# Patient Record
Sex: Female | Born: 1950
Health system: Southern US, Community
[De-identification: ages and names within clinical notes are randomized; demographics above are authoritative.]

## PROBLEM LIST (undated history)

## (undated) DIAGNOSIS — I82409 Acute embolism and thrombosis of unspecified deep veins of unspecified lower extremity: Secondary | ICD-10-CM

## (undated) DIAGNOSIS — M1990 Unspecified osteoarthritis, unspecified site: Secondary | ICD-10-CM

## (undated) DIAGNOSIS — I2699 Other pulmonary embolism without acute cor pulmonale: Secondary | ICD-10-CM

## (undated) DIAGNOSIS — E079 Disorder of thyroid, unspecified: Secondary | ICD-10-CM

## (undated) HISTORY — PX: CHOLECYSTECTOMY: SHX55

## (undated) HISTORY — PX: APPENDECTOMY: SHX54

## (undated) HISTORY — PX: OVARY SURGERY: SHX727

## (undated) HISTORY — PX: BACK SURGERY: SHX140

---

## 1998-06-15 ENCOUNTER — Ambulatory Visit (HOSPITAL_COMMUNITY): Admission: RE | Admit: 1998-06-15 | Discharge: 1998-06-15 | Payer: Self-pay | Admitting: *Deleted

## 1999-02-11 ENCOUNTER — Observation Stay (HOSPITAL_COMMUNITY): Admission: RE | Admit: 1999-02-11 | Discharge: 1999-02-12 | Payer: Self-pay | Admitting: *Deleted

## 1999-02-11 ENCOUNTER — Encounter (INDEPENDENT_AMBULATORY_CARE_PROVIDER_SITE_OTHER): Payer: Self-pay

## 2008-08-26 ENCOUNTER — Encounter
Admission: RE | Admit: 2008-08-26 | Discharge: 2008-08-26 | Payer: Self-pay | Admitting: Physical Medicine and Rehabilitation

## 2008-08-28 ENCOUNTER — Emergency Department (HOSPITAL_COMMUNITY): Admission: EM | Admit: 2008-08-28 | Discharge: 2008-08-29 | Payer: Self-pay | Admitting: Emergency Medicine

## 2013-12-16 DIAGNOSIS — E559 Vitamin D deficiency, unspecified: Secondary | ICD-10-CM | POA: Insufficient documentation

## 2013-12-16 DIAGNOSIS — L900 Lichen sclerosus et atrophicus: Secondary | ICD-10-CM | POA: Insufficient documentation

## 2013-12-16 DIAGNOSIS — M810 Age-related osteoporosis without current pathological fracture: Secondary | ICD-10-CM

## 2013-12-16 HISTORY — DX: Age-related osteoporosis without current pathological fracture: M81.0

## 2013-12-16 HISTORY — DX: Vitamin D deficiency, unspecified: E55.9

## 2013-12-16 HISTORY — DX: Lichen sclerosus et atrophicus: L90.0

## 2014-05-24 DIAGNOSIS — R030 Elevated blood-pressure reading, without diagnosis of hypertension: Secondary | ICD-10-CM

## 2014-05-24 DIAGNOSIS — K219 Gastro-esophageal reflux disease without esophagitis: Secondary | ICD-10-CM

## 2014-05-24 HISTORY — DX: Elevated blood-pressure reading, without diagnosis of hypertension: R03.0

## 2014-05-24 HISTORY — DX: Gastro-esophageal reflux disease without esophagitis: K21.9

## 2014-12-21 DIAGNOSIS — G8929 Other chronic pain: Secondary | ICD-10-CM | POA: Insufficient documentation

## 2015-03-15 DIAGNOSIS — R300 Dysuria: Secondary | ICD-10-CM | POA: Diagnosis not present

## 2015-05-28 DIAGNOSIS — J209 Acute bronchitis, unspecified: Secondary | ICD-10-CM | POA: Diagnosis not present

## 2015-05-28 DIAGNOSIS — J0100 Acute maxillary sinusitis, unspecified: Secondary | ICD-10-CM | POA: Diagnosis not present

## 2015-05-28 DIAGNOSIS — R069 Unspecified abnormalities of breathing: Secondary | ICD-10-CM | POA: Diagnosis not present

## 2015-06-30 DIAGNOSIS — R05 Cough: Secondary | ICD-10-CM | POA: Diagnosis not present

## 2015-06-30 DIAGNOSIS — E785 Hyperlipidemia, unspecified: Secondary | ICD-10-CM | POA: Diagnosis not present

## 2015-06-30 DIAGNOSIS — E079 Disorder of thyroid, unspecified: Secondary | ICD-10-CM | POA: Diagnosis not present

## 2015-06-30 DIAGNOSIS — R51 Headache: Secondary | ICD-10-CM | POA: Diagnosis not present

## 2015-06-30 DIAGNOSIS — R531 Weakness: Secondary | ICD-10-CM | POA: Diagnosis not present

## 2015-06-30 DIAGNOSIS — M810 Age-related osteoporosis without current pathological fracture: Secondary | ICD-10-CM | POA: Diagnosis not present

## 2015-06-30 DIAGNOSIS — R0789 Other chest pain: Secondary | ICD-10-CM | POA: Diagnosis not present

## 2015-06-30 DIAGNOSIS — K219 Gastro-esophageal reflux disease without esophagitis: Secondary | ICD-10-CM | POA: Diagnosis not present

## 2015-06-30 DIAGNOSIS — R079 Chest pain, unspecified: Secondary | ICD-10-CM | POA: Diagnosis not present

## 2015-06-30 DIAGNOSIS — Z79899 Other long term (current) drug therapy: Secondary | ICD-10-CM | POA: Diagnosis not present

## 2015-08-01 DIAGNOSIS — G8929 Other chronic pain: Secondary | ICD-10-CM | POA: Diagnosis not present

## 2015-08-01 DIAGNOSIS — E039 Hypothyroidism, unspecified: Secondary | ICD-10-CM | POA: Diagnosis not present

## 2015-08-01 DIAGNOSIS — R000 Tachycardia, unspecified: Secondary | ICD-10-CM

## 2015-08-01 DIAGNOSIS — M545 Low back pain: Secondary | ICD-10-CM | POA: Diagnosis not present

## 2015-08-01 DIAGNOSIS — R0609 Other forms of dyspnea: Secondary | ICD-10-CM

## 2015-08-01 DIAGNOSIS — R002 Palpitations: Secondary | ICD-10-CM | POA: Diagnosis not present

## 2015-08-01 DIAGNOSIS — R0600 Dyspnea, unspecified: Secondary | ICD-10-CM

## 2015-08-01 DIAGNOSIS — E559 Vitamin D deficiency, unspecified: Secondary | ICD-10-CM | POA: Diagnosis not present

## 2015-08-01 DIAGNOSIS — R350 Frequency of micturition: Secondary | ICD-10-CM | POA: Diagnosis not present

## 2015-08-01 HISTORY — DX: Tachycardia, unspecified: R00.0

## 2015-08-01 HISTORY — DX: Other forms of dyspnea: R06.09

## 2015-08-01 HISTORY — DX: Dyspnea, unspecified: R06.00

## 2015-09-19 DIAGNOSIS — R0609 Other forms of dyspnea: Secondary | ICD-10-CM | POA: Diagnosis not present

## 2015-09-19 DIAGNOSIS — R002 Palpitations: Secondary | ICD-10-CM | POA: Diagnosis not present

## 2015-09-24 DIAGNOSIS — R002 Palpitations: Secondary | ICD-10-CM | POA: Diagnosis not present

## 2015-10-09 DIAGNOSIS — R002 Palpitations: Secondary | ICD-10-CM | POA: Diagnosis not present

## 2015-10-09 DIAGNOSIS — R0609 Other forms of dyspnea: Secondary | ICD-10-CM | POA: Diagnosis not present

## 2015-10-25 DIAGNOSIS — J189 Pneumonia, unspecified organism: Secondary | ICD-10-CM | POA: Diagnosis not present

## 2015-10-25 DIAGNOSIS — R911 Solitary pulmonary nodule: Secondary | ICD-10-CM | POA: Insufficient documentation

## 2015-10-25 DIAGNOSIS — R000 Tachycardia, unspecified: Secondary | ICD-10-CM | POA: Diagnosis not present

## 2015-10-25 HISTORY — DX: Solitary pulmonary nodule: R91.1

## 2015-10-30 DIAGNOSIS — R300 Dysuria: Secondary | ICD-10-CM | POA: Diagnosis not present

## 2015-10-30 DIAGNOSIS — J209 Acute bronchitis, unspecified: Secondary | ICD-10-CM | POA: Diagnosis not present

## 2015-10-30 DIAGNOSIS — E039 Hypothyroidism, unspecified: Secondary | ICD-10-CM | POA: Diagnosis not present

## 2015-10-30 DIAGNOSIS — M7989 Other specified soft tissue disorders: Secondary | ICD-10-CM | POA: Diagnosis not present

## 2015-11-01 DIAGNOSIS — R0789 Other chest pain: Secondary | ICD-10-CM | POA: Insufficient documentation

## 2015-11-01 DIAGNOSIS — R000 Tachycardia, unspecified: Secondary | ICD-10-CM | POA: Diagnosis not present

## 2015-11-01 DIAGNOSIS — R002 Palpitations: Secondary | ICD-10-CM | POA: Insufficient documentation

## 2015-11-01 HISTORY — DX: Palpitations: R00.2

## 2015-11-01 HISTORY — DX: Other chest pain: R07.89

## 2015-11-06 DIAGNOSIS — R000 Tachycardia, unspecified: Secondary | ICD-10-CM | POA: Diagnosis not present

## 2015-11-06 DIAGNOSIS — E039 Hypothyroidism, unspecified: Secondary | ICD-10-CM | POA: Diagnosis not present

## 2015-11-06 DIAGNOSIS — S93601S Unspecified sprain of right foot, sequela: Secondary | ICD-10-CM | POA: Diagnosis not present

## 2015-11-06 DIAGNOSIS — R911 Solitary pulmonary nodule: Secondary | ICD-10-CM | POA: Diagnosis not present

## 2015-11-27 DIAGNOSIS — J0100 Acute maxillary sinusitis, unspecified: Secondary | ICD-10-CM | POA: Diagnosis not present

## 2015-11-29 DIAGNOSIS — Z9049 Acquired absence of other specified parts of digestive tract: Secondary | ICD-10-CM | POA: Diagnosis not present

## 2015-11-29 DIAGNOSIS — R911 Solitary pulmonary nodule: Secondary | ICD-10-CM | POA: Diagnosis not present

## 2015-11-29 DIAGNOSIS — I700 Atherosclerosis of aorta: Secondary | ICD-10-CM | POA: Diagnosis not present

## 2015-11-29 DIAGNOSIS — J9811 Atelectasis: Secondary | ICD-10-CM | POA: Diagnosis not present

## 2015-11-29 DIAGNOSIS — R05 Cough: Secondary | ICD-10-CM | POA: Diagnosis not present

## 2015-11-29 DIAGNOSIS — R0600 Dyspnea, unspecified: Secondary | ICD-10-CM | POA: Diagnosis not present

## 2015-11-29 DIAGNOSIS — R0689 Other abnormalities of breathing: Secondary | ICD-10-CM | POA: Diagnosis not present

## 2015-11-29 DIAGNOSIS — L905 Scar conditions and fibrosis of skin: Secondary | ICD-10-CM | POA: Diagnosis not present

## 2015-11-29 DIAGNOSIS — R918 Other nonspecific abnormal finding of lung field: Secondary | ICD-10-CM | POA: Diagnosis not present

## 2015-12-05 DIAGNOSIS — J42 Unspecified chronic bronchitis: Secondary | ICD-10-CM | POA: Diagnosis not present

## 2015-12-06 DIAGNOSIS — J410 Simple chronic bronchitis: Secondary | ICD-10-CM | POA: Diagnosis not present

## 2015-12-06 DIAGNOSIS — K219 Gastro-esophageal reflux disease without esophagitis: Secondary | ICD-10-CM | POA: Diagnosis not present

## 2015-12-06 DIAGNOSIS — R05 Cough: Secondary | ICD-10-CM | POA: Diagnosis not present

## 2015-12-06 DIAGNOSIS — R000 Tachycardia, unspecified: Secondary | ICD-10-CM | POA: Diagnosis not present

## 2015-12-06 DIAGNOSIS — R0789 Other chest pain: Secondary | ICD-10-CM | POA: Diagnosis not present

## 2015-12-07 DIAGNOSIS — R05 Cough: Secondary | ICD-10-CM | POA: Diagnosis not present

## 2015-12-07 DIAGNOSIS — J410 Simple chronic bronchitis: Secondary | ICD-10-CM | POA: Diagnosis not present

## 2015-12-07 DIAGNOSIS — J4521 Mild intermittent asthma with (acute) exacerbation: Secondary | ICD-10-CM | POA: Diagnosis not present

## 2015-12-07 DIAGNOSIS — J309 Allergic rhinitis, unspecified: Secondary | ICD-10-CM | POA: Diagnosis not present

## 2016-01-02 DIAGNOSIS — R000 Tachycardia, unspecified: Secondary | ICD-10-CM | POA: Diagnosis not present

## 2016-01-02 DIAGNOSIS — K219 Gastro-esophageal reflux disease without esophagitis: Secondary | ICD-10-CM | POA: Diagnosis not present

## 2016-01-02 DIAGNOSIS — R05 Cough: Secondary | ICD-10-CM | POA: Diagnosis not present

## 2016-01-02 DIAGNOSIS — R0789 Other chest pain: Secondary | ICD-10-CM | POA: Diagnosis not present

## 2016-01-02 DIAGNOSIS — J410 Simple chronic bronchitis: Secondary | ICD-10-CM | POA: Diagnosis not present

## 2016-01-05 DIAGNOSIS — J42 Unspecified chronic bronchitis: Secondary | ICD-10-CM | POA: Diagnosis not present

## 2016-02-04 DIAGNOSIS — J42 Unspecified chronic bronchitis: Secondary | ICD-10-CM | POA: Diagnosis not present

## 2016-03-06 DIAGNOSIS — J42 Unspecified chronic bronchitis: Secondary | ICD-10-CM | POA: Diagnosis not present

## 2016-04-06 DIAGNOSIS — J42 Unspecified chronic bronchitis: Secondary | ICD-10-CM | POA: Diagnosis not present

## 2016-05-01 DIAGNOSIS — L821 Other seborrheic keratosis: Secondary | ICD-10-CM | POA: Diagnosis not present

## 2016-05-04 DIAGNOSIS — J42 Unspecified chronic bronchitis: Secondary | ICD-10-CM | POA: Diagnosis not present

## 2016-05-10 DIAGNOSIS — R062 Wheezing: Secondary | ICD-10-CM | POA: Diagnosis not present

## 2016-05-10 DIAGNOSIS — J0100 Acute maxillary sinusitis, unspecified: Secondary | ICD-10-CM | POA: Diagnosis not present

## 2016-05-10 DIAGNOSIS — J218 Acute bronchiolitis due to other specified organisms: Secondary | ICD-10-CM | POA: Diagnosis not present

## 2016-05-28 DIAGNOSIS — H4303 Vitreous prolapse, bilateral: Secondary | ICD-10-CM | POA: Diagnosis not present

## 2016-06-04 DIAGNOSIS — J42 Unspecified chronic bronchitis: Secondary | ICD-10-CM | POA: Diagnosis not present

## 2016-06-27 DIAGNOSIS — M545 Low back pain: Secondary | ICD-10-CM | POA: Diagnosis not present

## 2016-06-27 DIAGNOSIS — M79641 Pain in right hand: Secondary | ICD-10-CM | POA: Diagnosis not present

## 2016-06-27 DIAGNOSIS — M79642 Pain in left hand: Secondary | ICD-10-CM | POA: Diagnosis not present

## 2016-06-27 DIAGNOSIS — G8929 Other chronic pain: Secondary | ICD-10-CM | POA: Diagnosis not present

## 2016-06-27 DIAGNOSIS — H43811 Vitreous degeneration, right eye: Secondary | ICD-10-CM | POA: Diagnosis not present

## 2016-06-27 DIAGNOSIS — M549 Dorsalgia, unspecified: Secondary | ICD-10-CM | POA: Diagnosis not present

## 2016-06-27 DIAGNOSIS — Z1231 Encounter for screening mammogram for malignant neoplasm of breast: Secondary | ICD-10-CM | POA: Diagnosis not present

## 2016-06-27 DIAGNOSIS — E559 Vitamin D deficiency, unspecified: Secondary | ICD-10-CM | POA: Diagnosis not present

## 2016-06-27 DIAGNOSIS — S32009S Unspecified fracture of unspecified lumbar vertebra, sequela: Secondary | ICD-10-CM | POA: Diagnosis not present

## 2016-06-27 DIAGNOSIS — M85842 Other specified disorders of bone density and structure, left hand: Secondary | ICD-10-CM | POA: Diagnosis not present

## 2016-06-27 DIAGNOSIS — M810 Age-related osteoporosis without current pathological fracture: Secondary | ICD-10-CM | POA: Diagnosis not present

## 2016-07-04 DIAGNOSIS — J42 Unspecified chronic bronchitis: Secondary | ICD-10-CM | POA: Diagnosis not present

## 2016-07-15 DIAGNOSIS — H524 Presbyopia: Secondary | ICD-10-CM | POA: Diagnosis not present

## 2016-07-15 DIAGNOSIS — H4303 Vitreous prolapse, bilateral: Secondary | ICD-10-CM | POA: Diagnosis not present

## 2016-07-15 DIAGNOSIS — H2513 Age-related nuclear cataract, bilateral: Secondary | ICD-10-CM | POA: Diagnosis not present

## 2016-08-04 DIAGNOSIS — J42 Unspecified chronic bronchitis: Secondary | ICD-10-CM | POA: Diagnosis not present

## 2016-09-03 DIAGNOSIS — J42 Unspecified chronic bronchitis: Secondary | ICD-10-CM | POA: Diagnosis not present

## 2016-09-15 DIAGNOSIS — H4303 Vitreous prolapse, bilateral: Secondary | ICD-10-CM | POA: Diagnosis not present

## 2016-10-04 DIAGNOSIS — J42 Unspecified chronic bronchitis: Secondary | ICD-10-CM | POA: Diagnosis not present

## 2016-11-04 DIAGNOSIS — J42 Unspecified chronic bronchitis: Secondary | ICD-10-CM | POA: Diagnosis not present

## 2016-12-04 DIAGNOSIS — J42 Unspecified chronic bronchitis: Secondary | ICD-10-CM | POA: Diagnosis not present

## 2016-12-12 DIAGNOSIS — H81399 Other peripheral vertigo, unspecified ear: Secondary | ICD-10-CM | POA: Diagnosis not present

## 2016-12-18 DIAGNOSIS — J0100 Acute maxillary sinusitis, unspecified: Secondary | ICD-10-CM | POA: Diagnosis not present

## 2017-02-06 DIAGNOSIS — R5383 Other fatigue: Secondary | ICD-10-CM | POA: Diagnosis not present

## 2017-02-06 DIAGNOSIS — J40 Bronchitis, not specified as acute or chronic: Secondary | ICD-10-CM | POA: Diagnosis not present

## 2017-02-06 DIAGNOSIS — Z6839 Body mass index (BMI) 39.0-39.9, adult: Secondary | ICD-10-CM | POA: Diagnosis not present

## 2017-02-06 DIAGNOSIS — E039 Hypothyroidism, unspecified: Secondary | ICD-10-CM | POA: Diagnosis not present

## 2017-02-26 ENCOUNTER — Emergency Department (HOSPITAL_COMMUNITY)
Admit: 2017-02-26 | Discharge: 2017-02-26 | Disposition: A | Discharge status: Home / Self Care | Payer: PPO | Attending: Emergency Medicine | Admitting: Emergency Medicine

## 2017-02-26 ENCOUNTER — Other Ambulatory Visit: Payer: Self-pay

## 2017-02-26 ENCOUNTER — Emergency Department (HOSPITAL_COMMUNITY): Payer: PPO

## 2017-02-26 ENCOUNTER — Encounter (HOSPITAL_COMMUNITY): Payer: Self-pay

## 2017-02-26 ENCOUNTER — Inpatient Hospital Stay (HOSPITAL_COMMUNITY)
Admission: EM | Admit: 2017-02-26 | Discharge: 2017-02-28 | DRG: 176 | Disposition: A | Discharge status: Home / Self Care | Payer: PPO | Attending: Internal Medicine | Admitting: Internal Medicine

## 2017-02-26 DIAGNOSIS — R601 Generalized edema: Secondary | ICD-10-CM | POA: Diagnosis not present

## 2017-02-26 DIAGNOSIS — Z885 Allergy status to narcotic agent status: Secondary | ICD-10-CM | POA: Diagnosis not present

## 2017-02-26 DIAGNOSIS — Z880 Allergy status to penicillin: Secondary | ICD-10-CM | POA: Diagnosis not present

## 2017-02-26 DIAGNOSIS — M1990 Unspecified osteoarthritis, unspecified site: Secondary | ICD-10-CM | POA: Diagnosis present

## 2017-02-26 DIAGNOSIS — I361 Nonrheumatic tricuspid (valve) insufficiency: Secondary | ICD-10-CM | POA: Diagnosis not present

## 2017-02-26 DIAGNOSIS — E039 Hypothyroidism, unspecified: Secondary | ICD-10-CM | POA: Diagnosis not present

## 2017-02-26 DIAGNOSIS — I2699 Other pulmonary embolism without acute cor pulmonale: Secondary | ICD-10-CM | POA: Diagnosis not present

## 2017-02-26 DIAGNOSIS — R600 Localized edema: Secondary | ICD-10-CM | POA: Diagnosis not present

## 2017-02-26 DIAGNOSIS — Z23 Encounter for immunization: Secondary | ICD-10-CM | POA: Diagnosis not present

## 2017-02-26 DIAGNOSIS — Z79890 Hormone replacement therapy (postmenopausal): Secondary | ICD-10-CM

## 2017-02-26 DIAGNOSIS — R002 Palpitations: Secondary | ICD-10-CM | POA: Diagnosis present

## 2017-02-26 DIAGNOSIS — M79609 Pain in unspecified limb: Secondary | ICD-10-CM | POA: Diagnosis not present

## 2017-02-26 DIAGNOSIS — R05 Cough: Secondary | ICD-10-CM | POA: Diagnosis not present

## 2017-02-26 DIAGNOSIS — Z79899 Other long term (current) drug therapy: Secondary | ICD-10-CM

## 2017-02-26 DIAGNOSIS — Z888 Allergy status to other drugs, medicaments and biological substances status: Secondary | ICD-10-CM | POA: Diagnosis not present

## 2017-02-26 DIAGNOSIS — R0602 Shortness of breath: Secondary | ICD-10-CM | POA: Diagnosis not present

## 2017-02-26 DIAGNOSIS — Z7982 Long term (current) use of aspirin: Secondary | ICD-10-CM | POA: Diagnosis not present

## 2017-02-26 HISTORY — DX: Disorder of thyroid, unspecified: E07.9

## 2017-02-26 HISTORY — DX: Hypothyroidism, unspecified: E03.9

## 2017-02-26 HISTORY — DX: Unspecified osteoarthritis, unspecified site: M19.90

## 2017-02-26 LAB — BASIC METABOLIC PANEL
Anion gap: 10 (ref 5–15)
BUN: 8 mg/dL (ref 6–20)
CALCIUM: 9.2 mg/dL (ref 8.9–10.3)
CO2: 24 mmol/L (ref 22–32)
CREATININE: 0.9 mg/dL (ref 0.44–1.00)
Chloride: 105 mmol/L (ref 101–111)
GFR calc non Af Amer: >60 mL/min (ref >60)
GLUCOSE: 95 mg/dL (ref 65–99)
Potassium: 4.1 mmol/L (ref 3.5–5.1)
Sodium: 139 mmol/L (ref 135–145)

## 2017-02-26 LAB — CBC WITH DIFFERENTIAL/PLATELET
BASOS PCT: 0 %
Basophils Absolute: 0 10*3/uL (ref 0.0–0.1)
Eosinophils Absolute: 0.2 10*3/uL (ref 0.0–0.7)
Eosinophils Relative: 3 %
HEMATOCRIT: 41.4 % (ref 36.0–46.0)
Hemoglobin: 13.9 g/dL (ref 12.0–15.0)
Lymphocytes Relative: 31 %
Lymphs Abs: 2.7 10*3/uL (ref 0.7–4.0)
MCH: 29.8 pg (ref 26.0–34.0)
MCHC: 33.6 g/dL (ref 30.0–36.0)
MCV: 88.7 fL (ref 78.0–100.0)
MONO ABS: 0.6 10*3/uL (ref 0.1–1.0)
MONOS PCT: 7 %
NEUTROS ABS: 5.1 10*3/uL (ref 1.7–7.7)
Neutrophils Relative %: 59 %
Platelets: 228 10*3/uL (ref 150–400)
RBC: 4.67 MIL/uL (ref 3.87–5.11)
RDW: 13.8 % (ref 11.5–15.5)
WBC: 8.6 10*3/uL (ref 4.0–10.5)

## 2017-02-26 LAB — I-STAT TROPONIN, ED: Troponin i, poc: 0 ng/mL (ref 0.00–0.08)

## 2017-02-26 LAB — D-DIMER, QUANTITATIVE: D-Dimer, Quant: 10.85 ug/mL-FEU — ABNORMAL HIGH (ref 0.00–0.50)

## 2017-02-26 MED ORDER — METOPROLOL SUCCINATE ER 25 MG PO TB24
25.0000 mg | ORAL_TABLET | Freq: Every day | ORAL | Status: DC
  Administered 2017-02-27 (×2): 25 mg via ORAL
  Filled 2017-02-26 (×2): qty 1

## 2017-02-26 MED ORDER — ALBUTEROL SULFATE (2.5 MG/3ML) 0.083% IN NEBU: 2.5000 mg | INHALATION_SOLUTION | RESPIRATORY_TRACT | Status: DC | PRN

## 2017-02-26 MED ORDER — FLUTICASONE PROPIONATE 50 MCG/ACT NA SUSP: 2.0000 | Freq: Every evening | NASAL | Status: DC | PRN

## 2017-02-26 MED ORDER — MECLIZINE HCL 12.5 MG PO TABS: 25.0000 mg | ORAL_TABLET | Freq: Three times a day (TID) | ORAL | Status: DC | PRN

## 2017-02-26 MED ORDER — LEVOTHYROXINE SODIUM 50 MCG PO TABS
50.0000 ug | ORAL_TABLET | ORAL | Status: DC
  Administered 2017-02-27: 50 ug via ORAL
  Filled 2017-02-26: qty 1

## 2017-02-26 MED ORDER — IOPAMIDOL (ISOVUE-370) INJECTION 76%
INTRAVENOUS | Status: AC
  Administered 2017-02-26: 100 mL
  Filled 2017-02-26: qty 100

## 2017-02-26 MED ORDER — ACETAMINOPHEN 325 MG PO TABS
650.0000 mg | ORAL_TABLET | Freq: Four times a day (QID) | ORAL | Status: DC | PRN
  Administered 2017-02-27 (×2): 650 mg via ORAL
  Filled 2017-02-26 (×2): qty 2

## 2017-02-26 MED ORDER — HEPARIN BOLUS VIA INFUSION
4300.0000 [IU] | Freq: Once | INTRAVENOUS | Status: AC
  Administered 2017-02-26: 4300 [IU] via INTRAVENOUS
  Filled 2017-02-26: qty 4300

## 2017-02-26 MED ORDER — LEVOTHYROXINE SODIUM 75 MCG PO TABS
75.0000 ug | ORAL_TABLET | ORAL | Status: DC
  Administered 2017-02-28: 75 ug via ORAL
  Filled 2017-02-26: qty 1

## 2017-02-26 MED ORDER — HEPARIN (PORCINE) IN NACL 100-0.45 UNIT/ML-% IJ SOLN
1400.0000 [IU]/h | INTRAMUSCULAR | Status: DC
  Administered 2017-02-26 – 2017-02-28 (×3): 1400 [IU]/h via INTRAVENOUS
  Filled 2017-02-26 (×3): qty 250

## 2017-02-26 MED ORDER — INFLUENZA VAC SPLIT HIGH-DOSE 0.5 ML IM SUSY
0.5000 mL | PREFILLED_SYRINGE | INTRAMUSCULAR | Status: DC
  Filled 2017-02-26: qty 0.5

## 2017-02-26 MED ORDER — METHOCARBAMOL 500 MG PO TABS: 750.0000 mg | ORAL_TABLET | Freq: Three times a day (TID) | ORAL | Status: DC | PRN

## 2017-02-26 MED ORDER — PANTOPRAZOLE SODIUM 40 MG PO TBEC
40.0000 mg | DELAYED_RELEASE_TABLET | Freq: Every day | ORAL | Status: DC
  Administered 2017-02-27 – 2017-02-28 (×2): 40 mg via ORAL
  Filled 2017-02-26 (×2): qty 1

## 2017-02-26 MED ORDER — ONDANSETRON HCL 4 MG PO TABS: 4.0000 mg | ORAL_TABLET | Freq: Four times a day (QID) | ORAL | Status: DC | PRN

## 2017-02-26 MED ORDER — ACETAMINOPHEN 650 MG RE SUPP: 650.0000 mg | Freq: Four times a day (QID) | RECTAL | Status: DC | PRN

## 2017-02-26 MED ORDER — ONDANSETRON HCL 4 MG/2ML IJ SOLN: 4.0000 mg | Freq: Four times a day (QID) | INTRAMUSCULAR | Status: DC | PRN

## 2017-02-26 NOTE — ED Provider Notes (Signed)
[facility] EMERGENCY DEPARTMENT Provider Note   CSN: [id number] Arrival date & time: 02/26/17  1623   History   Chief Complaint Chief Complaint  Patient presents with  . Shortness of Breath    HPI Gail Olson is a 66 y.o. female.  The history is provided by the patient.  Shortness of Breath  This is a chronic problem. The average episode lasts 5 weeks. The problem occurs continuously.The problem has been gradually worsening. Associated symptoms include chest pain, leg pain and leg swelling. Pertinent negatives include no fever, no rhinorrhea, no sore throat, no cough, no vomiting, no abdominal pain and no rash. It is unknown what precipitated the problem. She has tried nothing for the symptoms. She has had prior ED visits. Associated medical issues do not include COPD, chronic lung disease, PE, CAD, heart failure, past MI, DVT or recent surgery.    Past Medical History:  Diagnosis Date  . Arthritis   . Thyroid disease     Patient Active Problem List   Diagnosis Date Noted  . Pulmonary embolism (HCC) 02/26/2017    Past Surgical History:  Procedure Laterality Date  . APPENDECTOMY    . BACK SURGERY    . CHOLECYSTECTOMY      OB History    No data available       Home Medications    Prior to Admission medications   Medication Sig Start Date End Date Taking? Authorizing Provider  acetaminophen (TYLENOL) 650 MG CR tablet Take 650 mg by mouth every 8 (eight) hours as needed for pain.   Yes [provider]  albuterol (PROAIR HFA) 108 (90 Base) MCG/ACT inhaler Inhale 2 puffs into the lungs every 6 (six) hours as needed for wheezing or shortness of breath.   Yes [provider]  albuterol (PROVENTIL) (2.5 MG/3ML) 0.083% nebulizer solution Take 2.5 mg by nebulization every 4 (four) hours as needed for wheezing or shortness of breath (AS DIRECTED BY MD).  12/04/15  Yes [provider]  aspirin EC 81 MG tablet Take 81 mg by  mouth at bedtime.   Yes [provider]  betamethasone dipropionate (DIPROLENE) 0.05 % ointment Apply 1 application topically 2 (two) times daily. aaa 01/09/17  Yes [provider]  fluticasone (FLONASE) 50 MCG/ACT nasal spray Place 2 sprays into both nostrils at bedtime as needed for allergies or rhinitis.  09/08/16  Yes [provider]  ibuprofen (ADVIL,MOTRIN) 200 MG tablet Take 400 mg by mouth every 6 (six) hours as needed (for pain).   Yes [provider]  levothyroxine (SYNTHROID, LEVOTHROID) 50 MCG tablet Take 50 mcg by mouth every Monday, Wednesday, and Friday. 10/06/16  Yes [provider]  levothyroxine (SYNTHROID, LEVOTHROID) 75 MCG tablet Take 75 mcg by mouth every Tuesday, Thursday, Saturday, and Sunday. 10/29/15  Yes [provider]  meclizine (ANTIVERT) 25 MG tablet Take 25 mg by mouth 3 (three) times daily as needed for dizziness.  12/13/16  Yes [provider]  methocarbamol (ROBAXIN) 750 MG tablet Take 750 mg by mouth 3 (three) times daily as needed for muscle spasms.  06/27/16  Yes [provider]  metoprolol succinate (TOPROL-XL) 25 MG 24 hr tablet Take 25 mg by mouth at bedtime.  02/09/17  Yes [provider]  OVER THE COUNTER MEDICATION Remefemin tablets: Take 1 tablet by mouth two to three times a week   Yes [provider]  omeprazole (PRILOSEC) 20 MG capsule Take 20 mg by mouth daily  as needed. 02/06/17   [provider]    Family History History reviewed. No pertinent family history.  Social History Social History   Tobacco Use  . Smoking status: Never Smoker  . Smokeless tobacco: Never Used  Substance Use Topics  . Alcohol use: Not on file  . Drug use: Not on file     Allergies   Other; Prednisone; Risedronate; Codeine; and Penicillins   Review of Systems Review of Systems  Constitutional: Negative for chills and fever.  HENT: Negative for rhinorrhea and sore  throat.   Eyes: Negative for visual disturbance.  Respiratory: Positive for shortness of breath. Negative for cough.   Cardiovascular: Positive for chest pain and leg swelling. Negative for palpitations.  Gastrointestinal: Negative for abdominal pain and vomiting.  Genitourinary: Negative for dysuria.  Musculoskeletal: Positive for myalgias (LLE). Negative for arthralgias and joint swelling.  Skin: Negative for color change and rash.  Allergic/Immunologic: Negative for immunocompromised state.  Neurological: Negative for syncope and light-headedness.  All other systems reviewed and are negative.  Physical Exam Updated Vital Signs BP 128/73   Pulse 95   Temp (!) 97.5 F (36.4 C) (Oral)   Resp 12   Ht 5\' 6"  (1.676 m)   Wt 108.9 kg (240 lb)   SpO2 95%   BMI 38.74 kg/m   Physical Exam  Constitutional: She is oriented to person, place, and time. She appears well-developed and well-nourished. No distress.  HENT:  Head: Normocephalic and atraumatic.  Eyes: Conjunctivae are normal.  Neck: Normal range of motion. Neck supple.  Cardiovascular: Normal rate and regular rhythm.  No murmur heard. Pulmonary/Chest: Effort normal and breath sounds normal. No respiratory distress.  Abdominal: Soft. There is no tenderness.  Musculoskeletal: She exhibits no deformity.  Mild swelling of the LLE when compared to the RLE, TTP about left calf, no overlying erythema or other signs of infection  Neurological: She is alert and oriented to person, place, and time.  Skin: Skin is warm and dry.  Psychiatric: She has a normal mood and affect.  Nursing note and vitals reviewed.    ED Treatments / Results  Labs (all labs ordered are listed, but only abnormal results are displayed) Labs Reviewed  D-DIMER, QUANTITATIVE (NOT AT [facility]) - Abnormal; Notable for the following components:      Result Value   D-Dimer, Quant 10.85 (*)    All other components within normal limits  CBC WITH  DIFFERENTIAL/PLATELET  BASIC METABOLIC PANEL  CBC  HEPARIN LEVEL (UNFRACTIONATED)  I-STAT TROPONIN, ED    EKG  EKG Interpretation  Date/Time:  Thursday February 26 2017 16:33:29 EST Ventricular Rate:  97 PR Interval:  130 QRS Duration: 80 QT Interval:  352 QTC Calculation: 447 R Axis:   -42 Text Interpretation:  Normal sinus rhythm Left axis deviation Abnormal ECG No STEMI.  Confirmed by Alona BeneLong, Joshua [phone]([id number]) on 02/26/2017 5:37:07 PM       Radiology Ct Angio Chest Pe W And/or Wo Contrast  Result Date: 02/26/2017 CLINICAL DATA:  Shortness of Breath EXAM: CT ANGIOGRAPHY CHEST WITH CONTRAST TECHNIQUE: Multidetector CT imaging of the chest was performed using the standard protocol during bolus administration of intravenous contrast. Multiplanar CT image reconstructions and MIPs were obtained to evaluate the vascular anatomy. CONTRAST:  56 mL ISOVUE-370 IOPAMIDOL (ISOVUE-370) INJECTION 76% COMPARISON:  Chest radiograph November 06, 2015 and chest CT November 29, 2015 FINDINGS: Cardiovascular: There is pulmonary embolus arising from each lateral main pulmonary artery with extension into multiple upper  and lower lobe pulmonary arterial branches. The right ventricle to left ventricle diameter ratio is 1.2, a finding indicative of a degree of right heart strain. There is no appreciable thoracic aortic aneurysm or dissection. Visualized great vessels appear normal. There are foci of aortic atherosclerosis. There is no appreciable pericardial effusion or pericardial thickening. Mediastinum/Nodes: Thyroid appears normal. There are subcentimeter mediastinal lymph nodes. There is no adenopathy by size criteria. There is a small hiatal hernia. Lungs/Pleura: No edema or consolidation. There is a persistent somewhat mosaic pattern of lung attenuation consistent with small airways obstructive disease as well as patchy atelectasis. No pleural effusion or pleural thickening evident. Upper Abdomen: Gallbladder  is absent. Visualized upper abdominal structures otherwise appear unremarkable. Musculoskeletal: Patient has had kyphoplasty procedures in the upper lumbar region. There is degenerative change with vacuum phenomenon at several levels in the lower thoracic and upper lumbar regions. There are no blastic or lytic bone lesions. Review of the MIP images confirms the above findings. IMPRESSION: 1. Pulmonary emboli bilaterally arising from each distal main pulmonary artery extending into multiple upper lobe and lower lobe pulmonary arterial branches bilaterally. Positive for acute PE with CT evidence of right heart strain (RV/LV Ratio = 1.2) consistent with at least submassive (intermediate risk) PE. The presence of right heart strain has been associated with an increased risk of morbidity and mortality. Please activate Code PE by paging [phone]. 2. Somewhat mosaic pattern of attenuation in the lung parenchyma. Suspect a combination of atelectasis and small airways obstructive disease. No edema or consolidation. 3.  No evident adenopathy. 4.  Mild aortic atherosclerosis. 5.  Gallbladder absent. Aortic Atherosclerosis (ICD10-I70.0). Critical Value/emergent results were called by telephone at the time of interpretation on 02/26/2017 at 7:16 pm to Dr. Alona BeneJOSHUA LONG , who verbally acknowledged these results. Electronically Signed   By: Bretta BangWilliam  Woodruff III M.D.   On: 02/26/2017 19:17    Procedures Procedures (including critical care time)  Medications Ordered in ED Medications  heparin ADULT infusion 100 units/mL (25000 units/23650mL sodium chloride 0.45%) (1,400 Units/hr Intravenous New Bag/Given 02/26/17 2012)  iopamidol (ISOVUE-370) 76 % injection (100 mLs  Contrast Given 02/26/17 1850)  heparin bolus via infusion 4,300 Units (4,300 Units Intravenous Bolus from Bag 02/26/17 2012)     Initial Impression / Assessment and Plan / ED Course  I have reviewed the triage vital signs and the nursing  notes.  Pertinent labs & imaging results that were available during my care of the patient were reviewed by me and considered in my medical decision making (see chart for details).     Pt with h/o arthritis, hypothyroidism presents with w/SOB. Pt says she has been having worsening SOB for the last 5wks; 3wks ago she went to her PCP & had normal blood work but didn't get a CXR. Her symptoms persisted, so she went to a UC today where she also mentioned some LLE swelling and calf pain, so she was sent to an OSH for evaluation; she had bloodwork drawn & reportedly had an elevated d-dimer, so was told to come here for her CT scan (something to do with insurance). Pt also notes that she's had intermittent SSCP during this period of time, but notes that this is not uncommon for her; nor is the LLE swelling or pain, which she says occurs not infrequently w/o any obvious trigger.  VS & exam as above. EKG: NSR @ 97bpm w/LAD, but no signs of right heart strain or ischemia. Labs remarkable for d-dimer 10.85 &  undetectable troponin.  CTA w/b/l PE arrising from the distal main arteries extending into multiple branches.Heparin per Pharmacy ordered.  Called CCM for CODE PE; Intensivist said the Pt is not a candidate for thrombolysis or thrombectomy. Recommends heparin & admission to medicine.  Will admit the Pt to the Hospitalist for further evaluation and treatment.  Final Clinical Impressions(s) / ED Diagnoses   Final diagnoses:  Other acute pulmonary embolism without acute [facility] pulmon[facility])    ED Discharge Orders    None       Forest Becker, MD 02/26/17 2055    Maia Plan, MD 02/27/17 [phone]

## 2017-02-26 NOTE — ED Notes (Signed)
Attempted to call report

## 2017-02-26 NOTE — Progress Notes (Signed)
ANTICOAGULATION CONSULT NOTE - Initial Consult  Pharmacy Consult for heparin Indication: pulmonary embolus  Allergies  Allergen Reactions  . Codeine Other (See Comments)    tachycardia  . Penicillins Rash    Patient Measurements: Height: 5\' 6"  (167.6 cm) Weight: 240 lb (108.9 kg) IBW/kg (Calculated) : 59.3 Heparin Dosing Weight: 84.5 kg  Vital Signs: Temp: 97.5 F (36.4 C) (12/27 1634) Temp Source: Oral (12/27 1634) BP: 142/89 (12/27 1830) Pulse Rate: 96 (12/27 1907)  Labs: Recent Labs    02/26/17 1646  HGB 13.9  HCT 41.4  PLT 228  CREATININE 0.90    Estimated Creatinine Clearance: 74.7 mL/min (by C-G formula based on SCr of 0.9 mg/dL).   Medical History: Past Medical History:  Diagnosis Date  . Arthritis   . Thyroid disease     Medications:  Scheduled:  . heparin  4,300 Units Intravenous Once    Assessment: [age] yof admitted with positive D-dimer of 10.85 and leg pain with increasing shortness of breath. CTA showing bilateral pulmonary emboli with right heart strain (RV/LV ratio=1.2). CBC is stable. No signs/symptoms of bleeding. No prior to admission anticoagulation.   Goal of Therapy:  Heparin level 0.3-0.7 units/ml Monitor platelets by anticoagulation protocol: Yes   Plan:  Give 4300 units bolus x 1 Start heparin infusion at 1400 units/hr Check anti-Xa level in 6 hours and daily while on heparin Continue to monitor H&H and platelets  Girard CooterKimberly Perkins, PharmD Clinical Pharmacist  Pager: [phone] Phone: [phone] 02/26/2017,7:55 PM

## 2017-02-26 NOTE — ED Triage Notes (Signed)
Pt presents with 2 week h/o shortness of breath and L calf pain.  Pt reports possible twisting injury to L leg, reports pain to leg resolved on it's own but reports increased shortness of breath and chest discomfort that began x 1 hour ago.  Pt was seen at urgent care [facility], pt was referred here with elevated d-dimer.

## 2017-02-26 NOTE — Progress Notes (Signed)
Attempted LE venous duplex at 6:30 and 6:45. Patient not in room both times- still in CT. Called CT at 6:55 and patient is still there having test. Will not be able to complete venous duplex at this time. Irving BurtonEmily[facility] is aware.   Farrel DemarkJill Eunice, RDMS, RVT

## 2017-02-26 NOTE — H&P (Signed)
History and Physical    Gail DullBrenda H Dipiero ZOX:[id number] DOB: [birth year] DOA: 02/26/2017  PCP: System, Pcp Not In  Patient coming from: Home.  Chief Complaint: Shortness of breath.  HPI: Gail Olson is a 66 y.o. female with history of bronchitis, palpitations on metoprolol, hypothyroidism has been experiencing exertional shortness of breath for last 5-6 weeks.  Patient also has been having chronic lower extremity edema.  Last 1 week patient noticed increased not like feeling in the left posterior knee.  Denies any recent travel or surgeries.  Patient usually ambulates with the help of a cane.  Today patient did develop some retrosternal pleuritic type of chest pain.  Patient went to the urgent care center and was referred to the ER concerning for DVT and PE.  ED Course: In the ER patient's d-dimer was elevated at 10 and underwent CT angiogram of the chest which showed bilateral pulmonary embolism with strain pattern.  Patient remained hemodynamically stable and was found in distress.  Patient was started on IV heparin.  And admitted for acute pulmonary embolism.  Review of Systems: As per HPI, rest all negative.   Past Medical History:  Diagnosis Date  . Arthritis   . Thyroid disease     Past Surgical History:  Procedure Laterality Date  . APPENDECTOMY    . BACK SURGERY    . CHOLECYSTECTOMY       reports that  has never smoked. she has never used smokeless tobacco. Her alcohol and drug histories are not on file.  Allergies  Allergen Reactions  . Other Other (See Comments)    Extreme stomach pain if any of these are eaten: spicy foods, onions, fried foods, cauliflower, and broccoli  . Prednisone Other (See Comments)    Not suppose to take due to advanced osteoporosis  . Risedronate Other (See Comments)    (Actonel) Caused EXTREME JAW PAIN and muscles and joints ached   . Codeine Palpitations and Other (See Comments)    Tachycardia   . Penicillins Rash    Has patient  had a PCN reaction causing immediate rash, facial/tongue/throat swelling, SOB or lightheadedness with hypotension: Yes Has patient had a PCN reaction causing severe rash involving mucus membranes or skin necrosis: Unk Has patient had a PCN reaction that required hospitalization: Unk Has patient had a PCN reaction occurring within the last 10 years: Unk If all of the above answers are "NO", then may proceed with Cephalosporin use.     Family History  Problem Relation Age of Onset  . Pulmonary disease Neg Hx     Prior to Admission medications   Medication Sig Start Date End Date Taking? Authorizing Provider  acetaminophen (TYLENOL) 650 MG CR tablet Take 650 mg by mouth every 8 (eight) hours as needed for pain.   Yes [provider]  albuterol (PROAIR HFA) 108 (90 Base) MCG/ACT inhaler Inhale 2 puffs into the lungs See admin instructions. 2 puffs every four to six hours as needed for shortness of breath   Yes [provider]  albuterol (PROVENTIL) (2.5 MG/3ML) 0.083% nebulizer solution Take 2.5 mg by nebulization every 4 (four) hours as needed for wheezing or shortness of breath (AS DIRECTED BY MD).  12/04/15  Yes [provider]  aspirin EC 81 MG tablet Take 81 mg by mouth at bedtime.   Yes [provider]  betamethasone dipropionate (DIPROLENE) 0.05 % ointment Apply 1 application topically 2 (two) times daily. aaa 01/09/17  Yes [provider]  Dextromethorphan-Guaifenesin (  CORICIDIN HBP CONGESTION/COUGH) 10-200 MG CAPS Take 1 capsule by mouth every 8 (eight) hours as needed (for cold-like symptoms).   Yes [provider]  fluticasone (FLONASE) 50 MCG/ACT nasal spray Place 2 sprays into both nostrils at bedtime as needed for allergies or rhinitis.  09/08/16  Yes [provider]  ibuprofen (ADVIL,MOTRIN) 200 MG tablet Take 400 mg by mouth every 6 (six) hours as needed (for pain).   Yes [provider]  levothyroxine (SYNTHROID,  LEVOTHROID) 50 MCG tablet Take 50 mcg by mouth every Monday, Wednesday, and Friday. 10/06/16  Yes [provider]  levothyroxine (SYNTHROID, LEVOTHROID) 75 MCG tablet Take 75 mcg by mouth every Tuesday, Thursday, Saturday, and Sunday. 10/29/15  Yes [provider]  meclizine (ANTIVERT) 25 MG tablet Take 25 mg by mouth 3 (three) times daily as needed for dizziness.  12/13/16  Yes [provider]  methocarbamol (ROBAXIN) 750 MG tablet Take 750 mg by mouth 3 (three) times daily as needed for muscle spasms.  06/27/16  Yes [provider]  metoprolol succinate (TOPROL-XL) 25 MG 24 hr tablet Take 25 mg by mouth at bedtime.  02/09/17  Yes [provider]  omeprazole (PRILOSEC) 20 MG capsule Take 20 mg by mouth daily as needed (for reflux).  02/06/17  Yes [provider]  OVER THE COUNTER MEDICATION Remefemin tablets: Take 1 tablet by mouth two to three times a week   Yes [provider]  budesonide-formoterol (SYMBICORT) 160-4.5 MCG/ACT inhaler Inhale 2 puffs into the lungs 2 (two) times daily.    [provider]    Physical Exam: Vitals:   02/26/17 2000 02/26/17 2100 02/26/17 2115 02/26/17 2209  BP: 128/73 122/81    Pulse: 95 91 92   Resp: 12 (!) 25 14   Temp:    98.3 F (36.8 C)  TempSrc:    Oral  SpO2: 95% 93% 95%   Weight:    108 kg (238 lb 1.6 oz)  Height:          Constitutional: Moderately built and nourished. Vitals:   02/26/17 2000 02/26/17 2100 02/26/17 2115 02/26/17 2209  BP: 128/73 122/81    Pulse: 95 91 92   Resp: 12 (!) 25 14   Temp:    98.3 F (36.8 C)  TempSrc:    Oral  SpO2: 95% 93% 95%   Weight:    108 kg (238 lb 1.6 oz)  Height:       Eyes: Anicteric no pallor. ENMT: No discharge from the ears eyes nose or mouth. Neck: No mass felt.  No neck rigidity no JVD appreciated. Respiratory: No rhonchi or crepitations. Cardiovascular: S1-S2 heard no murmurs appreciated. Abdomen: Soft nontender bowel  sounds present.  No guarding or rigidity. Musculoskeletal: Bilateral lower extremity edema. Skin: No rash. Neurologic: Alert awake oriented to time place and person.  Moves all extremities 5 x 5. Psychiatric: Appears normal.  Normal affect.   Labs on Admission: I have personally reviewed following labs and imaging studies  CBC: Recent Labs  Lab 02/26/17 1646  WBC 8.6  NEUTROABS 5.1  HGB 13.9  HCT 41.4  MCV 88.7  PLT 228   Basic Metabolic Panel: Recent Labs  Lab 02/26/17 1646  NA 139  K 4.1  CL 105  CO2 24  GLUCOSE 95  BUN 8  CREATININE 0.90  CALCIUM 9.2   GFR: Estimated Creatinine Clearance: 74.4 mL/min (by C-G formula based on SCr of 0.9 mg/dL). Liver Function Tests: No results  for input(s): AST, ALT, ALKPHOS, BILITOT, PROT, ALBUMIN in the last 168 hours. No results for input(s): LIPASE, AMYLASE in the last 168 hours. No results for input(s): AMMONIA in the last 168 hours. Coagulation Profile: No results for input(s): INR, PROTIME in the last 168 hours. Cardiac Enzymes: No results for input(s): CKTOTAL, CKMB, CKMBINDEX, TROPONINI in the last 168 hours. BNP (last 3 results) No results for input(s): PROBNP in the last 8760 hours. HbA1C: No results for input(s): HGBA1C in the last 72 hours. CBG: No results for input(s): GLUCAP in the last 168 hours. Lipid Profile: No results for input(s): CHOL, HDL, LDLCALC, TRIG, CHOLHDL, LDLDIRECT in the last 72 hours. Thyroid Function Tests: No results for input(s): TSH, T4TOTAL, FREET4, T3FREE, THYROIDAB in the last 72 hours. Anemia Panel: No results for input(s): VITAMINB12, FOLATE, FERRITIN, TIBC, IRON, RETICCTPCT in the last 72 hours. Urine analysis: No results found for: COLORURINE, APPEARANCEUR, LABSPEC, PHURINE, GLUCOSEU, HGBUR, BILIRUBINUR, KETONESUR, PROTEINUR, UROBILINOGEN, NITRITE, LEUKOCYTESUR Sepsis Labs: @LABRCNTIP (procalcitonin:4,lacticidven:4) )No results found for this or any previous visit (from the past  240 hour(s)).   Radiological Exams on Admission: Ct Angio Chest Pe W And/or Wo Contrast  Result Date: 02/26/2017 CLINICAL DATA:  Shortness of Breath EXAM: CT ANGIOGRAPHY CHEST WITH CONTRAST TECHNIQUE: Multidetector CT imaging of the chest was performed using the standard protocol during bolus administration of intravenous contrast. Multiplanar CT image reconstructions and MIPs were obtained to evaluate the vascular anatomy. CONTRAST:  56 mL ISOVUE-370 IOPAMIDOL (ISOVUE-370) INJECTION 76% COMPARISON:  Chest radiograph November 06, 2015 and chest CT November 29, 2015 FINDINGS: Cardiovascular: There is pulmonary embolus arising from each lateral main pulmonary artery with extension into multiple upper and lower lobe pulmonary arterial branches. The right ventricle to left ventricle diameter ratio is 1.2, a finding indicative of a degree of right heart strain. There is no appreciable thoracic aortic aneurysm or dissection. Visualized great vessels appear normal. There are foci of aortic atherosclerosis. There is no appreciable pericardial effusion or pericardial thickening. Mediastinum/Nodes: Thyroid appears normal. There are subcentimeter mediastinal lymph nodes. There is no adenopathy by size criteria. There is a small hiatal hernia. Lungs/Pleura: No edema or consolidation. There is a persistent somewhat mosaic pattern of lung attenuation consistent with small airways obstructive disease as well as patchy atelectasis. No pleural effusion or pleural thickening evident. Upper Abdomen: Gallbladder is absent. Visualized upper abdominal structures otherwise appear unremarkable. Musculoskeletal: Patient has had kyphoplasty procedures in the upper lumbar region. There is degenerative change with vacuum phenomenon at several levels in the lower thoracic and upper lumbar regions. There are no blastic or lytic bone lesions. Review of the MIP images confirms the above findings. IMPRESSION: 1. Pulmonary emboli bilaterally  arising from each distal main pulmonary artery extending into multiple upper lobe and lower lobe pulmonary arterial branches bilaterally. Positive for acute PE with CT evidence of right heart strain (RV/LV Ratio = 1.2) consistent with at least submassive (intermediate risk) PE. The presence of right heart strain has been associated with an increased risk of morbidity and mortality. Please activate Code PE by paging [phone]. 2. Somewhat mosaic pattern of attenuation in the lung parenchyma. Suspect a combination of atelectasis and small airways obstructive disease. No edema or consolidation. 3.  No evident adenopathy. 4.  Mild aortic atherosclerosis. 5.  Gallbladder absent. Aortic Atherosclerosis (ICD10-I70.0). Critical Value/emergent results were called by telephone at the time of interpretation on 02/26/2017 at 7:16 pm to Dr. Alona Bene , who verbally acknowledged these results. Electronically Signed  By: Bretta BangWilliam  Woodruff III M.D.   On: 02/26/2017 19:17    EKG: Independently reviewed.  Normal sinus rhythm.  Assessment/Plan Principal Problem:   Pulmonary embolism (HCC) Active Problems:   Hypothyroidism    1. Acute bilateral pulmonary embolism with strain pattern, unprovoked hemodynamically stable -patient has been started on heparin infusion.  If continues to remain stable will change to oral anticoagulants.  Check 2D echo Dopplers of the lower extremities.  Cycle cardiac markers check BNP and lactic acid levels. 2. History of bronchitis we will continue as needed albuterol nebulizer with Flovent.  Presently not actively wheezing. 3. Hypothyroidism on Synthroid. 4. History of palpitations on metoprolol.   DVT prophylaxis: Heparin. Code Status: Full code. Family Communication: Discussed with patient. Disposition Plan: Home. Consults called: None. Admission status: Inpatient.   Eduard ClosArshad N Titan Karner MD [facility] Pager [phone].  If 7PM-7AM, please contact  night-coverage [email or web address] Password [email or web address]  02/26/2017, 11:20 PM

## 2017-02-27 ENCOUNTER — Inpatient Hospital Stay (HOSPITAL_COMMUNITY): Payer: PPO

## 2017-02-27 DIAGNOSIS — I361 Nonrheumatic tricuspid (valve) insufficiency: Secondary | ICD-10-CM

## 2017-02-27 DIAGNOSIS — E039 Hypothyroidism, unspecified: Secondary | ICD-10-CM

## 2017-02-27 DIAGNOSIS — I2699 Other pulmonary embolism without acute cor pulmonale: Principal | ICD-10-CM

## 2017-02-27 DIAGNOSIS — M79609 Pain in unspecified limb: Secondary | ICD-10-CM

## 2017-02-27 LAB — CBC
HEMATOCRIT: 38.2 % (ref 36.0–46.0)
HEMOGLOBIN: 12.4 g/dL (ref 12.0–15.0)
MCH: 29.1 pg (ref 26.0–34.0)
MCHC: 32.5 g/dL (ref 30.0–36.0)
MCV: 89.7 fL (ref 78.0–100.0)
Platelets: 204 10*3/uL (ref 150–400)
RBC: 4.26 MIL/uL (ref 3.87–5.11)
RDW: 14.2 % (ref 11.5–15.5)
WBC: 7.2 10*3/uL (ref 4.0–10.5)

## 2017-02-27 LAB — BASIC METABOLIC PANEL
ANION GAP: 9 (ref 5–15)
BUN: 8 mg/dL (ref 6–20)
CALCIUM: 8.7 mg/dL — AB (ref 8.9–10.3)
CO2: 22 mmol/L (ref 22–32)
Chloride: 107 mmol/L (ref 101–111)
Creatinine, Ser: 0.77 mg/dL (ref 0.44–1.00)
Glucose, Bld: 100 mg/dL — ABNORMAL HIGH (ref 65–99)
POTASSIUM: 3.5 mmol/L (ref 3.5–5.1)
Sodium: 138 mmol/L (ref 135–145)

## 2017-02-27 LAB — BRAIN NATRIURETIC PEPTIDE: B Natriuretic Peptide: 13.9 pg/mL (ref 0.0–100.0)

## 2017-02-27 LAB — ECHOCARDIOGRAM COMPLETE
Height: 66 in
Weight: 3809.55 oz

## 2017-02-27 LAB — TROPONIN I: Troponin I: <0.03 ng/mL (ref <0.03)

## 2017-02-27 LAB — LACTIC ACID, PLASMA: Lactic Acid, Venous: 0.9 mmol/L (ref 0.5–1.9)

## 2017-02-27 LAB — MRSA PCR SCREENING: MRSA by PCR: NEGATIVE

## 2017-02-27 LAB — HEPARIN LEVEL (UNFRACTIONATED)
HEPARIN UNFRACTIONATED: 0.64 [IU]/mL (ref 0.30–0.70)
Heparin Unfractionated: 0.56 IU/mL (ref 0.30–0.70)

## 2017-02-27 MED ORDER — PNEUMOCOCCAL VAC POLYVALENT 25 MCG/0.5ML IJ INJ
0.5000 mL | INJECTION | INTRAMUSCULAR | Status: AC
  Administered 2017-02-28: 0.5 mL via INTRAMUSCULAR
  Filled 2017-02-27: qty 0.5

## 2017-02-27 NOTE — Progress Notes (Signed)
PROGRESS NOTE  Gail Olson NFA:[id number] DOB: 07/24/1950 DOA: 02/26/2017 PCP: System, Pcp Not In   LOS: 1 day   Brief Narrative / Interim history: 66 y.o. female with history of bronchitis, palpitations on metoprolol, hypothyroidism has been experiencing exertional shortness of breath for last 5-6 weeks.  Patient also has been having chronic lower extremity edema.  Last 1 week patient noticed increased not like feeling in the left posterior knee.  She presented to the emergency room shortness of breath, and was diagnosed with PE and she was admitted to the hospital on a heparin infusion.  Assessment & Plan: Principal Problem:   Pulmonary embolism (HCC) Active Problems:   Hypothyroidism   Acute bilateral pulmonary embolism with strain pattern -Hemodynamically she is stable, will obtain lower extremity DVT ultrasound as well as 2D echo -If she remains stable, will potentially convert to p.o. anticoagulation this evening and discharge home tomorrow  History of asthmatic bronchitis -Continue albuterol, Flovent, currently not wheezing  Hypothyroidism -Continue Synthroid  History of palpitations -Followed by cardiology as an outpatient, on metoprolol, continue   DVT prophylaxis: Heparin infusion Code Status: Full code Family Communication: Discussed with patient's husband at bedside Disposition Plan: Home when ready, likely 1 day  Consultants:   None   Procedures:   2D echo: pending  Antimicrobials:  None    Subjective: -Feeling a little bit better, denies any chest pain, denies any shortness of breath at rest.  No abdominal pain, nausea vomiting  Objective: Vitals:   02/27/17 0025 02/27/17 0350 02/27/17 0757 02/27/17 1150  BP: 136/70 130/76 135/73 108/73  Pulse: 96 85 83 80  Resp: 18 15 16 14   Temp:  97.9 F (36.6 C) 98.3 F (36.8 C) 98.2 F (36.8 C)  TempSrc:  Oral Oral Oral  SpO2: 94% 97% 96% 97%  Weight:      Height:        Intake/Output Summary  (Last 24 hours) at 02/27/2017 1210 Last data filed at 02/27/2017 0500 Gross per 24 hour  Intake 123.2 ml  Output -  Net 123.2 ml   Filed Weights   02/26/17 1643 02/26/17 2209  Weight: 108.9 kg (240 lb) 108 kg (238 lb 1.6 oz)    Examination:  Constitutional: NAD Eyes: lids and conjunctivae normal ENMT: Mucous membranes are moist. l Respiratory: clear to auscultation bilaterally, no wheezing, no crackles.l Cardiovascular: Regular rate and rhythm, no murmurs / rubs / gallops. No LE edema.  Abdomen: no tenderness. Bowel sounds positive.  Neurologic: CN 2-12 grossly intact. Strength 5/5 in all 4.  Psychiatric: Normal judgment and insight. Alert and oriented x 3. Normal mood.    Data Reviewed: I have independently reviewed following labs and imaging studies   CBC: Recent Labs  Lab 02/26/17 1646 02/27/17 0227  WBC 8.6 7.2  NEUTROABS 5.1  --   HGB 13.9 12.4  HCT 41.4 38.2  MCV 88.7 89.7  PLT 228 204   Basic Metabolic Panel: Recent Labs  Lab 02/26/17 1646 02/27/17 0227  NA 139 138  K 4.1 3.5  CL 105 107  CO2 24 22  GLUCOSE 95 100*  BUN 8 8  CREATININE 0.90 0.77  CALCIUM 9.2 8.7*   GFR: Estimated Creatinine Clearance: 83.7 mL/min (by C-G formula based on SCr of 0.77 mg/dL). Liver Function Tests: No results for input(s): AST, ALT, ALKPHOS, BILITOT, PROT, ALBUMIN in the last 168 hours. No results for input(s): LIPASE, AMYLASE in the last 168 hours. No results for input(s): AMMONIA in the  last 168 hours. Coagulation Profile: No results for input(s): INR, PROTIME in the last 168 hours. Cardiac Enzymes: Recent Labs  Lab 02/26/17 2337 02/27/17 0227 02/27/17 0949  TROPONINI <0.03 <0.03 <0.03   BNP (last 3 results) No results for input(s): PROBNP in the last 8760 hours. HbA1C: No results for input(s): HGBA1C in the last 72 hours. CBG: No results for input(s): GLUCAP in the last 168 hours. Lipid Profile: No results for input(s): CHOL, HDL, LDLCALC, TRIG,  CHOLHDL, LDLDIRECT in the last 72 hours. Thyroid Function Tests: No results for input(s): TSH, T4TOTAL, FREET4, T3FREE, THYROIDAB in the last 72 hours. Anemia Panel: No results for input(s): VITAMINB12, FOLATE, FERRITIN, TIBC, IRON, RETICCTPCT in the last 72 hours. Urine analysis: No results found for: COLORURINE, APPEARANCEUR, LABSPEC, PHURINE, GLUCOSEU, HGBUR, BILIRUBINUR, KETONESUR, PROTEINUR, UROBILINOGEN, NITRITE, LEUKOCYTESUR Sepsis Labs: Invalid input(s): PROCALCITONIN, LACTICIDVEN  Recent Results (from the past 240 hour(s))  MRSA PCR Screening     Status: None   Collection Time: 02/27/17 12:28 AM  Result Value Ref Range Status   MRSA by PCR NEGATIVE NEGATIVE Final    Comment:        The GeneXpert MRSA Assay (FDA approved for NASAL specimens only), is one component of a comprehensive MRSA colonization surveillance program. It is not intended to diagnose MRSA infection nor to guide or monitor treatment for MRSA infections.       Radiology Studies: Ct Angio Chest Pe W And/or Wo Contrast  Result Date: 02/26/2017 CLINICAL DATA:  Shortness of Breath EXAM: CT ANGIOGRAPHY CHEST WITH CONTRAST TECHNIQUE: Multidetector CT imaging of the chest was performed using the standard protocol during bolus administration of intravenous contrast. Multiplanar CT image reconstructions and MIPs were obtained to evaluate the vascular anatomy. CONTRAST:  56 mL ISOVUE-370 IOPAMIDOL (ISOVUE-370) INJECTION 76% COMPARISON:  Chest radiograph November 06, 2015 and chest CT November 29, 2015 FINDINGS: Cardiovascular: There is pulmonary embolus arising from each lateral main pulmonary artery with extension into multiple upper and lower lobe pulmonary arterial branches. The right ventricle to left ventricle diameter ratio is 1.2, a finding indicative of a degree of right heart strain. There is no appreciable thoracic aortic aneurysm or dissection. Visualized great vessels appear normal. There are foci of  aortic atherosclerosis. There is no appreciable pericardial effusion or pericardial thickening. Mediastinum/Nodes: Thyroid appears normal. There are subcentimeter mediastinal lymph nodes. There is no adenopathy by size criteria. There is a small hiatal hernia. Lungs/Pleura: No edema or consolidation. There is a persistent somewhat mosaic pattern of lung attenuation consistent with small airways obstructive disease as well as patchy atelectasis. No pleural effusion or pleural thickening evident. Upper Abdomen: Gallbladder is absent. Visualized upper abdominal structures otherwise appear unremarkable. Musculoskeletal: Patient has had kyphoplasty procedures in the upper lumbar region. There is degenerative change with vacuum phenomenon at several levels in the lower thoracic and upper lumbar regions. There are no blastic or lytic bone lesions. Review of the MIP images confirms the above findings. IMPRESSION: 1. Pulmonary emboli bilaterally arising from each distal main pulmonary artery extending into multiple upper lobe and lower lobe pulmonary arterial branches bilaterally. Positive for acute PE with CT evidence of right heart strain (RV/LV Ratio = 1.2) consistent with at least submassive (intermediate risk) PE. The presence of right heart strain has been associated with an increased risk of morbidity and mortality. Please activate Code PE by paging [phone]. 2. Somewhat mosaic pattern of attenuation in the lung parenchyma. Suspect a combination of atelectasis and small airways obstructive disease. No  edema or consolidation. 3.  No evident adenopathy. 4.  Mild aortic atherosclerosis. 5.  Gallbladder absent. Aortic Atherosclerosis (ICD10-I70.0). Critical Value/emergent results were called by telephone at the time of interpretation on 02/26/2017 at 7:16 pm to Dr. Alona Bene , who verbally acknowledged these results. Electronically Signed   By: Bretta Bang III M.D.   On: 02/26/2017 19:17     Scheduled  Meds: . Influenza vac split quadrivalent PF  0.5 mL Intramuscular Tomorrow-1000  . levothyroxine  50 mcg Oral Once per day on Mon Wed Fri  . [START ON 02/28/2017] levothyroxine  75 mcg Oral Once per day on Sun Tue Thu Sat  . metoprolol succinate  25 mg Oral QHS  . pantoprazole  40 mg Oral Daily  . [START ON 02/28/2017] pneumococcal 23 valent vaccine  0.5 mL Intramuscular Tomorrow-1000   Continuous Infusions: . heparin 1,400 Units/hr (02/26/17 2012)    Pamella Pert, MD, PhD [facility] Pager [phone]  If 7PM-7AM, please contact night-coverage [email or web address] Password [email or web address] 02/27/2017, 12:10 PM

## 2017-02-27 NOTE — Progress Notes (Signed)
  Echocardiogram 2D Echocardiogram has been performed.  Gail Olson 02/27/2017, 3:09 PM

## 2017-02-27 NOTE — Progress Notes (Signed)
Left lower extremity venous duplex completed. Positive for an acute DVT noted in the proximal peroneal vein which does not extend proximally. There is no propagation to the right common femoral vein. Gray[place] ElectricVirginia Brahim Dolman, RVS 02/27/2017, 9:41 AM

## 2017-02-27 NOTE — Progress Notes (Addendum)
ANTICOAGULATION CONSULT NOTE - F/u Consult  Pharmacy Consult for heparin Indication: pulmonary embolus  Allergies  Allergen Reactions  . Other Other (See Comments)    Extreme stomach pain if any of these are eaten: spicy foods, onions, fried foods, cauliflower, and broccoli  . Prednisone Other (See Comments)    Not suppose to take due to advanced osteoporosis  . Risedronate Other (See Comments)    (Actonel) Caused EXTREME JAW PAIN and muscles and joints ached   . Codeine Palpitations and Other (See Comments)    Tachycardia   . Penicillins Rash    Has patient had a PCN reaction causing immediate rash, facial/tongue/throat swelling, SOB or lightheadedness with hypotension: Yes Has patient had a PCN reaction causing severe rash involving mucus membranes or skin necrosis: Unk Has patient had a PCN reaction that required hospitalization: Unk Has patient had a PCN reaction occurring within the last 10 years: Unk If all of the above answers are "NO", then may proceed with Cephalosporin use.    Patient Measurements: Height: 5\' 6"  (167.6 cm) Weight: 238 lb 1.6 oz (108 kg) IBW/kg (Calculated) : 59.3 Heparin Dosing Weight: 84.5 kg  Vital Signs: Temp: 97.9 F (36.6 C) (12/28 0350) Temp Source: Oral (12/28 0350) BP: 130/76 (12/28 0350) Pulse Rate: 85 (12/28 0350)  Labs: Recent Labs    02/26/17 1646 02/26/17 2337 02/27/17 0227  HGB 13.9  --  12.4  HCT 41.4  --  38.2  PLT 228  --  204  HEPARINUNFRC  --   --  0.56  CREATININE 0.90  --   --   TROPONINI  --  <0.03  --     Estimated Creatinine Clearance: 74.4 mL/min (by C-G formula based on SCr of 0.9 mg/dL).   Medical History: Past Medical History:  Diagnosis Date  . Arthritis   . Thyroid disease     Medications:  Scheduled:  . Influenza vac split quadrivalent PF  0.5 mL Intramuscular Tomorrow-1000  . levothyroxine  50 mcg Oral Once per day on Mon Wed Fri  . [START ON 02/28/2017] levothyroxine  75 mcg Oral Once per day  on Sun Tue Thu Sat  . metoprolol succinate  25 mg Oral QHS  . pantoprazole  40 mg Oral Daily   Assessment: [age] yof admitted with positive D-dimer and shortness of breath. CTA showing bilateral pulmonary emboli with right heart strain (RV/LV ratio=1.2). Pharmacy to dose heparin.   Initial heparin level is therapeutic at 0.56. CBC slightly downtrending, but within normal limits. No s/s bleeding reported.   Goal of Therapy:  Heparin level 0.3-0.7 units/ml Monitor platelets by anticoagulation protocol: Yes   Plan:  Continue heparin gtt at 1400 units/hr  Heparin level in 6 hrs to confirm Heparin level and CBC daily Monitor for s/s bleeding F/u long-term AC plan   Einar CrowKatherine Wong [place], PharmD Clinical Pharmacist 02/27/17 4:39 AM

## 2017-02-27 NOTE — Progress Notes (Signed)
ANTICOAGULATION CONSULT NOTE - F/u Consult  Pharmacy Consult for heparin Indication: pulmonary embolus  Allergies  Allergen Reactions  . Other Other (See Comments)    Extreme stomach pain if any of these are eaten: spicy foods, onions, fried foods, cauliflower, and broccoli  . Prednisone Other (See Comments)    Not suppose to take due to advanced osteoporosis  . Risedronate Other (See Comments)    (Actonel) Caused EXTREME JAW PAIN and muscles and joints ached   . Codeine Palpitations and Other (See Comments)    Tachycardia   . Penicillins Rash    Has patient had a PCN reaction causing immediate rash, facial/tongue/throat swelling, SOB or lightheadedness with hypotension: Yes Has patient had a PCN reaction causing severe rash involving mucus membranes or skin necrosis: Unk Has patient had a PCN reaction that required hospitalization: Unk Has patient had a PCN reaction occurring within the last 10 years: Unk If all of the above answers are "NO", then may proceed with Cephalosporin use.    Patient Measurements: Height: 5\' 6"  (167.6 cm) Weight: 238 lb 1.6 oz (108 kg) IBW/kg (Calculated) : 59.3 Heparin Dosing Weight: 84.5 kg  Vital Signs: Temp: 98.2 F (36.8 C) (12/28 1150) Temp Source: Oral (12/28 1150) BP: 108/73 (12/28 1150) Pulse Rate: 80 (12/28 1150)  Labs: Recent Labs    02/26/17 1646 02/26/17 2337 02/27/17 0227 02/27/17 0949  HGB 13.9  --  12.4  --   HCT 41.4  --  38.2  --   PLT 228  --  204  --   HEPARINUNFRC  --   --  0.56 0.64  CREATININE 0.90  --  0.77  --   TROPONINI  --  <0.03 <0.03 <0.03    Estimated Creatinine Clearance: 83.7 mL/min (by C-G formula based on SCr of 0.77 mg/dL).   Medical History: Past Medical History:  Diagnosis Date  . Arthritis   . Thyroid disease     Medications:  Scheduled:  . Influenza vac split quadrivalent PF  0.5 mL Intramuscular Tomorrow-1000  . levothyroxine  50 mcg Oral Once per day on Mon Wed Fri  . [START ON  02/28/2017] levothyroxine  75 mcg Oral Once per day on Sun Tue Thu Sat  . metoprolol succinate  25 mg Oral QHS  . pantoprazole  40 mg Oral Daily  . [START ON 02/28/2017] pneumococcal 23 valent vaccine  0.5 mL Intramuscular Tomorrow-1000   Assessment: 66 yof admitted with positive D-dimer and shortness of breath. CTA showing bilateral pulmonary emboli with right heart strain (RV/LV ratio=1.2). Pharmacy to dose heparin.   Heparin level is therapeutic at 0.63 on heparin drip rate 1400 uts/hr. CBC slightly downtrending, but within normal limits. No s/s bleeding reported.   Goal of Therapy:  Heparin level 0.3-0.7 units/ml Monitor platelets by anticoagulation protocol: Yes   Plan:  Continue heparin gtt at 1400 units/hr  Heparin level and CBC daily Monitor for s/s bleeding F[facility]   Leota SauersLisa Newt Levingston Pharm.D. CPP, BCPS Clinical Pharmacist [phone] 02/27/2017 2:31 PM

## 2017-02-28 LAB — BASIC METABOLIC PANEL
Anion gap: 11 (ref 5–15)
BUN: 7 mg/dL (ref 6–20)
CO2: 22 mmol/L (ref 22–32)
CREATININE: 0.73 mg/dL (ref 0.44–1.00)
Calcium: 8.6 mg/dL — ABNORMAL LOW (ref 8.9–10.3)
Chloride: 106 mmol/L (ref 101–111)
GFR calc Af Amer: >60 mL/min (ref >60)
GLUCOSE: 106 mg/dL — AB (ref 65–99)
POTASSIUM: 3.8 mmol/L (ref 3.5–5.1)
SODIUM: 139 mmol/L (ref 135–145)

## 2017-02-28 LAB — CBC
HCT: 39.4 % (ref 36.0–46.0)
Hemoglobin: 12.8 g/dL (ref 12.0–15.0)
MCH: 29 pg (ref 26.0–34.0)
MCHC: 32.5 g/dL (ref 30.0–36.0)
MCV: 89.1 fL (ref 78.0–100.0)
Platelets: 220 K/uL (ref 150–400)
RBC: 4.42 MIL/uL (ref 3.87–5.11)
RDW: 14.3 % (ref 11.5–15.5)
WBC: 7.1 K/uL (ref 4.0–10.5)

## 2017-02-28 LAB — HEPARIN LEVEL (UNFRACTIONATED): Heparin Unfractionated: 0.65 [IU]/mL (ref 0.30–0.70)

## 2017-02-28 MED ORDER — APIXABAN 5 MG PO TABS: 5.0000 mg | ORAL_TABLET | Freq: Two times a day (BID) | ORAL | Status: DC

## 2017-02-28 MED ORDER — APIXABAN 5 MG PO TABS
10.0000 mg | ORAL_TABLET | Freq: Two times a day (BID) | ORAL | Status: DC
  Administered 2017-02-28: 10 mg via ORAL
  Filled 2017-02-28: qty 2

## 2017-02-28 MED ORDER — UNABLE TO FIND: 1 refills | Status: DC

## 2017-02-28 MED ORDER — APIXABAN 5 MG PO TABS: 10.0000 mg | ORAL_TABLET | Freq: Two times a day (BID) | ORAL | 1 refills | Status: DC

## 2017-02-28 NOTE — Care Management Note (Signed)
Case Management Note  Patient Details  Name: Gail Olson MRN: [id number] Date of Birth: 10/17/1950  Subjective/Objective:                 DC to home w assist from spouse. No HH PT needed per eval. Received Eliquis card. No other CM needs   Action/Plan:   Expected Discharge Date:  02/28/17               Expected Discharge Plan:  Home/Self Care  In-House Referral:     Discharge planning Services  CM Consult, Medication Assistance  Post Acute Care Choice:    Choice offered to:     DME Arranged:    DME Agency:     HH Arranged:    HH Agency:     Status of Service:  Completed, signed off  If discussed at [employer] Length of Stay Meetings, dates discussed:    Additional Comments:  Lawerance SabalDebbie Jessicalynn Deshong, RN 02/28/2017, 3:23 PM

## 2017-02-28 NOTE — Evaluation (Signed)
Physical Therapy Evaluation Patient Details Name: Gail Olson Rumbold MRN: [id number] DOB: [birth year] Today's Date: 02/28/2017   History of Present Illness  Gail Olson Bee is a 66 y.o. female with history of bronchitis, palpitations on metoprolol, hypothyroidism has been experiencing exertional shortness of breath for last 5-6 weeks.  Patient admitted for DVT/PE.  Clinical Impression  Patient presents with decreased mobility due to bedrest and SOB with new PE.  Discussed plans for continued mobilization at home and no current follow up PT recommended.  Patient aware can access outpatient PT via PCP if needed in the future.  Safe for d/c without further DME or HH needs.     Follow Up Recommendations No PT follow up    Equipment Recommendations  None recommended by PT    Recommendations for Other Services       Precautions / Restrictions Precautions Precautions: Fall Precaution Comments: reports multiple back fractures due to osteoporosis      Mobility  Bed Mobility Overal bed mobility: Modified Independent             General bed mobility comments: with elevated HOB, has adjustable bed at home  Transfers Overall transfer level: Needs assistance Equipment used: Rolling walker (2 wheeled) Transfers: Sit to/from Stand Sit to Stand: Min guard         General transfer comment: for safety  Ambulation/Gait Ambulation/Gait assistance: Supervision;Min guard Ambulation Distance (Feet): 90 Feet Assistive device: Rolling walker (2 wheeled) Gait Pattern/deviations: Decreased stride length;Trunk flexed     General Gait Details: minaguard for safety initially, improved with time/practice, pt SOB, but SpO2 95% throughout.    Stairs            Wheelchair Mobility    Modified Rankin (Stroke Patients Only)       Balance Overall balance assessment: Needs assistance   Sitting balance-Leahy Scale: Good       Standing balance-Leahy Scale: Fair Standing balance  comment: static standing without UE support, but walker for ambulation; educated pt and spouse pt to use walker first week or so at home.                             Pertinent Vitals/Pain Pain Assessment: 0-10 Pain Score: 2  Pain Location: headache and occasionally in chest Pain Descriptors / Indicators: Headache Pain Intervention(s): Monitored during session    Home Living Family/patient expects to be discharged to:: Private residence Living Arrangements: Spouse/significant other Available Help at Discharge: Family;Available PRN/intermittently Type of Home: House Home Access: Ramped entrance     Home Layout: One level Home Equipment: Walker - 2 wheels;Cane - single point;Wheelchair - manual;Shower seat;Hand held shower head;Grab bars - toilet Additional Comments: spouse works 11-9:[employer] truck [employer] doctordriver, but calls her every hour; states he calls another family member to come if he leaves town    Prior Function Level of Independence: Independent with assistive device(s)         Comments: use cane most of the time, sometimes uses walker, but when sick uses w/c     Hand Dominance        Extremity/Trunk Assessment   Upper Extremity Assessment Upper Extremity Assessment: Generalized weakness    Lower Extremity Assessment Lower Extremity Assessment: Generalized weakness    Cervical / Trunk Assessment Cervical / Trunk Assessment: Kyphotic  Communication   Communication: No difficulties  Cognition Arousal/Alertness: Awake/alert Behavior During Therapy: WFL for tasks assessed/performed Overall Cognitive Status: Within Functional Limits for tasks  assessed                                        General Comments General comments (skin integrity, edema, etc.): spouse in the room, reports he will be staying at home first several days she is home.  Extensive discussion about fall risk reduction tips for home especially being on blood thinner and need for  using walker initially or even w/c if not safe and in the home alone.  Patient and spouse agree to no follow up HHPT due to pt with her own routine and has had issues in the past when overdoing it with PT.     Exercises     Assessment/Plan    PT Assessment Patent does not need any further PT services  PT Problem List         PT Treatment Interventions      PT Goals (Current goals can be found in the Care Plan section)  Acute Rehab PT Goals PT Goal Formulation: All assessment and education complete, DC therapy    Frequency     Barriers to discharge        Co-evaluation               AM-PAC PT "6 Clicks" Daily Activity  Outcome Measure Difficulty turning over in bed (including adjusting bedclothes, sheets and blankets)?: A Little Difficulty moving from lying on back to sitting on the side of the bed? : A Little Difficulty sitting down on and standing up from a chair with arms (e.g., wheelchair, bedside commode, etc,.)?: Unable Help needed moving to and from a bed to chair (including a wheelchair)?: A Little Help needed walking in hospital room?: A Little Help needed climbing 3-5 steps with a railing? : A Little 6 Click Score: 16    End of Session Equipment Utilized During Treatment: Gait belt Activity Tolerance: Patient tolerated treatment well Patient left: in bed;with call bell/phone within reach;with family/visitor present;Other (comment)(MD in room)   PT Visit Diagnosis: Other abnormalities of gait and mobility (R26.89)    Time: 1417-1500 PT Time Calculation (min) (ACUTE ONLY): 43 min   Charges:   PT Evaluation $PT Eval Moderate Complexity: 1 Mod PT Treatments $Gait Training: 8-22 mins $Self Care/Home Management: 8-22   PT G CodesSheran Lawless:        Cyndi Wynn, [place] CarolinaPT [phone] 02/28/2017   Elray Mcgregorynthia Wynn 02/28/2017, 3:23 PM

## 2017-02-28 NOTE — Discharge Summary (Signed)
Physician Discharge Summary  Gail Olson ZOX:[id number] DOB: [birth year] DOA: 02/26/2017  PCP: System, Pcp Not In  Admit date: 02/26/2017 Discharge date: 02/28/2017  Admitted From: home Disposition:  home  Recommendations for Outpatient Follow-up:  1. Follow up with PCP in 1-2 weeks 2. Patient started on Eliquis for PE/DVT  Home Health: none Equipment/Devices: none  Discharge Condition: stable CODE STATUS: Full code Diet recommendation: regular  HPI: Per Dr. Gilford SilviusKakrakandi, Gail Olson is a 66 y.o. female with history of bronchitis, palpitations on metoprolol, hypothyroidism has been experiencing exertional shortness of breath for last 5-6 weeks.  Patient also has been having chronic lower extremity edema.  Last 1 week patient noticed increased not like feeling in the left posterior knee.  Denies any recent travel or surgeries.  Patient usually ambulates with the help of a cane. Today patient did develop some retrosternal pleuritic type of chest pain.  Patient went to the urgent care center and was referred to the ER concerning for DVT and PE. ED Course: In the ER patient's d-dimer was elevated at 10 and underwent CT angiogram of the chest which showed bilateral pulmonary embolism with strain pattern.  Patient remained hemodynamically stable and was found in distress.  Patient was started on IV heparin.  And admitted for acute pulmonary embolism.   Hospital Course: Discharge Diagnoses:  Principal Problem:   Pulmonary embolism (HCC) Active Problems:   Hypothyroidism   Acute bilateral pulmonary embolism with strain pattern -patient was admitted to the hospital with acute PE, and given strain pattern on the CT scan she was placed in stepdown.  She was placed on heparin infusion, she has remained stable, no episodes of hypotension hypoxia or tachycardia.  Her hemoglobin has remained stable and there were no evidence of bleed.  She was transitioned to Eliquis and was discharged  home in stable condition.  She underwent a 2D echo which showed an EF of 60-65%, grade 1 diastolic dysfunction (full results below).  She was able to work with physical therapy without chest pain or shortness of breath.  No PT follow-up was recommended.  Will need between 3 and 6 months of anticoagulation. History of asthmatic bronchitis -Continue albuterol, Flovent, currently not wheezing Hypothyroidism -Continue Synthroid History of palpitations -Followed by cardiology as an outpatient, on metoprolol, continue   Discharge Instructions   Allergies as of 02/28/2017      Reactions   Other Other (See Comments)   Extreme stomach pain if any of these are eaten: spicy foods, onions, fried foods, cauliflower, and broccoli   Prednisone Other (See Comments)   Not suppose to take due to advanced osteoporosis   Risedronate Other (See Comments)   (Actonel) Caused EXTREME JAW PAIN and muscles and joints ached   Codeine Palpitations, Other (See Comments)   Tachycardia   Penicillins Rash   Has patient had a PCN reaction causing immediate rash, facial/tongue/throat swelling, SOB or lightheadedness with hypotension: Yes Has patient had a PCN reaction causing severe rash involving mucus membranes or skin necrosis: Unk Has patient had a PCN reaction that required hospitalization: Unk Has patient had a PCN reaction occurring within the last 10 years: Unk If all of the above answers are "NO", then may proceed with Cephalosporin use.      Medication List    STOP taking these medications   aspirin EC 81 MG tablet     TAKE these medications   acetaminophen 650 MG CR tablet Commonly known as:  TYLENOL Take 650 mg  by mouth every 8 (eight) hours as needed for pain.   PROAIR HFA 108 (90 Base) MCG/ACT inhaler Generic drug:  albuterol Inhale 2 puffs into the lungs See admin instructions. 2 puffs every four to six hours as needed for shortness of breath   albuterol (2.5 MG/3ML) 0.083% nebulizer  solution Commonly known as:  PROVENTIL Take 2.5 mg by nebulization every 4 (four) hours as needed for wheezing or shortness of breath (AS DIRECTED BY MD).   apixaban 5 MG Tabs tablet Commonly known as:  ELIQUIS Take 2 tablets (10 mg total) by mouth 2 (two) times daily. Take 2 tablets twice daily for 7 days then 1 tablet twice daily. First dose Sat 03/07/2017   betamethasone dipropionate 0.05 % ointment Commonly known as:  DIPROLENE Apply 1 application topically 2 (two) times daily. aaa   budesonide-formoterol 160-4.5 MCG/ACT inhaler Commonly known as:  SYMBICORT Inhale 2 puffs into the lungs 2 (two) times daily.   CORICIDIN HBP CONGESTION/COUGH 10-200 MG Caps Generic drug:  Dextromethorphan-Guaifenesin Take 1 capsule by mouth every 8 (eight) hours as needed (for cold-like symptoms).   fluticasone 50 MCG/ACT nasal spray Commonly known as:  FLONASE Place 2 sprays into both nostrils at bedtime as needed for allergies or rhinitis.   ibuprofen 200 MG tablet Commonly known as:  ADVIL,MOTRIN Take 400 mg by mouth every 6 (six) hours as needed (for pain).   levothyroxine 75 MCG tablet Commonly known as:  SYNTHROID, LEVOTHROID Take 75 mcg by mouth every Tuesday, Thursday, Saturday, and Sunday.   levothyroxine 50 MCG tablet Commonly known as:  SYNTHROID, LEVOTHROID Take 50 mcg by mouth every Monday, Wednesday, and Friday.   meclizine 25 MG tablet Commonly known as:  ANTIVERT Take 25 mg by mouth 3 (three) times daily as needed for dizziness.   methocarbamol 750 MG tablet Commonly known as:  ROBAXIN Take 750 mg by mouth 3 (three) times daily as needed for muscle spasms.   metoprolol succinate 25 MG 24 hr tablet Commonly known as:  TOPROL-XL Take 25 mg by mouth at bedtime.   omeprazole 20 MG capsule Commonly known as:  PRILOSEC Take 20 mg by mouth daily as needed (for reflux).   OVER THE COUNTER MEDICATION Remefemin tablets: Take 1 tablet by mouth two to three times a week    UNABLE TO FIND Compression stockings, daily      Follow-up Information    PCP. Schedule an appointment as soon as possible for a visit in 2 week(s).           Consultations:  None   Procedures/Studies:  2D echo  Study Conclusions - Left ventricle: The cavity size was normal. There was mild concentric hypertrophy. Systolic function was normal. The estimated ejection fraction was in the range of 60% to 65%. Wall motion was normal; there were no regional wall motion abnormalities. Doppler parameters are consistent with abnormal left ventricular relaxation (grade 1 diastolic dysfunction). There was no evidence of elevated ventricular filling pressure by Doppler parameters. - Aortic valve: There was no regurgitation. - Mitral valve: There was trivial regurgitation. - Right ventricle: The cavity size was mildly dilated. Wall thickness was normal. Systolic function was mildly reduced. - Right atrium: The atrium was normal in size. - Pulmonic valve: There was no regurgitation. - Pulmonary arteries: Systolic pressure was moderately increased. PA peak pressure: 47 mm Hg (S). - Inferior vena cava: The vessel was normal in size. - Pericardium, extracardiac: There was no pericardial effusion.  Ct Angio Chest Pe  W And/or Wo Contrast  Result Date: 02/26/2017 CLINICAL DATA:  Shortness of Breath EXAM: CT ANGIOGRAPHY CHEST WITH CONTRAST TECHNIQUE: Multidetector CT imaging of the chest was performed using the standard protocol during bolus administration of intravenous contrast. Multiplanar CT image reconstructions and MIPs were obtained to evaluate the vascular anatomy. CONTRAST:  56 mL ISOVUE-370 IOPAMIDOL (ISOVUE-370) INJECTION 76% COMPARISON:  Chest radiograph November 06, 2015 and chest CT November 29, 2015 FINDINGS: Cardiovascular: There is pulmonary embolus arising from each lateral main pulmonary artery with extension into multiple upper and lower lobe pulmonary arterial branches. The right  ventricle to left ventricle diameter ratio is 1.2, a finding indicative of a degree of right heart strain. There is no appreciable thoracic aortic aneurysm or dissection. Visualized great vessels appear normal. There are foci of aortic atherosclerosis. There is no appreciable pericardial effusion or pericardial thickening. Mediastinum/Nodes: Thyroid appears normal. There are subcentimeter mediastinal lymph nodes. There is no adenopathy by size criteria. There is a small hiatal hernia. Lungs/Pleura: No edema or consolidation. There is a persistent somewhat mosaic pattern of lung attenuation consistent with small airways obstructive disease as well as patchy atelectasis. No pleural effusion or pleural thickening evident. Upper Abdomen: Gallbladder is absent. Visualized upper abdominal structures otherwise appear unremarkable. Musculoskeletal: Patient has had kyphoplasty procedures in the upper lumbar region. There is degenerative change with vacuum phenomenon at several levels in the lower thoracic and upper lumbar regions. There are no blastic or lytic bone lesions. Review of the MIP images confirms the above findings. IMPRESSION: 1. Pulmonary emboli bilaterally arising from each distal main pulmonary artery extending into multiple upper lobe and lower lobe pulmonary arterial branches bilaterally. Positive for acute PE with CT evidence of right heart strain (RV/LV Ratio = 1.2) consistent with at least submassive (intermediate risk) PE. The presence of right heart strain has been associated with an increased risk of morbidity and mortality. Please activate Code PE by paging [phone]. 2. Somewhat mosaic pattern of attenuation in the lung parenchyma. Suspect a combination of atelectasis and small airways obstructive disease. No edema or consolidation. 3.  No evident adenopathy. 4.  Mild aortic atherosclerosis. 5.  Gallbladder absent. Aortic Atherosclerosis (ICD10-I70.0). Critical Value/emergent results were called  by telephone at the time of interpretation on 02/26/2017 at 7:16 pm to Dr. Alona Bene , who verbally acknowledged these results. Electronically Signed   By: Bretta Bang III M.D.   On: 02/26/2017 19:17      Subjective: - no chest pain, shortness of breath, no abdominal pain, nausea or vomiting.   Discharge Exam: Vitals:   02/28/17 1136 02/28/17 1524  BP: 103/70 127/68  Pulse: 90 91  Resp: 18 18  Temp: 98.4 F (36.9 C) 98.6 F (37 C)  SpO2: 95%     General: Pt is alert, awake, not in acute distress Cardiovascular: RRR, S1/S2 +, no rubs, no gallops Respiratory: CTA bilaterally, no wheezing, no rhonchi Abdominal: Soft, NT, ND, bowel sounds + Extremities: no edema, no cyanosis    The results of significant diagnostics from this hospitalization (including imaging, microbiology, ancillary and laboratory) are listed below for reference.     Microbiology: Recent Results (from the past 240 hour(s))  MRSA PCR Screening     Status: None   Collection Time: 02/27/17 12:28 AM  Result Value Ref Range Status   MRSA by PCR NEGATIVE NEGATIVE Final    Comment:        The GeneXpert MRSA Assay (FDA approved for NASAL specimens only), is one  component of a comprehensive MRSA colonization surveillance program. It is not intended to diagnose MRSA infection nor to guide or monitor treatment for MRSA infections.      Labs: BNP (last 3 results) Recent Labs    02/26/17 2337  BNP 13.9   Basic Metabolic Panel: Recent Labs  Lab 02/26/17 1646 02/27/17 0227 02/28/17 0225  NA 139 138 139  K 4.1 3.5 3.8  CL 105 107 106  CO2 24 22 22   GLUCOSE 95 100* 106*  BUN 8 8 7   CREATININE 0.90 0.77 0.73  CALCIUM 9.2 8.7* 8.6*   Liver Function Tests: No results for input(s): AST, ALT, ALKPHOS, BILITOT, PROT, ALBUMIN in the last 168 hours. No results for input(s): LIPASE, AMYLASE in the last 168 hours. No results for input(s): AMMONIA in the last 168 hours. CBC: Recent Labs  Lab  02/26/17 1646 02/27/17 0227 02/28/17 0225  WBC 8.6 7.2 7.1  NEUTROABS 5.1  --   --   HGB 13.9 12.4 12.8  HCT 41.4 38.2 39.4  MCV 88.7 89.7 89.1  PLT 228 204 220   Cardiac Enzymes: Recent Labs  Lab 02/26/17 2337 02/27/17 0227 02/27/17 0949  TROPONINI <0.03 <0.03 <0.03   BNP: Invalid input(s): POCBNP CBG: No results for input(s): GLUCAP in the last 168 hours. D-Dimer Recent Labs    02/26/17 1646  DDIMER 10.85*   Hgb A1c No results for input(s): HGBA1C in the last 72 hours. Lipid Profile No results for input(s): CHOL, HDL, LDLCALC, TRIG, CHOLHDL, LDLDIRECT in the last 72 hours. Thyroid function studies No results for input(s): TSH, T4TOTAL, T3FREE, THYROIDAB in the last 72 hours.  Invalid input(s): FREET3 Anemia work up No results for input(s): VITAMINB12, FOLATE, FERRITIN, TIBC, IRON, RETICCTPCT in the last 72 hours. Urinalysis No results found for: COLORURINE, APPEARANCEUR, LABSPEC, PHURINE, GLUCOSEU, HGBUR, BILIRUBINUR, KETONESUR, PROTEINUR, UROBILINOGEN, NITRITE, LEUKOCYTESUR Sepsis Labs Invalid input(s): PROCALCITONIN,  WBC,  LACTICIDVEN   Time coordinating discharge: 40 minutes  SIGNED:  Pamella Pertostin Lynette Noah, MD  [facility] 02/28/2017, 3:40 PM Pager [phone]  If 7PM-7AM, please contact night-coverage [email or web address] Password [email or web address]

## 2017-02-28 NOTE — Progress Notes (Signed)
Patient and husband supplied with 30 day free card for Eliquis. Benefit check not available due to insurance offices being closed on weekends.

## 2017-02-28 NOTE — Progress Notes (Signed)
ANTICOAGULATION CONSULT NOTE - Follow-Up Consult  Pharmacy Consult for heparin to apixaban Indication: pulmonary embolus  Allergies  Allergen Reactions  . Other Other (See Comments)    Extreme stomach pain if any of these are eaten: spicy foods, onions, fried foods, cauliflower, and broccoli  . Prednisone Other (See Comments)    Not suppose to take due to advanced osteoporosis  . Risedronate Other (See Comments)    (Actonel) Caused EXTREME JAW PAIN and muscles and joints ached   . Codeine Palpitations and Other (See Comments)    Tachycardia   . Penicillins Rash    Has patient had a PCN reaction causing immediate rash, facial/tongue/throat swelling, SOB or lightheadedness with hypotension: Yes Has patient had a PCN reaction causing severe rash involving mucus membranes or skin necrosis: Unk Has patient had a PCN reaction that required hospitalization: Unk Has patient had a PCN reaction occurring within the last 10 years: Unk If all of the above answers are "NO", then may proceed with Cephalosporin use.     Patient Measurements: Height: 5\' 6"  (167.6 cm) Weight: 238 lb 1.6 oz (108 kg) IBW/kg (Calculated) : 59.3  Vital Signs: Temp: 98 F (36.7 C) (12/29 0802) Temp Source: Oral (12/29 0802) BP: 105/62 (12/29 0802) Pulse Rate: 84 (12/29 0802)  Labs: Recent Labs    02/26/17 1646 02/26/17 2337 02/27/17 0227 02/27/17 0949 02/28/17 0225  HGB 13.9  --  12.4  --  12.8  HCT 41.4  --  38.2  --  39.4  PLT 228  --  204  --  220  HEPARINUNFRC  --   --  0.56 0.64 0.65  CREATININE 0.90  --  0.77  --  0.73  TROPONINI  --  <0.03 <0.03 <0.03  --     Estimated Creatinine Clearance: 83.7 mL/min (by C-G formula based on SCr of 0.73 mg/dL).   Medical History: Past Medical History:  Diagnosis Date  . Arthritis   . Thyroid disease    Assessment: [age] yoF admitted with SOB found to have bilateral PE on CT started on IV heparin. Anti-Xa level therapeutic this morning, now to  transition to apixaban per pharmacy.  Goal of Therapy:  Monitor platelets by anticoagulation protocol: Yes   Plan:  -Stop heparin -Apixaban 10mg  PO BID x7 days then apixaban 5mg  BID -Pharmacy will sign off, please reconsult as needed  Fredonia HighlandMichael Keston Seever, PharmD, BCPS PGY-2 Cardiology Pharmacy Resident Pager: [phone] 02/28/2017

## 2017-02-28 NOTE — Discharge Instructions (Addendum)
Information on my medicine - ELIQUIS (apixaban)  This medication education was reviewed with me or my healthcare representative as part of my discharge preparation.  The pharmacist that spoke with me during my hospital stay was:  Mosetta AnisMichael T Bitonti, [facility]  Why was Eliquis prescribed for you? Eliquis was prescribed to treat blood clots that may have been found in the veins of your legs (deep vein thrombosis) or in your lungs (pulmonary embolism) and to reduce the risk of them occurring again.  What do You need to know about Eliquis ? The starting dose is 10 mg (two 5 mg tablets) taken TWICE daily for the FIRST SEVEN (7) DAYS, then on (enter date)  03/07/2017  the dose is reduced to ONE 5 mg tablet taken TWICE daily.  Eliquis may be taken with or without food.   Try to take the dose about the same time in the morning and in the evening. If you have difficulty swallowing the tablet whole please discuss with your pharmacist how to take the medication safely.  Take Eliquis exactly as prescribed and DO NOT stop taking Eliquis without talking to the doctor who prescribed the medication.  Stopping may increase your risk of developing a new blood clot.  Refill your prescription before you run out.  After discharge, you should have regular check-up appointments with your healthcare provider that is prescribing your Eliquis.    What do you do if you miss a dose? If a dose of ELIQUIS is not taken at the scheduled time, take it as soon as possible on the same day and twice-daily administration should be resumed. The dose should not be doubled to make up for a missed dose.  Important Safety Information A possible side effect of Eliquis is bleeding. You should call your healthcare provider right away if you experience any of the following: ? Bleeding from an injury or your nose that does not stop. ? Unusual colored urine (red or dark brown) or unusual colored stools (red or black). ? Unusual bruising  for unknown reasons. ? A serious fall or if you hit your head (even if there is no bleeding).  Some medicines may interact with Eliquis and might increase your risk of bleeding or clotting while on Eliquis. To help avoid this, consult your healthcare provider or pharmacist prior to using any new prescription or non-prescription medications, including herbals, vitamins, non-steroidal anti-inflammatory drugs (NSAIDs) and supplements.  This website has more information on Eliquis (apixaban): http://[email or web address].com/eliquis/home  Follow with primary MD in 1-2 weeks  Please get a complete blood count and chemistry panel checked by your Primary MD at your next visit, and again as instructed by your Primary MD. Please get your medications reviewed and adjusted by your Primary MD.  Please request your Primary MD to go over all Hospital Tests and Procedure/Radiological results at the follow up, please get all Hospital records sent to your Prim MD by signing hospital release before you go home.  If you had Pneumonia of Lung problems at the Hospital: Please get a 2 view Chest X ray done in 6-8 weeks after hospital discharge or sooner if instructed by your Primary MD.  If you have Congestive Heart Failure: Please call your Cardiologist or Primary MD anytime you have any of the following symptoms:  1) 3 pound weight gain in 24 hours or 5 pounds in 1 week  2) shortness of breath, with or without a dry hacking cough  3) swelling in the hands, feet or stomach  4) if you have to sleep on extra pillows at night in order to breathe  Follow cardiac low salt diet and 1.5 lit/day fluid restriction.  If you have diabetes Accuchecks 4 times/day, Once in AM empty stomach and then before each meal. Log in all results and show them to your primary doctor at your next visit. If any glucose reading is under 80 or above 300 call your primary MD immediately.  If you have Seizure/Convulsions/Epilepsy: Please do not  drive, operate heavy machinery, participate in activities at heights or participate in high speed sports until you have seen by Primary MD or a Neurologist and advised to do so again.  If you had Gastrointestinal Bleeding: Please ask your Primary MD to check a complete blood count within one week of discharge or at your next visit. Your endoscopic/colonoscopic biopsies that are pending at the time of discharge, will also need to followed by your Primary MD.  Get Medicines reviewed and adjusted. Please take all your medications with you for your next visit with your Primary MD  Please request your Primary MD to go over all hospital tests and procedure/radiological results at the follow up, please ask your Primary MD to get all Hospital records sent to his/her office.  If you experience worsening of your admission symptoms, develop shortness of breath, life threatening emergency, suicidal or homicidal thoughts you must seek medical attention immediately by calling 911 or calling your MD immediately  if symptoms less severe.  You must read complete instructions/literature along with all the possible adverse reactions/side effects for all the Medicines you take and that have been prescribed to you. Take any new Medicines after you have completely understood and accpet all the possible adverse reactions/side effects.   Do not drive or operate heavy machinery when taking Pain medications.   Do not take more than prescribed Pain, Sleep and Anxiety Medications  Special Instructions: If you have smoked or chewed Tobacco  in the last 2 yrs please stop smoking, stop any regular Alcohol  and or any Recreational drug use.  Wear Seat belts while driving.  Please note You were cared for by a hospitalist during your hospital stay. If you have any questions about your discharge medications or the care you received while you were in the hospital after you are discharged, you can call the unit and asked to speak  with the hospitalist on call if the hospitalist that took care of you is not available. Once you are discharged, your primary care physician will handle any further medical issues. Please note that NO REFILLS for any discharge medications will be authorized once you are discharged, as it is imperative that you return to your primary care physician (or establish a relationship with a primary care physician if you do not have one) for your aftercare needs so that they can reassess your need for medications and monitor your lab values.  You can reach the hospitalist office at phone [phone] or fax [phone]   If you do not have a primary care physician, you can call [phone] for a physician referral.  Activity: As tolerated with Full fall precautions use walker/cane & assistance as needed  Diet: regular  Disposition Home

## 2017-03-10 ENCOUNTER — Encounter: Payer: Self-pay | Admitting: Cardiology

## 2017-03-10 ENCOUNTER — Ambulatory Visit (INDEPENDENT_AMBULATORY_CARE_PROVIDER_SITE_OTHER): Payer: PPO | Admitting: Cardiology

## 2017-03-10 VITALS — BP 122/72 | HR 78 | Ht 66.0 in | Wt 235.0 lb

## 2017-03-10 DIAGNOSIS — R0789 Other chest pain: Secondary | ICD-10-CM

## 2017-03-10 DIAGNOSIS — E875 Hyperkalemia: Secondary | ICD-10-CM

## 2017-03-10 DIAGNOSIS — R002 Palpitations: Secondary | ICD-10-CM

## 2017-03-10 DIAGNOSIS — R000 Tachycardia, unspecified: Secondary | ICD-10-CM

## 2017-03-10 DIAGNOSIS — I2782 Chronic pulmonary embolism: Secondary | ICD-10-CM | POA: Diagnosis not present

## 2017-03-10 MED ORDER — BENZONATATE 100 MG PO CAPS: 100.0000 mg | ORAL_CAPSULE | Freq: Three times a day (TID) | ORAL | 0 refills | Status: DC | PRN

## 2017-03-10 NOTE — Addendum Note (Signed)
Addended by: Arville CareHUNT, Charles Andringa N on: 03/10/2017 10:59 AM   Modules accepted: Orders

## 2017-03-10 NOTE — Patient Instructions (Signed)
Medication Instructions:  Your physician has recommended you make the following change in your medication:  Tessalon Pearls have been sent to your pharmacy. You may take these 3 times a day as needed for your cough.  Labwork: Your physician recommends that you have lab work today: CBC, BMP, Ifob  Testing/Procedures: EKG today  Follow-Up: Your physician recommends that you schedule a follow-up appointment in: 3 weeks with Dr. Bing MatterKrasowski  I have also put in a referral to Pulmonary. They should contact you about an appointment.   Any Other Special Instructions Will Be Listed Below (If Applicable).     If you need a refill on your cardiac medications before your next appointment, please call your pharmacy.

## 2017-03-10 NOTE — Progress Notes (Signed)
Cardiology Office Note:    Date:  03/10/2017   ID:  Gail DullBrenda H Kooy, DOB January 03, 1951, MRN [id number]  PCP:  System, Pcp Not In  Cardiologist:  Gypsy Balsamobert Katye Valek, MD    Referring MD: No ref. provider found   Chief Complaint  Patient presents with  . Hospitalization Follow-up    Blood clots on both sides of the lungs, and and left leg with mild damage to the heart.  I had pulmonary emboli and DVT  History of Present Illness:    Gail Olson is a 67 y.o. female with recently recognized pulmonary emboli which is bilateral and DVT.  Apparently she is being experiencing some shortness of breath as well as swelling of left lower extremity and up going to urgent care.  She was sent to hospital because of high d-dimer CT showed bilateral pulmonary emboli.  [facility] she was put on anticoagulation and discharged home.  While in the hospital she was hemodynamically stable.  Her echocardiogram showed mild enlargement and hypokinesis of the right ventricle pulmonary artery pressure was assessed as 47 mmHg.  She is doing fair diluting complaint is cough and some pleuritic pain for which she takes Tylenol with partial relief.  She does have some shortness of breath quite easily with exertion.  Leg does not hurt anymore.  She does have a lot of questions regarding what happened to her and I explained to her with pulmonary emboli is what is potential reason for that.  For however from what I can see unprovoked pulmonary emboli with DVT and she will most likely required an indefinite duration of anticoagulation.  She did have 1 miscarriage: She does have 3 children, there is no family history of unprovoked DVT apparently her 1 of her family member ended up having some blood clot in the leg after leg fracture and cast being put on. I did see this lady before because of palpitations which appears to be nothing critical.  Past Medical History:  Diagnosis Date  . Arthritis   .  Thyroid disease     Past Surgical History:  Procedure Laterality Date  . APPENDECTOMY    . BACK SURGERY    . CHOLECYSTECTOMY      Current Medications: Current Meds  Medication Sig  . acetaminophen (TYLENOL) 650 MG CR tablet Take 650 mg by mouth every 8 (eight) hours as needed for pain.  Marland Kitchen. albuterol (PROAIR HFA) 108 (90 Base) MCG/ACT inhaler Inhale 2 puffs into the lungs See admin instructions. 2 puffs every four to six hours as needed for shortness of breath  . albuterol (PROVENTIL) (2.5 MG/3ML) 0.083% nebulizer solution Take 2.5 mg by nebulization every 4 (four) hours as needed for wheezing or shortness of breath (AS DIRECTED BY MD).   Marland Kitchen. apixaban (ELIQUIS) 5 MG TABS tablet Take 2 tablets (10 mg total) by mouth 2 (two) times daily. Take 2 tablets twice daily for 7 days then 1 tablet twice daily. First dose Sat 03/07/2017  . betamethasone dipropionate (DIPROLENE) 0.05 % ointment Apply 1 application topically 2 (two) times daily. aaa  . Dextromethorphan-Guaifenesin (CORICIDIN HBP CONGESTION/COUGH) 10-200 MG CAPS Take 1 capsule by mouth every 8 (eight) hours as needed (for cold-like symptoms).  . fluticasone (FLONASE) 50 MCG/ACT nasal spray Place 2 sprays into both nostrils at bedtime as needed for allergies or rhinitis.   Marland Kitchen. ibuprofen (ADVIL,MOTRIN) 200 MG tablet Take 400 mg by mouth every 6 (six) hours as needed (for pain).  .Marland Kitchen  levothyroxine (SYNTHROID, LEVOTHROID) 50 MCG tablet Take 50 mcg by mouth every Monday, Wednesday, and Friday.  . levothyroxine (SYNTHROID, LEVOTHROID) 75 MCG tablet Take 75 mcg by mouth every Tuesday, Thursday, Saturday, and Sunday.  . meclizine (ANTIVERT) 25 MG tablet Take 25 mg by mouth 3 (three) times daily as needed for dizziness.   . methocarbamol (ROBAXIN) 750 MG tablet Take 750 mg by mouth 3 (three) times daily as needed for muscle spasms.   . metoprolol succinate (TOPROL-XL) 25 MG 24 hr tablet Take 25 mg by mouth at bedtime.   Marland Kitchen omeprazole (PRILOSEC) 20 MG capsule  Take 20 mg by mouth daily as needed (for reflux).   Marland Kitchen OVER THE COUNTER MEDICATION Remefemin tablets: Take 1 tablet by mouth two to three times a week  . UNABLE TO FIND Compression stockings, daily     Allergies:   Other; Prednisone; Risedronate; Codeine; and Penicillins   Social History   Socioeconomic History  . Marital status: Married    Spouse name: None  . Number of children: None  . Years of education: None  . Highest education level: None  Social Needs  . Financial resource strain: None  . Food insecurity - worry: None  . Food insecurity - inability: None  . Transportation needs - medical: None  . Transportation needs - non-medical: None  Occupational History  . None  Tobacco Use  . Smoking status: Never Smoker  . Smokeless tobacco: Never Used  Substance and Sexual Activity  . Alcohol use: No    Frequency: Never  . Drug use: No  . Sexual activity: None  Other Topics Concern  . None  Social History Narrative  . None     Family History: The patient's family history includes Cancer in her mother; Hypertension in her brother, mother, and sister. There is no history of Pulmonary disease. ROS:   Please see the history of present illness.    All 14 point review of systems negative except as described per history of present illness  EKGs/Labs/Other Studies Reviewed:      Recent Labs: 02/26/2017: B Natriuretic Peptide 13.9 02/28/2017: BUN 7; Creatinine, Ser 0.73; Hemoglobin 12.8; Platelets 220; Potassium 3.8; Sodium 139  Recent Lipid Panel No results found for: CHOL, TRIG, HDL, CHOLHDL, VLDL, LDLCALC, LDLDIRECT  Physical Exam:    VS:  BP 122/72   Pulse 78   Ht 5\' 6"  (1.676 m)   Wt 235 lb (106.6 kg)   SpO2 98%   BMI 37.93 kg/m     Wt Readings from Last 3 Encounters:  03/10/17 235 lb (106.6 kg)  02/26/17 238 lb 1.6 oz (108 kg)     GEN:  Well nourished, well developed in no acute distress HEENT: Normal NECK: No JVD; No carotid bruits LYMPHATICS: No  lymphadenopathy CARDIAC: RRR, no murmurs, no rubs, no gallops RESPIRATORY:  Clear to auscultation without rales, wheezing or rhonchi  ABDOMEN: Soft, non-tender, non-distended MUSCULOSKELETAL:  No edema; No deformity  SKIN: Warm and dry LOWER EXTREMITIES: no swelling NEUROLOGIC:  Alert and oriented x 3 PSYCHIATRIC:  Normal affect   ASSESSMENT:    1. Other chronic pulmonary embolism without acute cor pulmonale (HCC)   2. Atypical chest pain   3. Palpitations   4. Sinus tachycardia    PLAN:    In order of problems listed above:  1. Pulmonary emboli bilateral.  On anticoagulation I stressed the importance of taking this on the regular basis.  My intention is to see this lady back in  about 3 weeks and recheck her echocardiogram at that time.  I am worried about pulmonary artery pressure being elevated already at the level of 47 mmHg.  She also requested to be seen by pulmonologist which I think it is an excellent idea.  She will be referred to our pulmonary clinic. 2. Atypical chest pain or more likely pleuritic pain right now.  She takes Tylenol for it with good response will continue. 3. Palpitations: There is a complaint of vaccine for years ago that seems to be stable denies having any issues. 4. Sinus tachycardia: Most likely related to pulmonary emboli.  We will do pulse oximetry today: EKG: CBC: I will ask her to have stool for guaiac done.  We need to investigate for any potential cancer.  In my opinion she will needs to have colonoscopy done mammogram need to be follow-up as well.   Medication Adjustments/Labs and Tests Ordered: Current medicines are reviewed at length with the patient today.  Concerns regarding medicines are outlined above.  No orders of the defined types were placed in this encounter.  Medication changes: No orders of the defined types were placed in this encounter.   Signed, Georgeanna Lea, MD, [facility] 03/10/2017 10:41 AM    [facility]

## 2017-03-11 LAB — BASIC METABOLIC PANEL
BUN / CREAT RATIO: 16 (ref 12–28)
BUN: 12 mg/dL (ref 8–27)
CHLORIDE: 102 mmol/L (ref 96–106)
CO2: 24 mmol/L (ref 20–29)
Calcium: 9.8 mg/dL (ref 8.7–10.3)
Creatinine, Ser: 0.77 mg/dL (ref 0.57–1.00)
GFR calc Af Amer: 92 mL/min/{1.73_m2} (ref >59)
GFR, EST NON AFRICAN AMERICAN: 80 mL/min/{1.73_m2} (ref >59)
Glucose: 103 mg/dL — ABNORMAL HIGH (ref 65–99)
POTASSIUM: 5.3 mmol/L — AB (ref 3.5–5.2)
SODIUM: 139 mmol/L (ref 134–144)

## 2017-03-11 LAB — CBC
HEMOGLOBIN: 14.1 g/dL (ref 11.1–15.9)
Hematocrit: 42.3 % (ref 34.0–46.6)
MCH: 28.9 pg (ref 26.6–33.0)
MCHC: 33.3 g/dL (ref 31.5–35.7)
MCV: 87 fL (ref 79–97)
PLATELETS: 266 10*3/uL (ref 150–379)
RBC: 4.88 x10E6/uL (ref 3.77–5.28)
RDW: 14.1 % (ref 12.3–15.4)
WBC: 5.8 10*3/uL (ref 3.4–10.8)

## 2017-03-11 NOTE — Addendum Note (Signed)
Addended by: Craige CottaANDERSON, Yerania Chamorro S on: 03/11/2017 02:40 PM   Modules accepted: Orders

## 2017-03-12 DIAGNOSIS — I2782 Chronic pulmonary embolism: Secondary | ICD-10-CM | POA: Diagnosis not present

## 2017-03-12 DIAGNOSIS — R0789 Other chest pain: Secondary | ICD-10-CM | POA: Diagnosis not present

## 2017-03-12 DIAGNOSIS — I2699 Other pulmonary embolism without acute cor pulmonale: Secondary | ICD-10-CM | POA: Diagnosis not present

## 2017-03-12 DIAGNOSIS — E039 Hypothyroidism, unspecified: Secondary | ICD-10-CM | POA: Diagnosis not present

## 2017-03-12 DIAGNOSIS — Z09 Encounter for follow-up examination after completed treatment for conditions other than malignant neoplasm: Secondary | ICD-10-CM | POA: Diagnosis not present

## 2017-03-12 DIAGNOSIS — I824Z2 Acute embolism and thrombosis of unspecified deep veins of left distal lower extremity: Secondary | ICD-10-CM | POA: Diagnosis not present

## 2017-03-12 DIAGNOSIS — R002 Palpitations: Secondary | ICD-10-CM | POA: Diagnosis not present

## 2017-03-12 DIAGNOSIS — R000 Tachycardia, unspecified: Secondary | ICD-10-CM | POA: Diagnosis not present

## 2017-03-15 LAB — FECAL OCCULT BLOOD, IMMUNOCHEMICAL: Fecal Occult Bld: NEGATIVE

## 2017-03-18 ENCOUNTER — Emergency Department (HOSPITAL_COMMUNITY): Payer: PPO

## 2017-03-18 ENCOUNTER — Other Ambulatory Visit: Payer: Self-pay

## 2017-03-18 ENCOUNTER — Encounter (HOSPITAL_COMMUNITY): Payer: Self-pay | Admitting: *Deleted

## 2017-03-18 ENCOUNTER — Observation Stay (HOSPITAL_COMMUNITY)
Admission: EM | Admit: 2017-03-18 | Discharge: 2017-03-19 | Disposition: A | Discharge status: Home / Self Care | Payer: PPO | Attending: Internal Medicine | Admitting: Internal Medicine

## 2017-03-18 ENCOUNTER — Observation Stay (HOSPITAL_COMMUNITY): Payer: PPO

## 2017-03-18 DIAGNOSIS — M79602 Pain in left arm: Secondary | ICD-10-CM | POA: Insufficient documentation

## 2017-03-18 DIAGNOSIS — R072 Precordial pain: Secondary | ICD-10-CM

## 2017-03-18 DIAGNOSIS — I82402 Acute embolism and thrombosis of unspecified deep veins of left lower extremity: Secondary | ICD-10-CM | POA: Diagnosis not present

## 2017-03-18 DIAGNOSIS — Z880 Allergy status to penicillin: Secondary | ICD-10-CM | POA: Diagnosis not present

## 2017-03-18 DIAGNOSIS — R262 Difficulty in walking, not elsewhere classified: Secondary | ICD-10-CM | POA: Insufficient documentation

## 2017-03-18 DIAGNOSIS — I2699 Other pulmonary embolism without acute cor pulmonale: Secondary | ICD-10-CM | POA: Diagnosis not present

## 2017-03-18 DIAGNOSIS — Z91048 Other nonmedicinal substance allergy status: Secondary | ICD-10-CM | POA: Insufficient documentation

## 2017-03-18 DIAGNOSIS — Z791 Long term (current) use of non-steroidal anti-inflammatories (NSAID): Secondary | ICD-10-CM | POA: Insufficient documentation

## 2017-03-18 DIAGNOSIS — R079 Chest pain, unspecified: Secondary | ICD-10-CM | POA: Diagnosis present

## 2017-03-18 DIAGNOSIS — Z888 Allergy status to other drugs, medicaments and biological substances status: Secondary | ICD-10-CM | POA: Insufficient documentation

## 2017-03-18 DIAGNOSIS — Z886 Allergy status to analgesic agent status: Secondary | ICD-10-CM | POA: Insufficient documentation

## 2017-03-18 DIAGNOSIS — E039 Hypothyroidism, unspecified: Secondary | ICD-10-CM | POA: Diagnosis not present

## 2017-03-18 DIAGNOSIS — Z885 Allergy status to narcotic agent status: Secondary | ICD-10-CM | POA: Insufficient documentation

## 2017-03-18 DIAGNOSIS — E559 Vitamin D deficiency, unspecified: Secondary | ICD-10-CM | POA: Diagnosis not present

## 2017-03-18 DIAGNOSIS — K219 Gastro-esophageal reflux disease without esophagitis: Secondary | ICD-10-CM | POA: Diagnosis not present

## 2017-03-18 DIAGNOSIS — J45909 Unspecified asthma, uncomplicated: Secondary | ICD-10-CM | POA: Diagnosis not present

## 2017-03-18 DIAGNOSIS — R0789 Other chest pain: Secondary | ICD-10-CM | POA: Insufficient documentation

## 2017-03-18 DIAGNOSIS — R0602 Shortness of breath: Secondary | ICD-10-CM | POA: Diagnosis not present

## 2017-03-18 DIAGNOSIS — Z1231 Encounter for screening mammogram for malignant neoplasm of breast: Secondary | ICD-10-CM | POA: Diagnosis not present

## 2017-03-18 DIAGNOSIS — M549 Dorsalgia, unspecified: Secondary | ICD-10-CM | POA: Insufficient documentation

## 2017-03-18 DIAGNOSIS — E875 Hyperkalemia: Secondary | ICD-10-CM | POA: Diagnosis not present

## 2017-03-18 DIAGNOSIS — R002 Palpitations: Secondary | ICD-10-CM | POA: Diagnosis not present

## 2017-03-18 DIAGNOSIS — Z79899 Other long term (current) drug therapy: Secondary | ICD-10-CM | POA: Insufficient documentation

## 2017-03-18 HISTORY — DX: Other pulmonary embolism without acute cor pulmonale: I26.99

## 2017-03-18 HISTORY — DX: Acute embolism and thrombosis of unspecified deep veins of unspecified lower extremity: I82.409

## 2017-03-18 HISTORY — DX: Chest pain, unspecified: R07.9

## 2017-03-18 LAB — BASIC METABOLIC PANEL
Anion gap: 9 (ref 5–15)
BUN: 10 mg/dL (ref 6–20)
CALCIUM: 9.4 mg/dL (ref 8.9–10.3)
CO2: 24 mmol/L (ref 22–32)
CREATININE: 0.76 mg/dL (ref 0.44–1.00)
Chloride: 106 mmol/L (ref 101–111)
GFR calc Af Amer: >60 mL/min (ref >60)
GFR calc non Af Amer: >60 mL/min (ref >60)
GLUCOSE: 93 mg/dL (ref 65–99)
Potassium: 3.8 mmol/L (ref 3.5–5.1)
Sodium: 139 mmol/L (ref 135–145)

## 2017-03-18 LAB — CBC
HCT: 42.4 % (ref 36.0–46.0)
HEMOGLOBIN: 13.9 g/dL (ref 12.0–15.0)
MCH: 29.3 pg (ref 26.0–34.0)
MCHC: 32.8 g/dL (ref 30.0–36.0)
MCV: 89.3 fL (ref 78.0–100.0)
PLATELETS: 236 10*3/uL (ref 150–400)
RBC: 4.75 MIL/uL (ref 3.87–5.11)
RDW: 13.8 % (ref 11.5–15.5)
WBC: 7.3 10*3/uL (ref 4.0–10.5)

## 2017-03-18 LAB — I-STAT TROPONIN, ED: TROPONIN I, POC: 0 ng/mL (ref 0.00–0.08)

## 2017-03-18 MED ORDER — MORPHINE SULFATE (PF) 4 MG/ML IV SOLN: 2.0000 mg | Freq: Once | INTRAVENOUS | Status: DC

## 2017-03-18 MED ORDER — ONDANSETRON HCL 4 MG/2ML IJ SOLN
4.0000 mg | Freq: Four times a day (QID) | INTRAMUSCULAR | Status: DC | PRN
Start: 2017-03-18 — End: 2017-03-19

## 2017-03-18 MED ORDER — METOPROLOL SUCCINATE ER 25 MG PO TB24
25.0000 mg | ORAL_TABLET | Freq: Every day | ORAL | Status: DC
  Administered 2017-03-19: 25 mg via ORAL
  Filled 2017-03-18: qty 1

## 2017-03-18 MED ORDER — ONDANSETRON HCL 4 MG PO TABS: 4.0000 mg | ORAL_TABLET | Freq: Four times a day (QID) | ORAL | Status: DC | PRN

## 2017-03-18 MED ORDER — APIXABAN 5 MG PO TABS
5.0000 mg | ORAL_TABLET | Freq: Two times a day (BID) | ORAL | Status: DC
  Administered 2017-03-19 (×2): 5 mg via ORAL
  Filled 2017-03-18 (×2): qty 1

## 2017-03-18 MED ORDER — LEVOTHYROXINE SODIUM 75 MCG PO TABS
75.0000 ug | ORAL_TABLET | ORAL | Status: DC
  Administered 2017-03-19: 75 ug via ORAL
  Filled 2017-03-18: qty 1

## 2017-03-18 MED ORDER — LEVOTHYROXINE SODIUM 50 MCG PO TABS: 50.0000 ug | ORAL_TABLET | ORAL | Status: DC

## 2017-03-18 MED ORDER — PANTOPRAZOLE SODIUM 40 MG PO TBEC
40.0000 mg | DELAYED_RELEASE_TABLET | Freq: Every day | ORAL | Status: DC
  Administered 2017-03-19: 40 mg via ORAL
  Filled 2017-03-18: qty 1

## 2017-03-18 MED ORDER — ALBUTEROL SULFATE (2.5 MG/3ML) 0.083% IN NEBU: 2.5000 mg | INHALATION_SOLUTION | RESPIRATORY_TRACT | Status: DC | PRN

## 2017-03-18 MED ORDER — FLUTICASONE PROPIONATE 50 MCG/ACT NA SUSP
2.0000 | Freq: Every evening | NASAL | Status: DC | PRN
  Filled 2017-03-18: qty 16

## 2017-03-18 MED ORDER — IOPAMIDOL (ISOVUE-370) INJECTION 76%
INTRAVENOUS | Status: AC
  Administered 2017-03-18: 100 mL
  Filled 2017-03-18: qty 100

## 2017-03-18 MED ORDER — ACETAMINOPHEN 650 MG RE SUPP: 650.0000 mg | Freq: Four times a day (QID) | RECTAL | Status: DC | PRN

## 2017-03-18 MED ORDER — HYDROCODONE-ACETAMINOPHEN 5-325 MG PO TABS
1.0000 | ORAL_TABLET | Freq: Three times a day (TID) | ORAL | Status: DC | PRN
Start: 2017-03-18 — End: 2017-03-19
  Administered 2017-03-19 (×2): 1 via ORAL
  Filled 2017-03-18 (×2): qty 1

## 2017-03-18 MED ORDER — IOPAMIDOL (ISOVUE-370) INJECTION 76%
INTRAVENOUS | Status: AC
  Filled 2017-03-18: qty 100

## 2017-03-18 MED ORDER — ACETAMINOPHEN 325 MG PO TABS
650.0000 mg | ORAL_TABLET | Freq: Four times a day (QID) | ORAL | Status: DC | PRN
  Administered 2017-03-19: 650 mg via ORAL
  Filled 2017-03-18: qty 2

## 2017-03-18 NOTE — ED Triage Notes (Signed)
Pt has hx of PE, recently in hospital. Pt having left arm, chest and pain into her shoulder/back. Has ongoing sob but no acute resp distress is noted. ekg done at triage.

## 2017-03-18 NOTE — H&P (Signed)
History and Physical    [name]:[id number] DOB: 09-26-1950 DOA: 03/18/2017  PCP: System, Pcp Not In  Patient coming from: Home.  Chief Complaint: Chest pain.  HPI: Gail Olson is a 67 y.o. female with recently diagnosed bilateral pulmonary embolism and was discharged 3 weeks ago on December 29 on apixaban has been experiencing increasing left sided chest pain and axillary pain for the last few days which is acutely worsened yesterday.  Patient primary care physician is doing workup for ruling out malignancy since patient had unprovoked pulmonary embolism.  Patient had gone to have her mammogram done today during which patient's pain worsened with increasing shortness of breath and was referred to the ER.  ED Course: In the ER patient was still complaining of pain mostly in the left axillary area which increases on movement mostly on reduction of the left arm.  There is also pain in the left lateral chest wall.  Pain is associated with shortness of breath denies any productive cough fever or chills.  Troponin and EKG are unremarkable.  CT angiogram of the chest has been ordered again to rule out any worsening pulmonary embolism despite patient being on apixaban.  Patient states he has been compliant with her medications.  Review of Systems: As per HPI, rest all negative.   Past Medical History:  Diagnosis Date  . Arthritis   . DVT (deep venous thrombosis) (HCC)   . Pulmonary embolism (HCC)   . Thyroid disease     Past Surgical History:  Procedure Laterality Date  . APPENDECTOMY    . BACK SURGERY    . CHOLECYSTECTOMY       reports that  has never smoked. she has never used smokeless tobacco. She reports that she does not drink alcohol or use drugs.  Allergies  Allergen Reactions  . Nsaids Other (See Comments)    Not suppose to take with her blood thinner  . Other Other (See Comments)    Extreme stomach pain if any of these are eaten: spicy foods, onions,  fried foods, cauliflower, and broccoli  . Prednisone Other (See Comments)    Not suppose to take due to advanced osteoporosis  . Risedronate Other (See Comments)    (Actonel) Caused EXTREME JAW PAIN and muscles and joints ached   . Adhesive [Tape] Rash and Other (See Comments)    EKG pads left welts on the skin  . Codeine Palpitations and Other (See Comments)    Tachycardia   . Penicillins Rash    Has patient had a PCN reaction causing immediate rash, facial/tongue/throat swelling, SOB or lightheadedness with hypotension: Yes Has patient had a PCN reaction causing severe rash involving mucus membranes or skin necrosis: Unk Has patient had a PCN reaction that required hospitalization: Unk Has patient had a PCN reaction occurring within the last 10 years: Unk If all of the above answers are "NO", then may proceed with Cephalosporin use.     Family History  Problem Relation Age of Onset  . Hypertension Mother   . Cancer Mother   . Hypertension Sister   . Hypertension Brother   . Pulmonary disease Neg Hx     Prior to Admission medications   Medication Sig Start Date End Date Taking? Authorizing Provider  acetaminophen (TYLENOL) 650 MG CR tablet Take 650 mg by mouth every 8 (eight) hours as needed for pain.   Yes [provider]  albuterol (PROAIR HFA) 108 (90 Base) MCG/ACT inhaler Inhale 2 puffs  into the lungs See admin instructions. 2 puffs every four to six hours as needed for shortness of breath   Yes [provider]  albuterol (PROVENTIL) (2.5 MG/3ML) 0.083% nebulizer solution Take 2.5 mg by nebulization every 4 (four) hours as needed for wheezing or shortness of breath (AS DIRECTED BY MD).  12/04/15  Yes [provider]  apixaban (ELIQUIS) 5 MG TABS tablet Take 2 tablets (10 mg total) by mouth 2 (two) times daily. Take 2 tablets twice daily for 7 days then 1 tablet twice daily. First dose Sat 03/07/2017 Patient taking differently: Take 5 mg by mouth 2 (two)  times daily.  02/28/17  Yes Gherghe, Daylene Katayamaostin M, MD  benzonatate (TESSALON PERLES) 100 MG capsule Take 1 capsule (100 mg total) by mouth 3 (three) times daily as needed for cough. 03/10/17  Yes [name], [name], MD  betamethasone dipropionate (DIPROLENE) 0.05 % ointment Apply 1 application topically 2 (two) times daily as needed (to affected areas).  01/09/17  Yes [provider]  fluticasone (FLONASE) 50 MCG/ACT nasal spray Place 2 sprays into both nostrils at bedtime as needed for allergies or rhinitis.  09/08/16  Yes [provider]  hydrochlorothiazide (MICROZIDE) 12.5 MG capsule Take 12.5 mg by mouth daily as needed for edema or fluid. 03/12/17  Yes [provider]  HYDROcodone-acetaminophen (NORCO/VICODIN) 5-325 MG tablet Take 1 tablet by mouth every 8 (eight) hours as needed for pain. 03/12/17  Yes [provider]  levothyroxine (SYNTHROID, LEVOTHROID) 50 MCG tablet Take 50 mcg by mouth every Monday, Wednesday, and Friday. 10/06/16  Yes [provider]  levothyroxine (SYNTHROID, LEVOTHROID) 75 MCG tablet Take 75 mcg by mouth every Tuesday, Thursday, Saturday, and Sunday. 10/29/15  Yes [provider]  meclizine (ANTIVERT) 25 MG tablet Take 25 mg by mouth 3 (three) times daily as needed for dizziness.  12/13/16  Yes [provider]  methocarbamol (ROBAXIN) 750 MG tablet Take 750 mg by mouth 3 (three) times daily as needed for muscle spasms.  06/27/16  Yes [provider]  metoprolol succinate (TOPROL-XL) 25 MG 24 hr tablet Take 25 mg by mouth at bedtime.  02/09/17  Yes [provider]  omeprazole (PRILOSEC) 20 MG capsule Take 20 mg by mouth daily as needed (for reflux).  02/06/17  Yes [provider]  sulfamethoxazole-trimethoprim (BACTRIM DS,SEPTRA DS) 800-160 MG tablet Take 1 tablet by mouth 2 (two) times daily as needed (as directed for UTI(s)).   Yes [provider]  UNABLE TO FIND Compression stockings,  daily 02/28/17  Yes Gherghe, Daylene Katayamaostin M, MD  OVER THE COUNTER MEDICATION Remefemin tablets: Take 1 tablet by mouth two to three times a week    [provider]    Physical Exam: Vitals:   03/18/17 1706 03/18/17 1903 03/18/17 2200  BP: (!) 145/76 (!) 145/82 (!) 145/61  Pulse: 84 93 89  Resp: 18 20 12   Temp: (!) 97.4 F (36.3 C)    TempSrc: Oral    SpO2: 98% 100% 95%      Constitutional: Moderately built and nourished. Vitals:   03/18/17 1706 03/18/17 1903 03/18/17 2200  BP: (!) 145/76 (!) 145/82 (!) 145/61  Pulse: 84 93 89  Resp: 18 20 12   Temp: (!) 97.4 F (36.3 C)    TempSrc: Oral    SpO2: 98% 100% 95%   Eyes: Anicteric no pallor. ENMT: No discharge from the ears eyes nose or mouth. Neck: No mass felt.  No JVD appreciated. Respiratory: No rhonchi  or crepitations. Cardiovascular: S1-S2 heard no murmurs appreciated. Abdomen: Soft nontender bowel sounds present. Musculoskeletal: No edema.  No joint effusion. Skin: No rash.  Skin appears warm. Neurologic: Alert awake oriented to time place and person.  Moves all extremities. Psychiatric: Appears normal.  Normal affect.   Labs on Admission: I have personally reviewed following labs and imaging studies  CBC: Recent Labs  Lab 03/18/17 1710  WBC 7.3  HGB 13.9  HCT 42.4  MCV 89.3  PLT 236   Basic Metabolic Panel: Recent Labs  Lab 03/18/17 1710  NA 139  K 3.8  CL 106  CO2 24  GLUCOSE 93  BUN 10  CREATININE 0.76  CALCIUM 9.4   GFR: Estimated Creatinine Clearance: 83.1 mL/min (by C-G formula based on SCr of 0.76 mg/dL). Liver Function Tests: No results for input(s): AST, ALT, ALKPHOS, BILITOT, PROT, ALBUMIN in the last 168 hours. No results for input(s): LIPASE, AMYLASE in the last 168 hours. No results for input(s): AMMONIA in the last 168 hours. Coagulation Profile: No results for input(s): INR, PROTIME in the last 168 hours. Cardiac Enzymes: No results for input(s): CKTOTAL, CKMB, CKMBINDEX,  TROPONINI in the last 168 hours. BNP (last 3 results) No results for input(s): PROBNP in the last 8760 hours. HbA1C: No results for input(s): HGBA1C in the last 72 hours. CBG: No results for input(s): GLUCAP in the last 168 hours. Lipid Profile: No results for input(s): CHOL, HDL, LDLCALC, TRIG, CHOLHDL, LDLDIRECT in the last 72 hours. Thyroid Function Tests: No results for input(s): TSH, T4TOTAL, FREET4, T3FREE, THYROIDAB in the last 72 hours. Anemia Panel: No results for input(s): VITAMINB12, FOLATE, FERRITIN, TIBC, IRON, RETICCTPCT in the last 72 hours. Urine analysis: No results found for: COLORURINE, APPEARANCEUR, LABSPEC, PHURINE, GLUCOSEU, HGBUR, BILIRUBINUR, KETONESUR, PROTEINUR, UROBILINOGEN, NITRITE, LEUKOCYTESUR Sepsis Labs: @LABRCNTIP (procalcitonin:4,lacticidven:4) ) Recent Results (from the past 240 hour(s))  Fecal occult blood, imunochemical     Status: None   Collection Time: 03/12/17 12:00 AM  Result Value Ref Range Status   Fecal Occult Bld Negative Negative Final     Radiological Exams on Admission: Dg Chest 2 View  Result Date: 03/18/2017 CLINICAL DATA:  Blood clots in the left leg and left arm last year. EXAM: CHEST  2 VIEW COMPARISON:  CT chest 02/26/2017 FINDINGS: The heart size and mediastinal contours are within normal limits. Both lungs are clear. The visualized skeletal structures are unremarkable. IMPRESSION: No active cardiopulmonary disease. Electronically Signed   By: Elige Ko   On: 03/18/2017 17:46    EKG: Independently reviewed.  Normal sinus rhythm.  Assessment/Plan Principal Problem:   Chest pain Active Problems:   Pulmonary embolism (HCC)   Hypothyroidism    1. Chest pain with left arm pain -appears atypical.  Pain increases on moving her left shoulder.  However since patient also have associated shortness of breath CT angiogram of the chest has been ordered to rule out any worsening PE despite patient being on apixaban.  We will cycle  cardiac markers check Dopplers of the left upper extremity.  Patient did have mammogram done yesterday and results of which are not available at this time will need to check with primary care in the morning. 2. History of palpitation on metoprolol. 3. Hypothyroidism on Synthroid. 4. Recent pulmonary embolism on apixaban.  Follow CT chest.   DVT prophylaxis: Apixaban. Code Status: Full code. Family Communication: Patient's husband. Disposition Plan: Home. Consults called: None. Admission status: Observation.   Eduard Clos MD [facility] Pager  [phone].  If 7PM-7AM, please contact night-coverage [email or web address] Password [facility]  03/18/2017, 11:35 PM

## 2017-03-18 NOTE — ED Provider Notes (Signed)
Emergency Department Provider Note   I have reviewed the triage vital signs and the nursing notes.   HISTORY  Chief Complaint Arm Pain; Chest Pain; and Shortness of Breath   HPI Gail Olson is a 67 y.o. female with PMH of DVT/PE on Eliquis and Thyroid disease presents to the emergency department for evaluation of intermittent left arm and chest pain radiating to the shoulder and back.  Patient has had intermittent symptoms since discharge from the hospital with pulmonary embolism.  Symptoms are worsened significantly over the past several days.  She has ongoing shortness of breath but nothing suddenly worse.  No fevers, chills, productive cough.  No history of coronary artery disease or heart catheterization.  No radiation of symptoms.  No modifying factors.   Past Medical History:  Diagnosis Date  . Arthritis   . DVT (deep venous thrombosis) (HCC)   . Pulmonary embolism (HCC)   . Thyroid disease     Patient Active Problem List   Diagnosis Date Noted  . Chest pain 03/18/2017  . Pulmonary embolism (HCC) 02/26/2017  . Hypothyroidism 02/26/2017  . Atypical chest pain 11/01/2015  . Palpitations 11/01/2015  . Nodule of right lung 10/25/2015  . Dyspnea and respiratory abnormalities 08/01/2015  . Sinus tachycardia 08/01/2015  . Gastroesophageal reflux disease without esophagitis 05/24/2014  . Elevated blood-pressure reading without diagnosis of hypertension 05/24/2014  . Lichen sclerosus et atrophicus 12/16/2013  . Vitamin D deficiency 12/16/2013    Past Surgical History:  Procedure Laterality Date  . APPENDECTOMY    . BACK SURGERY    . CHOLECYSTECTOMY      Current Outpatient Rx  . Order #: [id number] Class: Historical Med  . Order #: [id number] Class: Historical Med  . Order #: [id number] Class: Historical Med  . Order #: [id number] Class: Print  . Order #: [id number] Class: Normal  . Order #: [id number] Class: Historical Med  . Order #: [id number] Class: Historical Med    . Order #: [id number] Class: Historical Med  . Order #: [id number] Class: Historical Med  . Order #: [id number] Class: Historical Med  . Order #: [id number] Class: Historical Med  . Order #: [id number] Class: Historical Med  . Order #: [id number] Class: Historical Med  . Order #: [id number] Class: Historical Med  . Order #: [id number] Class: Historical Med  . Order #: [id number] Class: Historical Med  . Order #: [id number] Class: Print  . Order #: [id number] Class: Historical Med    Allergies Nsaids; Other; Prednisone; Risedronate; Adhesive [tape]; Codeine; and Penicillins  Family History  Problem Relation Age of Onset  . Hypertension Mother   . Cancer Mother   . Hypertension Sister   . Hypertension Brother   . Pulmonary disease Neg Hx     Social History Social History   Tobacco Use  . Smoking status: Never Smoker  . Smokeless tobacco: Never Used  Substance Use Topics  . Alcohol use: No    Frequency: Never  . Drug use: No    Review of Systems  Constitutional: No fever/chills Eyes: No visual changes. ENT: No sore throat. Cardiovascular: Positive chest/arm pain. Respiratory: Positive shortness of breath. Gastrointestinal: No abdominal pain.  No nausea, no vomiting.  No diarrhea.  No constipation. Genitourinary: Negative for dysuria. Musculoskeletal: Negative for back pain. Skin: Negative for rash. Neurological: Negative for headaches, focal weakness or numbness.  10-point ROS otherwise negative.  ____________________________________________   PHYSICAL EXAM:  VITAL SIGNS: ED Triage Vitals  Enc Vitals Group     BP 03/18/17 1706 (!) 145/76     Pulse Rate 03/18/17 1706 84  Resp 03/18/17 1706 18     Temp 03/18/17 1706 (!) 97.4 F (36.3 C)     Temp Source 03/18/17 1706 Oral     SpO2 03/18/17 1706 98 %     Pain Score 03/18/17 1704 6    Constitutional: Alert and oriented. Well appearing and in no acute distress. Eyes: Conjunctivae are normal.  Head: Atraumatic. Nose: No  congestion/rhinnorhea. Mouth/Throat: Mucous membranes are moist.  Neck: No stridor.   Cardiovascular: Normal rate, regular rhythm. Good peripheral circulation. Grossly normal heart sounds.   Respiratory: Normal respiratory effort.  No retractions. Lungs CTAB. Gastrointestinal: Soft and nontender. No distention.  Musculoskeletal: No lower extremity tenderness nor edema. No gross deformities of extremities. Neurologic:  Normal speech and language. No gross focal neurologic deficits are appreciated.  Skin:  Skin is warm, dry and intact. No rash noted.  ____________________________________________   LABS (all labs ordered are listed, but only abnormal results are displayed)  Labs Reviewed  BASIC METABOLIC PANEL  CBC  BASIC METABOLIC PANEL  CBC  TROPONIN I  TROPONIN I  TROPONIN I  I-STAT TROPONIN, ED   ____________________________________________  EKG   EKG Interpretation  Date/Time:  Wednesday March 18 2017 17:01:42 EST Ventricular Rate:  84 PR Interval:  134 QRS Duration: 88 QT Interval:  366 QTC Calculation: 432 R Axis:   -41 Text Interpretation:  Normal sinus rhythm Left axis deviation Abnormal ECG No STEMI.  Confirmed by Alona Bene [phone]) on 03/18/2017 11:02:45 PM       ____________________________________________  RADIOLOGY  Dg Chest 2 View  Result Date: 03/18/2017 CLINICAL DATA:  Blood clots in the left leg and left arm last year. EXAM: CHEST  2 VIEW COMPARISON:  CT chest 02/26/2017 FINDINGS: The heart size and mediastinal contours are within normal limits. Both lungs are clear. The visualized skeletal structures are unremarkable. IMPRESSION: No active cardiopulmonary disease. Electronically Signed   By: Elige Ko   On: 03/18/2017 17:46    ____________________________________________   PROCEDURES  Procedure(s) performed:   Procedures  None ____________________________________________   INITIAL IMPRESSION / ASSESSMENT AND PLAN / ED  COURSE  Pertinent labs & imaging results that were available during my care of the patient were reviewed by me and considered in my medical decision making (see chart for details).  Patient with past medical history of pulmonary embolism presents to the emergency department with chest pain.  Shortness of breath is not significantly worse.  No evidence of volume overload on exam.  Patient with no history of coronary artery disease.  Low suspicion for PE but pain is slightly atypical.  She has been compliant with her Eliquis by report.  Plan for repeat CT Angie of the chest given her recent diagnosis of PE.  No evidence of pneumonia or volume overload on chest x-ray.  Even the patient's age and several risk factors she will require admission for chest pain evaluation/rule out from an ACS perspective if CTA is negative.   Discussed patient's case with Hospitalist, Dr. Toniann Fail to request admission. Patient and family (if present) updated with plan. Care transferred Hospitalist service.  I reviewed all nursing notes, vitals, pertinent old records, EKGs, labs, imaging (as available).  ____________________________________________  FINAL CLINICAL IMPRESSION(S) / ED DIAGNOSES  Final diagnoses:  Precordial chest pain     MEDICATIONS GIVEN DURING THIS VISIT:  Medications  morphine 4 MG/ML injection 2 mg (not administered)  albuterol (PROVENTIL) (2.5 MG/3ML) 0.083% nebulizer solution 2.5 mg (not administered)  apixaban (ELIQUIS) tablet 5 mg (  not administered)  fluticasone (FLONASE) 50 MCG/ACT nasal spray 2 spray (not administered)  HYDROcodone-acetaminophen (NORCO/VICODIN) 5-325 MG per tablet 1 tablet (not administered)  levothyroxine (SYNTHROID, LEVOTHROID) tablet 50 mcg (not administered)  levothyroxine (SYNTHROID, LEVOTHROID) tablet 75 mcg (not administered)  metoprolol succinate (TOPROL-XL) 24 hr tablet 25 mg (not administered)  pantoprazole (PROTONIX) EC tablet 40 mg (not administered)   acetaminophen (TYLENOL) tablet 650 mg (not administered)    Or  acetaminophen (TYLENOL) suppository 650 mg (not administered)  ondansetron (ZOFRAN) tablet 4 mg (not administered)    Or  ondansetron (ZOFRAN) injection 4 mg (not administered)  iopamidol (ISOVUE-370) 76 % injection (100 mLs  Contrast Given 03/18/17 2343)    Note:  This document was prepared using Dragon voice recognition software and may include unintentional dictation errors.  Alona BeneJoshua Nykole Matos, MD Emergency Medicine    Nadean Montanaro, Arlyss RepressJoshua G, MD 03/19/17 [phone]

## 2017-03-18 NOTE — ED Notes (Signed)
ED Provider at bedside. 

## 2017-03-18 NOTE — ED Notes (Signed)
Patient transported to CT 

## 2017-03-19 ENCOUNTER — Observation Stay (HOSPITAL_BASED_OUTPATIENT_CLINIC_OR_DEPARTMENT_OTHER): Payer: PPO

## 2017-03-19 DIAGNOSIS — R079 Chest pain, unspecified: Secondary | ICD-10-CM

## 2017-03-19 DIAGNOSIS — I2782 Chronic pulmonary embolism: Secondary | ICD-10-CM

## 2017-03-19 DIAGNOSIS — E039 Hypothyroidism, unspecified: Secondary | ICD-10-CM | POA: Diagnosis not present

## 2017-03-19 DIAGNOSIS — M79609 Pain in unspecified limb: Secondary | ICD-10-CM | POA: Diagnosis not present

## 2017-03-19 LAB — BASIC METABOLIC PANEL
ANION GAP: 10 (ref 5–15)
BUN / CREAT RATIO: 14 (ref 12–28)
BUN: 10 mg/dL (ref 8–27)
BUN: 11 mg/dL (ref 6–20)
CALCIUM: 9.3 mg/dL (ref 8.7–10.3)
CO2: 21 mmol/L (ref 20–29)
CO2: 22 mmol/L (ref 22–32)
CREATININE: 0.73 mg/dL (ref 0.57–1.00)
CREATININE: 0.77 mg/dL (ref 0.44–1.00)
Calcium: 8.9 mg/dL (ref 8.9–10.3)
Chloride: 105 mmol/L (ref 96–106)
Chloride: 108 mmol/L (ref 101–111)
GFR calc Af Amer: >60 mL/min (ref >60)
GFR calc non Af Amer: 85 mL/min/{1.73_m2} (ref >59)
GFR calc non Af Amer: >60 mL/min (ref >60)
GFR, EST AFRICAN AMERICAN: 98 mL/min/{1.73_m2} (ref >59)
Glucose, Bld: 88 mg/dL (ref 65–99)
Glucose: 93 mg/dL (ref 65–99)
Potassium: 4.1 mmol/L (ref 3.5–5.2)
Potassium: 3.8 mmol/L (ref 3.5–5.1)
SODIUM: 140 mmol/L (ref 135–145)
Sodium: 139 mmol/L (ref 134–144)

## 2017-03-19 LAB — CBC
HEMATOCRIT: 40.5 % (ref 36.0–46.0)
Hemoglobin: 13.6 g/dL (ref 12.0–15.0)
MCH: 30 pg (ref 26.0–34.0)
MCHC: 33.6 g/dL (ref 30.0–36.0)
MCV: 89.2 fL (ref 78.0–100.0)
Platelets: 209 10*3/uL (ref 150–400)
RBC: 4.54 MIL/uL (ref 3.87–5.11)
RDW: 14.3 % (ref 11.5–15.5)
WBC: 7 10*3/uL (ref 4.0–10.5)

## 2017-03-19 LAB — TROPONIN I
Troponin I: <0.03 ng/mL (ref <0.03)
Troponin I: <0.03 ng/mL (ref <0.03)
Troponin I: <0.03 ng/mL (ref <0.03)

## 2017-03-19 LAB — MRSA PCR SCREENING: MRSA by PCR: NEGATIVE

## 2017-03-19 LAB — GLUCOSE, CAPILLARY
GLUCOSE-CAPILLARY: 85 mg/dL (ref 65–99)
GLUCOSE-CAPILLARY: 81 mg/dL (ref 65–99)

## 2017-03-19 MED ORDER — GUAIFENESIN 100 MG/5ML PO SOLN: 5.0000 mL | ORAL | 0 refills | Status: DC | PRN

## 2017-03-19 NOTE — Care Management Obs Status (Signed)
MEDICARE OBSERVATION STATUS NOTIFICATION   Patient Details  Name: Gail Olson MRN: [id number] Date of Birth: [birth year]   Medicare Observation Status Notification Given:  Yes    Leone Havenaylor, Becky Colan Clinton, RN 03/19/2017, 2:19 PM

## 2017-03-19 NOTE — Care Management Note (Signed)
Case Management Note  Patient Details  Name: Gail Olson MRN: [id number] Date of Birth: Jun 11, 1950  Subjective/Objective:  Presents with atypical chest pain, in setting of dvt, currently on eliquis.  She is for dc today with no needs.                  Action/Plan: DC home today, no needs.  Expected Discharge Date:  03/19/17               Expected Discharge Plan:  Home/Self Care  In-House Referral:     Discharge planning Services  CM Consult  Post Acute Care Choice:    Choice offered to:     DME Arranged:    DME Agency:     HH Arranged:    HH Agency:     Status of Service:  Completed, signed off  If discussed at [employer] Length of Stay Meetings, dates discussed:    Additional Comments:  Leone Havenaylor, Cordaro Mukai Clinton, RN 03/19/2017, 2:20 PM

## 2017-03-19 NOTE — Progress Notes (Signed)
VASCULAR LAB PRELIMINARY  PRELIMINARY  PRELIMINARY  PRELIMINARY  Left upper extremity venous duplex completed.    Preliminary report:  There is no obvious evidence of DVT or SVT noted in the left upper extremity.   Darcus Edds, RVT 03/19/2017, 2:06 PM

## 2017-03-19 NOTE — ED Notes (Addendum)
Pt denying need for morphine at this time. Pt aware it is available. Pt given [place] sandwich.

## 2017-03-19 NOTE — Progress Notes (Signed)
Discharge paperwork, meds, and instructions reviewed w/ pt and pts husband. Pt states no questions or concerns. Pt to d/c to home. Pt taken downstairs by wheelchair.

## 2017-03-19 NOTE — ED Notes (Signed)
Per admitting MD

## 2017-03-19 NOTE — Discharge Summary (Addendum)
Physician Discharge Summary  Gail DullBrenda H Olson ZOX:[id number] DOB: August 22, 1950 DOA: 03/18/2017  PCP: System, Pcp Not In  Admit date: 03/18/2017 Discharge date: 03/19/2017  Admitted From: Home Disposition:  Home  Recommendations for Outpatient Follow-up:  1. Follow up with PCP in 1-week 2. Ruled out worsening pulmonary embolism, continue apixaban therapy. 3. Pending result of left upper extremity Doppler ultrasound.  Home Health: No  Equipment/Devices: No   Discharge Condition: Stable CODE STATUS: Full  Diet recommendation: Regular.   Brief/Interim Summary: This is a 67 year old female who presents with chest pain. She does have significant past medical history for recent diagnosis of left lower extremity deep vein thrombosis complicated by bilateral pulmonary embolism, she was discharged December 29 on apixaban. She has been experiencing intermittent chest pain, mid chest, upper epigastrium, not exertional related, along with left axillary pain. On the day of admission while having a mammogram, her left axillary pain worsened. On initial physical examination blood pressure 145/76, heart rate 84, respiratory rate 18, oxygen saturations 98%, temperature 97.4. Her lungs were clear to auscultation bilaterally, heart S1-S2 present rhythmic, abdomen soft, no lower extremity edema. Sodium 139, potassium 3.8, chloride 16, bicarbonate 24, glucose 90, BUN 10, creatinine 0.76, white count 7.3, hemoglobin 13.9, hematocrit 42.4, platelets 236. EKG, normal simus rhythm 82 bom, with left axis deviation, normal intervals. Chest film is hypoinflated, with no infiltrates.   Patient was admitted to the hospital with the working diagnosis of atypical chest pain rule out worsening venous thromboembolism.  1. Atypical chest pain in the setting of deep vein thrombosis left lower extremity DVT and pulmonary embolus. Patient was admitted to the medical unit, placed on a remote telemetry monitor, patient received  analgesics with improvement of her symptoms. Cardiac enzymes were negative, which rule out for acute coronary syndrome. CT angiography with PE protocol showed substantially decrease small residual subacute emboli in the segmental and subsegmental bilateral pulmonary artery branches. Patient was recommended to continue apixaban, and follow up with her primary care physician.  2. Hypothyroidism. Continue levothyroxine.  3. Ambulatory dysfunction. Patient does have significant ambulatory dysfunction, she has been using a cane, recently a rolling walker as long as wheelchair. Significant decrease physical functional capacity, due to deconditioning, and back pain. This rises the concern for a provoked nature of her deep vein thrombosis.  4. Asthma. Patient complaint of persistent cough, will add guaifenesin as needed. Continue albuterol, per her home regimen. May need outpatient pulmonary function testing.   Discharge Diagnoses:  Principal Problem:   Chest pain Active Problems:   Pulmonary embolism (HCC)   Hypothyroidism    Discharge Instructions   Allergies as of 03/19/2017      Reactions   Nsaids Other (See Comments)   Not suppose to take with her blood thinner   Other Other (See Comments)   Extreme stomach pain if any of these are eaten: spicy foods, onions, fried foods, cauliflower, and broccoli   Prednisone Other (See Comments)   Not suppose to take due to advanced osteoporosis   Risedronate Other (See Comments)   (Actonel) Caused EXTREME JAW PAIN and muscles and joints ached   Adhesive [tape] Rash, Other (See Comments)   EKG pads left welts on the skin   Codeine Palpitations, Other (See Comments)   Tachycardia   Penicillins Rash   Has patient had a PCN reaction causing immediate rash, facial/tongue/throat swelling, SOB or lightheadedness with hypotension: Yes Has patient had a PCN reaction causing severe rash involving mucus membranes or skin necrosis: Unk  Has patient had a PCN  reaction that required hospitalization: Unk Has patient had a PCN reaction occurring within the last 10 years: Unk If all of the above answers are "NO", then may proceed with Cephalosporin use.      Medication List    TAKE these medications   acetaminophen 650 MG CR tablet Commonly known as:  TYLENOL Take 650 mg by mouth every 8 (eight) hours as needed for pain.   PROAIR HFA 108 (90 Base) MCG/ACT inhaler Generic drug:  albuterol Inhale 2 puffs into the lungs See admin instructions. 2 puffs every four to six hours as needed for shortness of breath   albuterol (2.5 MG/3ML) 0.083% nebulizer solution Commonly known as:  PROVENTIL Take 2.5 mg by nebulization every 4 (four) hours as needed for wheezing or shortness of breath (AS DIRECTED BY MD).   apixaban 5 MG Tabs tablet Commonly known as:  ELIQUIS Take 2 tablets (10 mg total) by mouth 2 (two) times daily. Take 2 tablets twice daily for 7 days then 1 tablet twice daily. First dose Sat 03/07/2017 What changed:    how much to take  additional instructions   benzonatate 100 MG capsule Commonly known as:  TESSALON PERLES Take 1 capsule (100 mg total) by mouth 3 (three) times daily as needed for cough.   betamethasone dipropionate 0.05 % ointment Commonly known as:  DIPROLENE Apply 1 application topically 2 (two) times daily as needed (to affected areas).   fluticasone 50 MCG/ACT nasal spray Commonly known as:  FLONASE Place 2 sprays into both nostrils at bedtime as needed for allergies or rhinitis.   guaiFENesin 100 MG/5ML Soln Commonly known as:  ROBITUSSIN Take 5 mLs (100 mg total) by mouth every 4 (four) hours as needed for cough or to loosen phlegm.   hydrochlorothiazide 12.5 MG capsule Commonly known as:  MICROZIDE Take 12.5 mg by mouth daily as needed for edema or fluid.   HYDROcodone-acetaminophen 5-325 MG tablet Commonly known as:  NORCO/VICODIN Take 1 tablet by mouth every 8 (eight) hours as needed for pain.    levothyroxine 75 MCG tablet Commonly known as:  SYNTHROID, LEVOTHROID Take 75 mcg by mouth every Tuesday, Thursday, Saturday, and Sunday.   levothyroxine 50 MCG tablet Commonly known as:  SYNTHROID, LEVOTHROID Take 50 mcg by mouth every Monday, Wednesday, and Friday.   meclizine 25 MG tablet Commonly known as:  ANTIVERT Take 25 mg by mouth 3 (three) times daily as needed for dizziness.   methocarbamol 750 MG tablet Commonly known as:  ROBAXIN Take 750 mg by mouth 3 (three) times daily as needed for muscle spasms.   metoprolol succinate 25 MG 24 hr tablet Commonly known as:  TOPROL-XL Take 25 mg by mouth at bedtime.   omeprazole 20 MG capsule Commonly known as:  PRILOSEC Take 20 mg by mouth daily as needed (for reflux).   OVER THE COUNTER MEDICATION Remefemin tablets: Take 1 tablet by mouth two to three times a week   sulfamethoxazole-trimethoprim 800-160 MG tablet Commonly known as:  BACTRIM DS,SEPTRA DS Take 1 tablet by mouth 2 (two) times daily as needed (as directed for UTI(s)).   UNABLE TO FIND Compression stockings, daily       Allergies  Allergen Reactions  . Nsaids Other (See Comments)    Not suppose to take with her blood thinner  . Other Other (See Comments)    Extreme stomach pain if any of these are eaten: spicy foods, onions, fried foods, cauliflower, and broccoli  .  Prednisone Other (See Comments)    Not suppose to take due to advanced osteoporosis  . Risedronate Other (See Comments)    (Actonel) Caused EXTREME JAW PAIN and muscles and joints ached   . Adhesive [Tape] Rash and Other (See Comments)    EKG pads left welts on the skin  . Codeine Palpitations and Other (See Comments)    Tachycardia   . Penicillins Rash    Has patient had a PCN reaction causing immediate rash, facial/tongue/throat swelling, SOB or lightheadedness with hypotension: Yes Has patient had a PCN reaction causing severe rash involving mucus membranes or skin necrosis:  Unk Has patient had a PCN reaction that required hospitalization: Unk Has patient had a PCN reaction occurring within the last 10 years: Unk If all of the above answers are "NO", then may proceed with Cephalosporin use.     Consultations:     Procedures/Studies: Dg Chest 2 View  Result Date: 03/18/2017 CLINICAL DATA:  Blood clots in the left leg and left arm last year. EXAM: CHEST  2 VIEW COMPARISON:  CT chest 02/26/2017 FINDINGS: The heart size and mediastinal contours are within normal limits. Both lungs are clear. The visualized skeletal structures are unremarkable. IMPRESSION: No active cardiopulmonary disease. Electronically Signed   By: Elige Ko   On: 03/18/2017 17:46   Ct Angio Chest Pe W And/or Wo Contrast  Result Date: 03/19/2017 CLINICAL DATA:  Left upper extremity pain for 2 days. History of DVT and PE. EXAM: CT ANGIOGRAPHY CHEST WITH CONTRAST TECHNIQUE: Multidetector CT imaging of the chest was performed using the standard protocol during bolus administration of intravenous contrast. Multiplanar CT image reconstructions and MIPs were obtained to evaluate the vascular anatomy. CONTRAST:  80 cc ISOVUE-370 IOPAMIDOL (ISOVUE-370) INJECTION 76% COMPARISON:  Chest radiograph from earlier today. 02/26/2017 chest CT angiogram. FINDINGS: Cardiovascular: The study is low quality for the evaluation of pulmonary embolism, significantly limited by motion degradation and suboptimal contrast opacification of the pulmonary arteries, which largely precludes evaluation of the segmental and subsegmental pulmonary arteries. No evidence of acute central pulmonary emboli. Recently identified bilateral lobar, segmental and subsegmental pulmonary emboli appears substantially decreased, with small residual subacute filling defects in the segmental and subsegmental pulmonary artery branches of the left upper lobe, left lower lobe and right middle lobe. Atherosclerotic nonaneurysmal thoracic aorta. Normal  caliber pulmonary arteries (main pulmonary artery diameter 2.6 cm). Normal heart size. No significant pericardial fluid/thickening. Mediastinum/Nodes: No discrete thyroid nodules. Unremarkable esophagus. No pathologically enlarged axillary, mediastinal or hilar lymph nodes. Lungs/Pleura: No pneumothorax. No pleural effusion. No acute consolidative airspace disease, lung masses or significant pulmonary nodules. Subsegmental scarring versus atelectasis in lower lobes. Upper abdomen: Small hiatal hernia.  Cholecystectomy. Musculoskeletal: No aggressive appearing focal osseous lesions. Stable vertebroplasty changes in the T11 and T12 mild vertebral compression fractures. Mild thoracic spondylosis. Review of the MIP images confirms the above findings. IMPRESSION: 1. Very limited motion degraded scan. No acute central pulmonary embolism. Recently identified bilateral pulmonary emboli are substantially decreased with small residual subacute emboli in the segmental and subsegmental bilateral pulmonary artery branches as detailed. 2. Subsegmental scarring versus atelectasis in the lower lobes. 3. Small hiatal hernia. Aortic Atherosclerosis (ICD10-I70.0). Electronically Signed   By: Delbert Phenix M.D.   On: 03/19/2017 00:16   Ct Angio Chest Pe W And/or Wo Contrast  Result Date: 02/26/2017 CLINICAL DATA:  Shortness of Breath EXAM: CT ANGIOGRAPHY CHEST WITH CONTRAST TECHNIQUE: Multidetector CT imaging of the chest was performed using the standard protocol  during bolus administration of intravenous contrast. Multiplanar CT image reconstructions and MIPs were obtained to evaluate the vascular anatomy. CONTRAST:  56 mL ISOVUE-370 IOPAMIDOL (ISOVUE-370) INJECTION 76% COMPARISON:  Chest radiograph November 06, 2015 and chest CT November 29, 2015 FINDINGS: Cardiovascular: There is pulmonary embolus arising from each lateral main pulmonary artery with extension into multiple upper and lower lobe pulmonary arterial branches. The  right ventricle to left ventricle diameter ratio is 1.2, a finding indicative of a degree of right heart strain. There is no appreciable thoracic aortic aneurysm or dissection. Visualized great vessels appear normal. There are foci of aortic atherosclerosis. There is no appreciable pericardial effusion or pericardial thickening. Mediastinum/Nodes: Thyroid appears normal. There are subcentimeter mediastinal lymph nodes. There is no adenopathy by size criteria. There is a small hiatal hernia. Lungs/Pleura: No edema or consolidation. There is a persistent somewhat mosaic pattern of lung attenuation consistent with small airways obstructive disease as well as patchy atelectasis. No pleural effusion or pleural thickening evident. Upper Abdomen: Gallbladder is absent. Visualized upper abdominal structures otherwise appear unremarkable. Musculoskeletal: Patient has had kyphoplasty procedures in the upper lumbar region. There is degenerative change with vacuum phenomenon at several levels in the lower thoracic and upper lumbar regions. There are no blastic or lytic bone lesions. Review of the MIP images confirms the above findings. IMPRESSION: 1. Pulmonary emboli bilaterally arising from each distal main pulmonary artery extending into multiple upper lobe and lower lobe pulmonary arterial branches bilaterally. Positive for acute PE with CT evidence of right heart strain (RV/LV Ratio = 1.2) consistent with at least submassive (intermediate risk) PE. The presence of right heart strain has been associated with an increased risk of morbidity and mortality. Please activate Code PE by paging [phone]. 2. Somewhat mosaic pattern of attenuation in the lung parenchyma. Suspect a combination of atelectasis and small airways obstructive disease. No edema or consolidation. 3.  No evident adenopathy. 4.  Mild aortic atherosclerosis. 5.  Gallbladder absent. Aortic Atherosclerosis (ICD10-I70.0). Critical Value/emergent results were  called by telephone at the time of interpretation on 02/26/2017 at 7:16 pm to Dr. Alona Bene , who verbally acknowledged these results. Electronically Signed   By: Bretta Bang III M.D.   On: 02/26/2017 19:17       Subjective: Patient with improved chest pain, no dyspnea or palpitations,   Discharge Exam: Vitals:   03/19/17 1200 03/19/17 1237  BP:  (!) 124/50  Pulse: 74 74  Resp: 15 17  Temp:  97.8 F (36.6 C)  SpO2: 94% 96%   Vitals:   03/19/17 1000 03/19/17 1100 03/19/17 1200 03/19/17 1237  BP:    (!) 124/50  Pulse: 84 77 74 74  Resp: 15 18 15 17   Temp:    97.8 F (36.6 C)  TempSrc:    Oral  SpO2: 95% 97% 94% 96%  Weight:      Height:        General: Pt is alert, awake, not in acute distress E ENT. No pallor or icterus.  Cardiovascular: RRR, S1/S2 +, no rubs, no gallops Respiratory: CTA bilaterally, no wheezing, no rhonchi Abdominal: Soft, NT, ND, bowel sounds +/ protuberant abdomen.  Extremities: non pitting edema +, no cyanosis    The results of significant diagnostics from this hospitalization (including imaging, microbiology, ancillary and laboratory) are listed below for reference.     Microbiology: Recent Results (from the past 240 hour(s))  Fecal occult blood, imunochemical     Status: None   Collection Time:  03/12/17 12:00 AM  Result Value Ref Range Status   Fecal Occult Bld Negative Negative Final  MRSA PCR Screening     Status: None   Collection Time: 03/19/17  3:00 AM  Result Value Ref Range Status   MRSA by PCR NEGATIVE NEGATIVE Final    Comment:        The GeneXpert MRSA Assay (FDA approved for NASAL specimens only), is one component of a comprehensive MRSA colonization surveillance program. It is not intended to diagnose MRSA infection nor to guide or monitor treatment for MRSA infections.      Labs: BNP (last 3 results) Recent Labs    02/26/17 2337  BNP 13.9   Basic Metabolic Panel: Recent Labs  Lab 03/18/17 1103  03/18/17 1710 03/19/17 0639  NA 139 139 140  K 4.1 3.8 3.8  CL 105 106 108  CO2 21 24 22   GLUCOSE 93 93 88  BUN 10 10 11   CREATININE 0.73 0.76 0.77  CALCIUM 9.3 9.4 8.9   Liver Function Tests: No results for input(s): AST, ALT, ALKPHOS, BILITOT, PROT, ALBUMIN in the last 168 hours. No results for input(s): LIPASE, AMYLASE in the last 168 hours. No results for input(s): AMMONIA in the last 168 hours. CBC: Recent Labs  Lab 03/18/17 1710 03/19/17 0639  WBC 7.3 7.0  HGB 13.9 13.6  HCT 42.4 40.5  MCV 89.3 89.2  PLT 236 209   Cardiac Enzymes: Recent Labs  Lab 03/19/17 0015 03/19/17 0639 03/19/17 1119  TROPONINI <0.03 <0.03 <0.03   BNP: Invalid input(s): POCBNP CBG: Recent Labs  Lab 03/19/17 0752 03/19/17 1236  GLUCAP 85 81   D-Dimer No results for input(s): DDIMER in the last 72 hours. Hgb A1c No results for input(s): HGBA1C in the last 72 hours. Lipid Profile No results for input(s): CHOL, HDL, LDLCALC, TRIG, CHOLHDL, LDLDIRECT in the last 72 hours. Thyroid function studies No results for input(s): TSH, T4TOTAL, T3FREE, THYROIDAB in the last 72 hours.  Invalid input(s): FREET3 Anemia work up No results for input(s): VITAMINB12, FOLATE, FERRITIN, TIBC, IRON, RETICCTPCT in the last 72 hours. Urinalysis No results found for: COLORURINE, APPEARANCEUR, LABSPEC, PHURINE, GLUCOSEU, HGBUR, BILIRUBINUR, KETONESUR, PROTEINUR, UROBILINOGEN, NITRITE, LEUKOCYTESUR Sepsis Labs Invalid input(s): PROCALCITONIN,  WBC,  LACTICIDVEN Microbiology Recent Results (from the past 240 hour(s))  Fecal occult blood, imunochemical     Status: None   Collection Time: 03/12/17 12:00 AM  Result Value Ref Range Status   Fecal Occult Bld Negative Negative Final  MRSA PCR Screening     Status: None   Collection Time: 03/19/17  3:00 AM  Result Value Ref Range Status   MRSA by PCR NEGATIVE NEGATIVE Final    Comment:        The GeneXpert MRSA Assay (FDA approved for NASAL  specimens only), is one component of a comprehensive MRSA colonization surveillance program. It is not intended to diagnose MRSA infection nor to guide or monitor treatment for MRSA infections.      Time coordinating discharge: 45 minutes  SIGNED:   Coralie Keens, MD  [facility] 03/19/2017, 1:31 PM Pager [phone]  If 7PM-7AM, please contact night-coverage [email or web address] Password [email or web address]

## 2017-03-19 NOTE — ED Notes (Signed)
Pt given graham crackers, peanut butter, and sprite per request

## 2017-03-24 ENCOUNTER — Telehealth: Payer: Self-pay | Admitting: Cardiology

## 2017-03-24 NOTE — Telephone Encounter (Signed)
Patient taking Eloquois, bleeding from rectum

## 2017-03-24 NOTE — Telephone Encounter (Signed)
Spoke with pt to make her aware that Dr Kirtland BouchardK would like her to monitor her bleeding.  He does not believe the bleeding issues she is having is coming from Eliquis.  If she continues to have issues, she will contact our office.  Pt verbalized understanding.

## 2017-03-24 NOTE — Telephone Encounter (Signed)
Pt states that she was recently started on Eliquis on 02-28-17.  Today she has had an upset stomach, and has been the bathroon 3 times today.  There was no blood in the commode, only on the toilet tissue.  She has not had any other bleeding.

## 2017-03-26 DIAGNOSIS — M25512 Pain in left shoulder: Secondary | ICD-10-CM | POA: Diagnosis not present

## 2017-03-26 DIAGNOSIS — Z09 Encounter for follow-up examination after completed treatment for conditions other than malignant neoplasm: Secondary | ICD-10-CM | POA: Diagnosis not present

## 2017-03-26 DIAGNOSIS — R05 Cough: Secondary | ICD-10-CM | POA: Diagnosis not present

## 2017-03-26 DIAGNOSIS — J449 Chronic obstructive pulmonary disease, unspecified: Secondary | ICD-10-CM | POA: Diagnosis not present

## 2017-03-31 ENCOUNTER — Ambulatory Visit (INDEPENDENT_AMBULATORY_CARE_PROVIDER_SITE_OTHER): Payer: PPO | Admitting: Cardiology

## 2017-03-31 ENCOUNTER — Encounter: Payer: Self-pay | Admitting: Cardiology

## 2017-03-31 VITALS — BP 130/74 | HR 85 | Ht 66.0 in | Wt 243.0 lb

## 2017-03-31 DIAGNOSIS — R002 Palpitations: Secondary | ICD-10-CM | POA: Diagnosis not present

## 2017-03-31 DIAGNOSIS — R0600 Dyspnea, unspecified: Secondary | ICD-10-CM

## 2017-03-31 DIAGNOSIS — R0789 Other chest pain: Secondary | ICD-10-CM | POA: Diagnosis not present

## 2017-03-31 DIAGNOSIS — R0689 Other abnormalities of breathing: Secondary | ICD-10-CM | POA: Diagnosis not present

## 2017-03-31 DIAGNOSIS — I2782 Chronic pulmonary embolism: Secondary | ICD-10-CM

## 2017-03-31 NOTE — Progress Notes (Signed)
Cardiology Office Note:    Date:  03/31/2017   ID:  Gail Olson, DOB [birth year], MRN [id number]  PCP:  System, Pcp Not In  Cardiologist:  Gypsy Balsam, MD    Referring MD: No ref. provider found   Chief Complaint  Patient presents with  . Follow-up  Still coughing a lot but less chest pain  History of Present Illness:    Gail Olson is a 67 y.o. female who was recently diagnosed with acute pulmonary emboli and DVT.  Treated appropriately however ended up going back to the emergency room couple days ago because of persistent cough and some chest pain.  Repeated CT of the chest was done which showed significant improvement in volume of pulmonary emboli.  She maintains anticoagulation.  She was given some cough medication which helps a lot.  Still weak tired exhausted short of breath with exertion but overall gradually improving.  Past Medical History:  Diagnosis Date  . Arthritis   . DVT (deep venous thrombosis) (HCC)   . Pulmonary embolism (HCC)   . Thyroid disease     Past Surgical History:  Procedure Laterality Date  . APPENDECTOMY    . BACK SURGERY    . CHOLECYSTECTOMY      Current Medications: Current Meds  Medication Sig  . acetaminophen (TYLENOL) 650 MG CR tablet Take 650 mg by mouth every 8 (eight) hours as needed for pain.  Marland Kitchen albuterol (PROAIR HFA) 108 (90 Base) MCG/ACT inhaler Inhale 2 puffs into the lungs See admin instructions. 2 puffs every four to six hours as needed for shortness of breath  . albuterol (PROVENTIL) (2.5 MG/3ML) 0.083% nebulizer solution Take 2.5 mg by nebulization every 4 (four) hours as needed for wheezing or shortness of breath (AS DIRECTED BY MD).   Marland Kitchen apixaban (ELIQUIS) 5 MG TABS tablet Take 2 tablets (10 mg total) by mouth 2 (two) times daily. Take 2 tablets twice daily for 7 days then 1 tablet twice daily. First dose Sat 03/07/2017 (Patient taking differently: Take 5 mg by mouth 2 (two) times daily. )  . benzonatate (TESSALON  PERLES) 100 MG capsule Take 1 capsule (100 mg total) by mouth 3 (three) times daily as needed for cough.  . betamethasone dipropionate (DIPROLENE) 0.05 % ointment Apply 1 application topically 2 (two) times daily as needed (to affected areas).   . clarithromycin (BIAXIN) 500 MG tablet Take by mouth.  . fluticasone (FLONASE) 50 MCG/ACT nasal spray Place 2 sprays into both nostrils at bedtime as needed for allergies or rhinitis.   Marland Kitchen guaiFENesin (ROBITUSSIN) 100 MG/5ML SOLN Take 5 mLs (100 mg total) by mouth every 4 (four) hours as needed for cough or to loosen phlegm.  . hydrochlorothiazide (MICROZIDE) 12.5 MG capsule Take 12.5 mg by mouth daily as needed for edema or fluid.  Marland Kitchen HYDROcodone-acetaminophen (NORCO/VICODIN) 5-325 MG tablet Take 1 tablet by mouth every 8 (eight) hours as needed for pain.  Marland Kitchen HYDROcodone-homatropine (HYCODAN) 5-1.5 MG/5ML syrup TAKE 5 MLS BY MOUTH EVERY 4 (FOUR) HOURS AS NEEDED FOR UP TO 10 DAYS (COUGH).  Marland Kitchen levothyroxine (SYNTHROID, LEVOTHROID) 50 MCG tablet Take 50 mcg by mouth every Monday, Wednesday, and Friday.  . levothyroxine (SYNTHROID, LEVOTHROID) 75 MCG tablet Take 75 mcg by mouth every Tuesday, Thursday, Saturday, and Sunday.  . meclizine (ANTIVERT) 25 MG tablet Take 25 mg by mouth 3 (three) times daily as needed for dizziness.   . methocarbamol (ROBAXIN) 750 MG tablet Take 750 mg by mouth 3 (three) times  daily as needed for muscle spasms.   . metoprolol succinate (TOPROL-XL) 25 MG 24 hr tablet Take 25 mg by mouth at bedtime.   Marland Kitchen. omeprazole (PRILOSEC) 20 MG capsule Take 20 mg by mouth daily as needed (for reflux).   Marland Kitchen. OVER THE COUNTER MEDICATION Remefemin tablets: Take 1 tablet by mouth two to three times a week  . sulfamethoxazole-trimethoprim (BACTRIM DS,SEPTRA DS) 800-160 MG tablet Take 1 tablet by mouth 2 (two) times daily as needed (as directed for UTI(s)).  Marland Kitchen. UNABLE TO FIND Compression stockings, daily     Allergies:   Nsaids; Other; Prednisone; Risedronate;  Adhesive [tape]; Codeine; and Penicillins   Social History   Socioeconomic History  . Marital status: Married    Spouse name: None  . Number of children: None  . Years of education: None  . Highest education level: None  Social Needs  . Financial resource strain: None  . Food insecurity - worry: None  . Food insecurity - inability: None  . Transportation needs - medical: None  . Transportation needs - non-medical: None  Occupational History  . None  Tobacco Use  . Smoking status: Never Smoker  . Smokeless tobacco: Never Used  Substance and Sexual Activity  . Alcohol use: No    Frequency: Never  . Drug use: No  . Sexual activity: None  Other Topics Concern  . None  Social History Narrative  . None     Family History: The patient's family history includes Cancer in her mother; Hypertension in her brother, mother, and sister. There is no history of Pulmonary disease. ROS:   Please see the history of present illness.    All 14 point review of systems negative except as described per history of present illness  EKGs/Labs/Other Studies Reviewed:      Recent Labs: 02/26/2017: B Natriuretic Peptide 13.9 03/19/2017: BUN 11; Creatinine, Ser 0.77; Hemoglobin 13.6; Platelets 209; Potassium 3.8; Sodium 140  Recent Lipid Panel No results found for: CHOL, TRIG, HDL, CHOLHDL, VLDL, LDLCALC, LDLDIRECT  Physical Exam:    VS:  BP 130/74 (BP Location: Left Arm, Patient Position: Sitting, Cuff Size: Large)   Pulse 85   Ht 5\' 6"  (1.676 m)   Wt 243 lb (110.2 kg)   SpO2 98%   BMI 39.22 kg/m     Wt Readings from Last 3 Encounters:  03/31/17 243 lb (110.2 kg)  03/19/17 240 lb 1.3 oz (108.9 kg)  03/10/17 235 lb (106.6 kg)     GEN:  Well nourished, well developed in no acute distress HEENT: Normal NECK: No JVD; No carotid bruits LYMPHATICS: No lymphadenopathy CARDIAC: RRR, no murmurs, no rubs, no gallops RESPIRATORY:  Clear to auscultation without rales, wheezing or rhonchi    ABDOMEN: Soft, non-tender, non-distended MUSCULOSKELETAL:  No edema; No deformity  SKIN: Warm and dry LOWER EXTREMITIES: no swelling NEUROLOGIC:  Alert and oriented x 3 PSYCHIATRIC:  Normal affect   ASSESSMENT:    1. Other chronic pulmonary embolism without acute cor pulmonale (HCC)   2. Atypical chest pain   3. Dyspnea and respiratory abnormalities   4. Palpitations    PLAN:    In order of problems listed above:  1. Pulmonary emboli: Anticoagulated which I will continue.  CAT scan shows significant improvement involving of pulmonary emboli no evidence of right side heart failure. 2. Pain which is pleuritic now improved significantly the biggest complaint right now is cough she is being treated for it by primary care physician with antibiotic and  cough medications 3. Dyspnea on exertion still present but improved. 4. Palpitations: Denies having any  Overall she is improving from pulmonary emboli point of view.  I will schedule her to see me back in about 1 month or sooner if she had a problem at that time we will repeat echocardiogram to recheck pulmonary pressure   Medication Adjustments/Labs and Tests Ordered: Current medicines are reviewed at length with the patient today.  Concerns regarding medicines are outlined above.  No orders of the defined types were placed in this encounter.  Medication changes: No orders of the defined types were placed in this encounter.   Signed, Georgeanna Lea, MD, [facility] 03/31/2017 11:20 AM    [facility]

## 2017-03-31 NOTE — Patient Instructions (Signed)
Medication Instructions:  Your physician recommends that you continue on your current medications as directed. Please refer to the Current Medication list given to you today.  Labwork: None ordered  Testing/Procedures: None ordered  Follow-Up: Your physician recommends that you schedule a follow-up appointment in: 1 month follow up with Dr. Krasowski   Any Other Special Instructions Will Be Listed Below (If Applicable).     If you need a refill on your cardiac medications before your next appointment, please call your pharmacy.   

## 2017-04-20 ENCOUNTER — Telehealth: Payer: Self-pay | Admitting: Pulmonary Disease

## 2017-04-20 ENCOUNTER — Telehealth: Payer: Self-pay | Admitting: Cardiology

## 2017-04-20 NOTE — Telephone Encounter (Signed)
Reviewed with Dr. Bing MatterKrasowski, wants patient to be seen by pulmonology. Reached out to pulmonology regarding referral put in 03/10/17. Advised patient we are waiting to hear back and will contact her tomorrow. Patient verbalized understanding. No further questions.

## 2017-04-20 NOTE — Telephone Encounter (Signed)
Spoke with Clydie BraunKaren. She stated Dr. Bing MatterKrasowski is asking that the patient be seen this Thursday by VS if possible. VS' schedule is completed booked. Dr. Kirtland BouchardK would like for the patient to be seen for history of pulmonary embolism. Per chart, a referral was placed to pulmonary back in January 2019 but was never followed up on. Patient has since been in and out of the hospital for the same reason.   VS, please advise if there is anyway possible we can add this patient to your schedule on Thursday 2/21. Thanks!

## 2017-04-20 NOTE — Telephone Encounter (Signed)
Has a bad cough and it's gotten worse, doesn't know what to take

## 2017-04-21 ENCOUNTER — Other Ambulatory Visit: Payer: Self-pay | Admitting: Cardiology

## 2017-04-21 MED ORDER — APIXABAN 5 MG PO TABS: 5.0000 mg | ORAL_TABLET | Freq: Two times a day (BID) | ORAL | 3 refills | Status: DC

## 2017-04-21 NOTE — Telephone Encounter (Signed)
Okay to add patient.

## 2017-04-21 NOTE — Telephone Encounter (Signed)
Refill sent.

## 2017-04-21 NOTE — Telephone Encounter (Signed)
Confirmed with patient appointment with pulmonology scheduled tomorrow 04/22/17 at 10:30 am. Patient verbalized understanding, no further questions.

## 2017-04-21 NOTE — Telephone Encounter (Signed)
Spoke with pt, made an appt with VS tomorrow at 10:30. Nothing further is needed.

## 2017-04-21 NOTE — Telephone Encounter (Signed)
°*  STAT* If patient is at the pharmacy, call can be transferred to refill team.   1. Which medications need to be refilled? (please list name of each medication and dose if known) Eliquis 5mg   1 twice daily   2. Which pharmacy/location (including street and city if local pharmacy) is medication to be sent to? [facility]  3. Do they need a 30 day or 90 day supply? 90

## 2017-04-22 ENCOUNTER — Ambulatory Visit: Payer: PPO | Admitting: Pulmonary Disease

## 2017-04-22 ENCOUNTER — Institutional Professional Consult (permissible substitution): Payer: PPO | Admitting: Pulmonary Disease

## 2017-04-22 ENCOUNTER — Encounter: Payer: Self-pay | Admitting: Pulmonary Disease

## 2017-04-22 VITALS — BP 112/80 | HR 81 | Ht 66.0 in | Wt 241.0 lb

## 2017-04-22 DIAGNOSIS — J42 Unspecified chronic bronchitis: Secondary | ICD-10-CM | POA: Diagnosis not present

## 2017-04-22 MED ORDER — PREDNISONE 10 MG PO TABS: ORAL_TABLET | ORAL | 0 refills | Status: DC

## 2017-04-22 MED ORDER — LEVALBUTEROL HCL 0.63 MG/3ML IN NEBU
0.6300 mg | INHALATION_SOLUTION | Freq: Once | RESPIRATORY_TRACT | Status: AC
  Administered 2017-04-22: 0.63 mg via RESPIRATORY_TRACT

## 2017-04-22 MED ORDER — FLUTICASONE PROPIONATE HFA 110 MCG/ACT IN AERO: 2.0000 | INHALATION_SPRAY | Freq: Two times a day (BID) | RESPIRATORY_TRACT | 12 refills | Status: DC

## 2017-04-22 MED ORDER — MONTELUKAST SODIUM 10 MG PO TABS: 10.0000 mg | ORAL_TABLET | Freq: Every day | ORAL | 5 refills | Status: DC

## 2017-04-22 NOTE — Progress Notes (Signed)
[facility]  Chief Complaint  Patient presents with  . pulm consult    Pt referred by cardiologist Dr. Bing MatterKrasowski MD. Pt having severe chest pain, chest tightness in last 8 days. Pt had multiple blood clots in lungs when in hospital Dec 2018. Pt is having SOB at rest and w/exertion, productive cough-white thick mucus. Pt has swelling in legs and hands, wearing compression socks for leg swelling. Pt having hard time breathing in last 8 days. Pt is choking when coughing, pt is wheezing, and some rattle sounds.    Vital signs: BP 112/80 (BP Location: Left Arm, Cuff Size: Normal)   Pulse 81   Ht 5\' 6"  (1.676 m)   Wt 241 lb (109.3 kg)   SpO2 98%   BMI 38.90 kg/m   History of Present Illness: Gail Olson is a 67 y.o. female with dyspnea.  She has noticed trouble with her breathing and cough for several years.  She has multiple allergies.  She was tried on allergy shots before, but this made her symptoms worse.  She gets cough, wheeze, and chest congestion.  She can bring up yellow sputum.  She has been tried on prednisone and albuterol, and both of these seem to help.  She can get into long coughing spells, and then feel like she is going to pass out.  She also gets sinus congestion.  She has been using claritin and flonase.  She was found to have a Lt leg DVT and acute PE in December 2018.  She was started on eliquis.  It doesn't appear that there was any inciting event identified for her thromboembolic event.  She had follow up CT chest in January 2019 that showed significant improvement in clot burden.  Of note is that on multiple previous CT chests she has been noted to have atelectasis and mosaic pattern with suggestion of small airway disease.    [place] but has lived in [place]  most of her adult life.  [place].  She had several family members who smoked, but she never smoked.  She is disabled due to  osteoporosis.  No animal or bird exposures.  She had pneumonia several years ago.  No history of exposure to tuberculosis.  She has follow up with Dr. Bing MatterKrasowski later this month, and thinks she has follow up Echo scheduled then.  Physical Exam:  General - frequent coughing spells Eyes - pupils reactive ENT - no sinus tenderness, no oral exudate, no LAN Cardiac - regular, no murmur Chest - faint rhonchi at bases Abd - soft, non tender Ext - no edema, wearing compression stockings Skin - no rashes Neuro - normal strength Psych - normal mood  Discussion: She has persistent cough, wheeze, and dyspnea.  Her symptoms predate her episode of DVT and PE.  She has findings of small airway disease on previous CT chest.  I think she might have allergic asthma as the cause of her current symptoms rather than sequela from recent PE.  She might also have sleep disordered breathing.  Assessment/Plan:  Chronic bronchitis with possible allergic asthma. - will give course of prednisone - flovent two puffs bid - albuterol prn - singulair 10 mg qhs - she is to continue flonase and claritin - depending on her symptom status at follow, she will then need to be set up for PFTs - might need further allergy testing at some point with RAST and IgE,  and CBC with differential  Suspected sleep disordered breathing. - will arrange for overnight oximetry on room air  Recent unprovoked pulmonary embolism with Lt leg DVT. - she is to continue on eliquis, and likely will need this indefinitely - she has f/u with cardiology later this month, and can arrange for f/u Echocardiogram then   Patient Instructions  Prednisone 10 mg pill >> 3 pills daily for 2 days, 2 pills daily for 2 days, 1 pill daily for 2 days Flovent 2 puffs twice per day, and rinse mouth after each use Albuterol every 4 to 6 hours as needed for cough, wheeze, or chest congestion Singulair 10 mg pill nightly Continue using flonase and  claritin Will arrange for overnight oxygen test  Follow up in 2 weeks with Dr. Craige Cotta or Nurse Practitioner   Coralyn Helling, MD [facility] 04/22/2017, 11:42 AM Pager:  [phone]  Flow Sheet  Pulmonary tests: CT angio chest 02/26/17 >> b/l PE with RV:LV ratio 1.2, ATX, small airway disease CT angio chest 03/19/17 >> decreased size of PE, ATX, small hiatal hernia  Cardiac tests: Echo 02/27/17 >> EF 60 to 65%, grade 1 DD, PAS 47 mmHg  Review of Systems: Constitutional: Negative for fever and unexpected weight change.  HENT: Positive for congestion, ear pain and sore throat. Negative for dental problem, nosebleeds, postnasal drip, rhinorrhea, sinus pressure, sneezing and trouble swallowing.   Eyes: Negative for redness and itching.  Respiratory: Positive for cough, choking, chest tightness, shortness of breath, wheezing and stridor.   Cardiovascular: Positive for palpitations and leg swelling.  Gastrointestinal: Negative for nausea and vomiting.  Genitourinary: Negative for dysuria.  Musculoskeletal: Positive for joint swelling.  Skin: Negative for rash.  Allergic/Immunologic: Positive for environmental allergies and food allergies. Negative for immunocompromised state.  Neurological: Positive for dizziness, light-headedness, numbness and headaches.  Hematological: Does not bruise/bleed easily.  Psychiatric/Behavioral: Negative for dysphoric mood. The patient is nervous/anxious.    Past Medical History: She  has a past medical history of Arthritis, DVT (deep venous thrombosis) (HCC), Pulmonary embolism (HCC), and Thyroid disease.  Past Surgical History: She  has a past surgical history that includes Appendectomy; Back surgery; and Cholecystectomy.  Family History: Her family history includes Cancer in her mother; Hypertension in her brother, mother, and sister.  Social History: She  reports that  has never smoked. she has never used smokeless tobacco. She  reports that she does not drink alcohol or use drugs.  Medications: Allergies as of 04/22/2017      Reactions   Nsaids Other (See Comments)   Not suppose to take with her blood thinner   Other Other (See Comments)   Extreme stomach pain if any of these are eaten: spicy foods, onions, fried foods, cauliflower, and broccoli   Prednisone Other (See Comments)   Not suppose to take due to advanced osteoporosis   Risedronate Other (See Comments)   (Actonel) Caused EXTREME JAW PAIN and muscles and joints ached   Adhesive [tape] Rash, Other (See Comments)   EKG pads left welts on the skin   Codeine Palpitations, Other (See Comments)   Tachycardia   Penicillins Rash   Has patient had a PCN reaction causing immediate rash, facial/tongue/throat swelling, SOB or lightheadedness with hypotension: Yes Has patient had a PCN reaction causing severe rash involving mucus membranes or skin necrosis: Unk Has patient had a PCN reaction that required hospitalization: Unk Has patient had a PCN reaction occurring within the last 10 years: Unk If all  of the above answers are "NO", then may proceed with Cephalosporin use.      Medication List        Accurate as of 04/22/17 11:42 AM. Always use your most recent med list.          acetaminophen 650 MG CR tablet Commonly known as:  TYLENOL Take 650 mg by mouth every 8 (eight) hours as needed for pain.   PROAIR HFA 108 (90 Base) MCG/ACT inhaler Generic drug:  albuterol Inhale 2 puffs into the lungs See admin instructions. 2 puffs every four to six hours as needed for shortness of breath   albuterol (2.5 MG/3ML) 0.083% nebulizer solution Commonly known as:  PROVENTIL Take 2.5 mg by nebulization every 4 (four) hours as needed for wheezing or shortness of breath (AS DIRECTED BY MD).   apixaban 5 MG Tabs tablet Commonly known as:  ELIQUIS Take 1 tablet (5 mg total) by mouth 2 (two) times daily.   benzonatate 100 MG capsule Commonly known as:  TESSALON  PERLES Take 1 capsule (100 mg total) by mouth 3 (three) times daily as needed for cough.   betamethasone dipropionate 0.05 % ointment Commonly known as:  DIPROLENE Apply 1 application topically 2 (two) times daily as needed (to affected areas).   fluticasone 110 MCG/ACT inhaler Commonly known as:  FLOVENT HFA Inhale 2 puffs into the lungs 2 (two) times daily.   fluticasone 50 MCG/ACT nasal spray Commonly known as:  FLONASE Place 2 sprays into both nostrils at bedtime as needed for allergies or rhinitis.   guaiFENesin 100 MG/5ML Soln Commonly known as:  ROBITUSSIN Take 5 mLs (100 mg total) by mouth every 4 (four) hours as needed for cough or to loosen phlegm.   hydrochlorothiazide 12.5 MG capsule Commonly known as:  MICROZIDE Take 12.5 mg by mouth daily as needed for edema or fluid.   HYDROcodone-acetaminophen 5-325 MG tablet Commonly known as:  NORCO/VICODIN Take 1 tablet by mouth every 8 (eight) hours as needed for pain.   HYDROcodone-homatropine 5-1.5 MG/5ML syrup Commonly known as:  HYCODAN TAKE 5 MLS BY MOUTH EVERY 4 (FOUR) HOURS AS NEEDED FOR UP TO 10 DAYS (COUGH).   levothyroxine 75 MCG tablet Commonly known as:  SYNTHROID, LEVOTHROID Take 75 mcg by mouth every Tuesday, Thursday, Saturday, and Sunday.   levothyroxine 50 MCG tablet Commonly known as:  SYNTHROID, LEVOTHROID Take 50 mcg by mouth every Monday, Wednesday, and Friday.   meclizine 25 MG tablet Commonly known as:  ANTIVERT Take 25 mg by mouth 3 (three) times daily as needed for dizziness.   methocarbamol 750 MG tablet Commonly known as:  ROBAXIN Take 750 mg by mouth 3 (three) times daily as needed for muscle spasms.   metoprolol succinate 25 MG 24 hr tablet Commonly known as:  TOPROL-XL Take 25 mg by mouth at bedtime.   montelukast 10 MG tablet Commonly known as:  SINGULAIR Take 1 tablet (10 mg total) by mouth at bedtime.   omeprazole 20 MG capsule Commonly known as:  PRILOSEC Take 20 mg by  mouth daily as needed (for reflux).   OVER THE COUNTER MEDICATION Remefemin tablets: Take 1 tablet by mouth two to three times a week   predniSONE 10 MG tablet Commonly known as:  DELTASONE 3 pills daily for 2 days, 2 pills daily for 2 days, 1 pill daily for 2 days   UNABLE TO FIND Compression stockings, daily

## 2017-04-22 NOTE — Patient Instructions (Signed)
Prednisone 10 mg pill >> 3 pills daily for 2 days, 2 pills daily for 2 days, 1 pill daily for 2 days Flovent 2 puffs twice per day, and rinse mouth after each use Albuterol every 4 to 6 hours as needed for cough, wheeze, or chest congestion Singulair 10 mg pill nightly Continue using flonase and claritin Will arrange for overnight oxygen test  Follow up in 2 weeks with Dr. Craige CottaSood or Nurse Practitioner

## 2017-04-22 NOTE — Progress Notes (Signed)
   Subjective:    Patient ID: Gail Olson, female    DOB: [birth year], 67 y.o.   MRN: [id number]  HPI    Review of Systems  Constitutional: Negative for fever and unexpected weight change.  HENT: Positive for congestion, ear pain and sore throat. Negative for dental problem, nosebleeds, postnasal drip, rhinorrhea, sinus pressure, sneezing and trouble swallowing.   Eyes: Negative for redness and itching.  Respiratory: Positive for cough, choking, chest tightness, shortness of breath, wheezing and stridor.   Cardiovascular: Positive for palpitations and leg swelling.  Gastrointestinal: Negative for nausea and vomiting.  Genitourinary: Negative for dysuria.  Musculoskeletal: Positive for joint swelling.  Skin: Negative for rash.  Allergic/Immunologic: Positive for environmental allergies and food allergies. Negative for immunocompromised state.  Neurological: Positive for dizziness, light-headedness, numbness and headaches.  Hematological: Does not bruise/bleed easily.  Psychiatric/Behavioral: Negative for dysphoric mood. The patient is nervous/anxious.        Objective:   Physical Exam        Assessment & Plan:

## 2017-04-27 ENCOUNTER — Encounter: Payer: Self-pay | Admitting: Pulmonary Disease

## 2017-04-30 ENCOUNTER — Ambulatory Visit: Payer: PPO | Admitting: Cardiology

## 2017-05-05 ENCOUNTER — Telehealth: Payer: Self-pay | Admitting: Pulmonary Disease

## 2017-05-05 NOTE — Progress Notes (Signed)
History of Present Illness Gail Olson is a 67 y.o. female never smoker with chronic bronchitis? Allergic asthma, ? Sleep disorder breathing, recent ( 01/2017) unprovoked PE, left  leg DVT. (On Eliquis) .She is followed by Dr. Craige CottaSood.  History of Present Illness: Gail Olson is a 67 y.o. female never smoker  with dyspnea. She was seen by Dr. Craige CottaSood 04/22/2017 for consult .   She has noticed trouble with her breathing and cough for several years.  She has multiple allergies.  She was tried on allergy shots before, but this made her symptoms worse.  She gets cough, wheeze, and chest congestion.  She can bring up yellow sputum.  She has been tried on prednisone and albuterol, and both of these seem to help.  She can get into long coughing spells, and then feel like she is going to pass out.  She also gets sinus congestion.  She has been using claritin and flonase.  She was found to have a Lt leg DVT and acute PE in December 2018.  She was started on eliquis.  It doesn't appear that there was any inciting event identified for her thromboembolic event.  She had follow up CT chest in January 2019 that showed significant improvement in clot burden. She is being anticoagulated with Eliquis. Of note: on multiple previous CT chests she has been noted to have atelectasis and mosaic pattern with suggestion of small airway disease.    [place] from [place], but has lived in [place] most of her adult life.  [place].  She had several family members who smoked, but she never smoked.  She is disabled due to osteoporosis.  No animal or bird exposures.  She had pneumonia several years ago.  No history of exposure to tuberculosis.  She has follow up with Dr. Bing MatterKrasowski later this month, and thinks she has follow up Echo scheduled then.  Discussion: She has persistent cough, wheeze, and dyspnea.  Her symptoms predate her episode of DVT and PE.  She has findings of small airway  disease on previous CT chest.  Dr. Curt JewsSood  Feels she might have allergic asthma as the cause of her current symptoms rather than sequela from recent PE.  She might also have sleep disordered breathing.   05/06/2017 Pt. Presents for follow up. She was seen by Dr. Craige CottaSood 04/22/17 for consult. Assessment and Plan of Care after that visit was as follows: Chronic bronchitis with possible allergic asthma. - will give course of prednisone - flovent two puffs bid - albuterol prn - singulair 10 mg qhs - she is to continue flonase and claritin - depending on her symptom status at follow up, she will then need to be set up for PFTs - might need further allergy testing at some point with RAST and IgE, and CBC with differential  Suspected sleep disordered breathing. - will arrange for overnight oximetry on room air  Recent unprovoked pulmonary embolism with Lt leg DVT. - she is to continue on eliquis, and likely will need this indefinitely - she has f/u with cardiology later this month, and can arrange for f/u Echocardiogram then  Pt presents today for follow up. She states she was complaint with Dr. Evlyn CourierSood's plan of care. She was complaint with prednisone, flovent, singulair, flonase and claritin. She has been complaint with her Eliquis. She states she is feeling much better today. We discussed additional allergy  Testing. She would like to defer for  now. She states she does have some new right leg pain, but she is on Eliquis and has been compliant with this medication.. She is wearing her compression hose today. She denies any long car trips or airline flights.She has no swelling in the leg. I offered a lower extremity doppler. I explained that it would be a small chance of DVT as she has been on blood thinner. She deferred. She has no obvious bleeding.Results of O&O indicate intermittent oxygen desaturations and pattern is concerning for sleep apnea. She will need a sleep study for diagnosis. Needs nocturnal  oxygen. We discussed the results.   Test Results: Pulmonary tests: ONO with RA 04/27/17 >> test time 8 hrs 21 min.  Basal SpO2 92%, low SpO2 83%.  Spent 18 min with SpO2 < 88%. ONO shows intermittent oxygen desaturation, and pattern is concerning for sleep apnea. CT angio chest 02/26/17 >> b/l PE with RV:LV ratio 1.2, ATX, small airway disease CT angio chest 03/19/17 >> decreased size of PE, ATX, small hiatal hernia  Cardiac tests: Echo 02/27/17 >> EF 60 to 65%, grade 1 DD, PAS 47 mmHg   CBC Latest Ref Rng & Units 03/19/2017 03/18/2017 03/10/2017  WBC 4.0 - 10.5 K/uL 7.0 7.3 5.8  Hemoglobin 12.0 - 15.0 g/dL 16.1 09.6 04.5  Hematocrit 36.0 - 46.0 % 40.5 42.4 42.3  Platelets 150 - 400 K/uL 209 236 266    BMP Latest Ref Rng & Units 03/19/2017 03/18/2017 03/18/2017  Glucose 65 - 99 mg/dL 88 93 93  BUN 6 - 20 mg/dL 11 10 10   Creatinine 0.44 - 1.00 mg/dL 4.09 8.11 9.14  BUN/Creat Ratio 12 - 28 - - 14  Sodium 135 - 145 mmol/L 140 139 139  Potassium 3.5 - 5.1 mmol/L 3.8 3.8 4.1  Chloride 101 - 111 mmol/L 108 106 105  CO2 22 - 32 mmol/L 22 24 21   Calcium 8.9 - 10.3 mg/dL 8.9 9.4 9.3    BNP    Component Value Date/Time   BNP 13.9 02/26/2017 2337    ProBNP No results found for: PROBNP  PFT No results found for: FEV1PRE, FEV1POST, FVCPRE, FVCPOST, TLC, DLCOUNC, PREFEV1FVCRT, PSTFEV1FVCRT  No results found.   Past medical hx Past Medical History:  Diagnosis Date  . Arthritis   . DVT (deep venous thrombosis) (HCC)   . Pulmonary embolism (HCC)   . Thyroid disease      Social History   Tobacco Use  . Smoking status: Never Smoker  . Smokeless tobacco: Never Used  Substance Use Topics  . Alcohol use: No    Frequency: Never  . Drug use: No    Ms.Wierenga reports that  has never smoked. she has never used smokeless tobacco. She reports that she does not drink alcohol or use drugs.  Tobacco Cessation: Never smoker  Past surgical hx, Family hx, Social hx all  reviewed.  Current Outpatient Medications on File Prior to Visit  Medication Sig  . acetaminophen (TYLENOL) 650 MG CR tablet Take 650 mg by mouth every 8 (eight) hours as needed for pain.  Marland Kitchen albuterol (PROAIR HFA) 108 (90 Base) MCG/ACT inhaler Inhale 2 puffs into the lungs See admin instructions. 2 puffs every four to six hours as needed for shortness of breath  . albuterol (PROVENTIL) (2.5 MG/3ML) 0.083% nebulizer solution Take 2.5 mg by nebulization every 4 (four) hours as needed for wheezing or shortness of breath (AS DIRECTED BY MD).   Marland Kitchen apixaban (ELIQUIS) 5 MG TABS tablet Take 1  tablet (5 mg total) by mouth 2 (two) times daily.  . benzonatate (TESSALON PERLES) 100 MG capsule Take 1 capsule (100 mg total) by mouth 3 (three) times daily as needed for cough.  . betamethasone dipropionate (DIPROLENE) 0.05 % ointment Apply 1 application topically 2 (two) times daily as needed (to affected areas).   . fluticasone (FLOVENT HFA) 110 MCG/ACT inhaler Inhale 2 puffs into the lungs 2 (two) times daily.  Marland Kitchen guaiFENesin (ROBITUSSIN) 100 MG/5ML SOLN Take 5 mLs (100 mg total) by mouth every 4 (four) hours as needed for cough or to loosen phlegm.  . hydrochlorothiazide (MICROZIDE) 12.5 MG capsule Take 12.5 mg by mouth daily as needed for edema or fluid.  Marland Kitchen HYDROcodone-acetaminophen (NORCO/VICODIN) 5-325 MG tablet Take 1 tablet by mouth every 8 (eight) hours as needed for pain.  Marland Kitchen HYDROcodone-homatropine (HYCODAN) 5-1.5 MG/5ML syrup TAKE 5 MLS BY MOUTH EVERY 4 (FOUR) HOURS AS NEEDED FOR UP TO 10 DAYS (COUGH).  Marland Kitchen levothyroxine (SYNTHROID, LEVOTHROID) 50 MCG tablet Take 50 mcg by mouth every Monday, Wednesday, and Friday.  . levothyroxine (SYNTHROID, LEVOTHROID) 75 MCG tablet Take 75 mcg by mouth every Tuesday, Thursday, Saturday, and Sunday.  . meclizine (ANTIVERT) 25 MG tablet Take 25 mg by mouth 3 (three) times daily as needed for dizziness.   . methocarbamol (ROBAXIN) 750 MG tablet Take 750 mg by mouth 3  (three) times daily as needed for muscle spasms.   . metoprolol succinate (TOPROL-XL) 25 MG 24 hr tablet Take 25 mg by mouth at bedtime.   . montelukast (SINGULAIR) 10 MG tablet Take 1 tablet (10 mg total) by mouth at bedtime.  Marland Kitchen omeprazole (PRILOSEC) 20 MG capsule Take 20 mg by mouth daily as needed (for reflux).   Marland Kitchen OVER THE COUNTER MEDICATION Remefemin tablets: Take 1 tablet by mouth two to three times a week  . predniSONE (DELTASONE) 10 MG tablet 3 pills daily for 2 days, 2 pills daily for 2 days, 1 pill daily for 2 days  . UNABLE TO FIND Compression stockings, daily   No current facility-administered medications on file prior to visit.      Allergies  Allergen Reactions  . Nsaids Other (See Comments)    Not suppose to take with her blood thinner  . Other Other (See Comments)    Extreme stomach pain if any of these are eaten: spicy foods, onions, fried foods, cauliflower, and broccoli  . Prednisone Other (See Comments)    Not suppose to take due to advanced osteoporosis  . Risedronate Other (See Comments)    (Actonel) Caused EXTREME JAW PAIN and muscles and joints ached   . Adhesive [Tape] Rash and Other (See Comments)    EKG pads left welts on the skin  . Codeine Palpitations and Other (See Comments)    Tachycardia   . Penicillins Rash    Has patient had a PCN reaction causing immediate rash, facial/tongue/throat swelling, SOB or lightheadedness with hypotension: Yes Has patient had a PCN reaction causing severe rash involving mucus membranes or skin necrosis: Unk Has patient had a PCN reaction that required hospitalization: Unk Has patient had a PCN reaction occurring within the last 10 years: Unk If all of the above answers are "NO", then may proceed with Cephalosporin use.     Review Of Systems:  Constitutional:   No  weight loss, night sweats,  Fevers, chills, fatigue, or  lassitude.  HEENT:   No headaches,  Difficulty swallowing,  Tooth/dental problems, or  Sore  throat,  No sneezing, itching, ear ache, nasal congestion, post nasal drip,   CV:  No chest pain,  Orthopnea, PND, swelling in lower extremities, anasarca, dizziness, palpitations, syncope.   GI  No heartburn, indigestion, abdominal pain, nausea, vomiting, diarrhea, change in bowel habits, loss of appetite, bloody stools.   Resp: + Baseline shortness of breath with exertion or at rest.  No excess mucus, no productive cough,  No non-productive cough,  No coughing up of blood.  No change in color of mucus.  No wheezing.  No chest wall deformity  Skin: no rash or lesions.  GU: no dysuria, change in color of urine, no urgency or frequency.  No flank pain, no hematuria   MS:  No joint pain or swelling.  No decreased range of motion.  No back pain.  Complains of intermittent faint right leg pain.  Psych:  No change in mood or affect. No depression or anxiety.  No memory loss.    Vital Signs BP 120/76 (BP Location: Left Arm, Cuff Size: Normal)   Pulse 96   Ht 5\' 6"  (1.676 m)   Wt 242 lb 9.6 oz (110 kg)   SpO2 99%   BMI 39.16 kg/m    Physical Exam:  General- No distress,  A&Ox3, pleasant ENT: No sinus tenderness, TM clear, pale nasal mucosa, no oral exudate,no post nasal drip, no LAN Cardiac: S1, S2, regular rate and rhythm, no murmur Chest: No wheeze/ rales/ dullness; no accessory muscle use, no nasal flaring, no sternal retractions Abd.: Soft Non-tender, nondistended, obese Ext: No clubbing cyanosis, no edema, wearing compression hose Neuro: Deconditioned at baseline, alert and oriented x3, moving all extremities x4 Skin: No rashes, warm and dry Psych: normal mood and behavior   Assessment/Plan  Simple chronic bronchitis (HCC) Compliant with prednisone, Flovent, singular, Flonase and Claritin Significantly better No cough at present Plan Continue Flovent two puffs bid - albuterol prn - singulair 10 mg qhs - she is to continue flonase and claritin We will  send  a prescription for Flonase  We will consider doing allergy testing at next visit per your preference Follow up with Dr. Craige Cotta or Maralyn Sago NP  after sleep test and echo. ( 1 month) Please contact office for sooner follow up if symptoms do not improve or worsen or seek emergency care    Pulmonary embolism (HCC) Unprovoked pulmonary embolism 02/19/2017 Compliant with Eliquis Plan Continue your Eliquis as you have been doing. Watch for bleeding. Let us know if you would like to do a lower extremity Doppler study Follow up with Cardiology as is scheduled ( Echo) Follow up with Dr. Craige Cotta or Maralyn Sago NP  after sleep test and echo. ( 1 month) Please contact office for sooner follow up if symptoms do not improve or worsen or seek emergency care    Suspected sleep disordered breathing Overnight oximetry indicated nocturnal desaturations Pattern highly suspicious for sleep apnea Plan We will order a home sleep study to evaluate you for sleep apnea. The organ you have follow up with Dr. Craige Cotta or Maralyn Sago NP  after sleep test and echo. ( 1 month) Please contact office for sooner follow up if symptoms do not improve or worsen or seek emergency care     Bevelyn Ngo, NP 05/06/2017  9:18 PM

## 2017-05-05 NOTE — Telephone Encounter (Signed)
ONO with RA 04/27/17 >> test time 8 hrs 21 min.  Basal SpO2 92%, low SpO2 83%.  Spent 18 min with SpO2 < 88%.   Pt has appointment with Kandice RobinsonsSarah Groce on 05/06/17.  Will route message to Maralyn SagoSarah.  ONO shows intermittent oxygen desaturation, and pattern is concerning for sleep apnea.

## 2017-05-05 NOTE — Telephone Encounter (Signed)
I will order nocturnal oxygen and a sleep study tomorrow. . Thanks

## 2017-05-06 ENCOUNTER — Ambulatory Visit: Payer: PPO | Admitting: Acute Care

## 2017-05-06 ENCOUNTER — Encounter: Payer: Self-pay | Admitting: Acute Care

## 2017-05-06 VITALS — BP 120/76 | HR 96 | Ht 66.0 in | Wt 242.6 lb

## 2017-05-06 DIAGNOSIS — J410 Simple chronic bronchitis: Secondary | ICD-10-CM | POA: Diagnosis not present

## 2017-05-06 DIAGNOSIS — I2782 Chronic pulmonary embolism: Secondary | ICD-10-CM | POA: Diagnosis not present

## 2017-05-06 DIAGNOSIS — G4733 Obstructive sleep apnea (adult) (pediatric): Secondary | ICD-10-CM | POA: Diagnosis not present

## 2017-05-06 DIAGNOSIS — J42 Unspecified chronic bronchitis: Secondary | ICD-10-CM

## 2017-05-06 HISTORY — DX: Simple chronic bronchitis: J41.0

## 2017-05-06 HISTORY — DX: Obstructive sleep apnea (adult) (pediatric): G47.33

## 2017-05-06 LAB — NITRIC OXIDE: Nitric Oxide: 34

## 2017-05-06 MED ORDER — FLUTICASONE PROPIONATE 50 MCG/ACT NA SUSP: 2.0000 | Freq: Every evening | NASAL | 3 refills | Status: DC | PRN

## 2017-05-06 NOTE — Assessment & Plan Note (Signed)
Suspected OSA

## 2017-05-06 NOTE — Patient Instructions (Addendum)
Continue Flovent two puffs bid - albuterol prn - singulair 10 mg qhs - she is to continue flonase and claritin We will send  a prescription for Flonase  We will consider doing allergy testing at next visit per your preference We will order a home sleep study to evaluate you for sleep apnea. Continue your Eliquis as you have been doing. Watch for bleeding. Let us know if you would like to do a lower extremity Doppler study Follow up with Cardiology as is scheduled ( Echo) Follow up with Dr. Craige CottaSood or Maralyn SagoSarah NP  after sleep test and echo. ( 1 month) Please contact office for sooner follow up if symptoms do not improve or worsen or seek emergency care

## 2017-05-06 NOTE — Assessment & Plan Note (Signed)
Compliant with prednisone, Flovent, singular, Flonase and Claritin Significantly better No cough at present Plan Continue Flovent two puffs bid - albuterol prn - singulair 10 mg qhs - she is to continue flonase and claritin We will send  a prescription for Flonase  We will consider doing allergy testing at next visit per your preference Follow up with Dr. Craige CottaSood or Maralyn SagoSarah NP  after sleep test and echo. ( 1 month) Please contact office for sooner follow up if symptoms do not improve or worsen or seek emergency care

## 2017-05-06 NOTE — Assessment & Plan Note (Signed)
Unprovoked pulmonary embolism 02/19/2017 Compliant with Eliquis Plan Continue your Eliquis as you have been doing. Watch for bleeding. Let us know if you would like to do a lower extremity Doppler study Follow up with Cardiology as is scheduled ( Echo) Follow up with Dr. Craige CottaSood or Maralyn SagoSarah NP  after sleep test and echo. ( 1 month) Please contact office for sooner follow up if symptoms do not improve or worsen or seek emergency care

## 2017-05-13 ENCOUNTER — Telehealth: Payer: Self-pay | Admitting: Pulmonary Disease

## 2017-05-13 NOTE — Telephone Encounter (Signed)
Entered in error

## 2017-05-15 ENCOUNTER — Encounter: Payer: Self-pay | Admitting: Cardiology

## 2017-05-15 ENCOUNTER — Ambulatory Visit (INDEPENDENT_AMBULATORY_CARE_PROVIDER_SITE_OTHER): Payer: PPO | Admitting: Cardiology

## 2017-05-15 VITALS — BP 118/68 | HR 90 | Ht 66.0 in | Wt 243.8 lb

## 2017-05-15 DIAGNOSIS — R0789 Other chest pain: Secondary | ICD-10-CM

## 2017-05-15 DIAGNOSIS — I2782 Chronic pulmonary embolism: Secondary | ICD-10-CM | POA: Diagnosis not present

## 2017-05-15 DIAGNOSIS — G4733 Obstructive sleep apnea (adult) (pediatric): Secondary | ICD-10-CM | POA: Diagnosis not present

## 2017-05-15 NOTE — Progress Notes (Signed)
Cardiology Office Note:    Date:  05/15/2017   ID:  Gail Olson, DOB [birth year], MRN [id number]  PCP:  Shellia Cleverly, PA  Cardiologist:  Gypsy Balsam, MD    Referring MD: Shellia Cleverly, [place]   Chief Complaint  Patient presents with  . 1 month follow up  Doing better  History of Present Illness:    Gail Olson is a 67 y.o. female with history of pulmonary emboli.  She had a rough time recovering.  The biggest complaint was cough as well as a lot of pleuritic pain.  All those issues are improving dramatically.  She is being seen by pulmonologist she was diagnosed with asthma and treated appropriately and since that time she is doing significantly better.  She also reports improvement in her tolerance to exercise.  She was able to walk into my office last time she was not.  She also was able to go to store and shop before she was unable to do it.  Overall clinically she is improving.  Past Medical History:  Diagnosis Date  . Arthritis   . DVT (deep venous thrombosis) (HCC)   . Pulmonary embolism (HCC)   . Thyroid disease     Past Surgical History:  Procedure Laterality Date  . APPENDECTOMY    . BACK SURGERY    . CHOLECYSTECTOMY      Current Medications: Current Meds  Medication Sig  . acetaminophen (TYLENOL) 650 MG CR tablet Take 650 mg by mouth every 8 (eight) hours as needed for pain.  Marland Kitchen albuterol (PROAIR HFA) 108 (90 Base) MCG/ACT inhaler Inhale 2 puffs into the lungs See admin instructions. 2 puffs every four to six hours as needed for shortness of breath  . albuterol (PROVENTIL) (2.5 MG/3ML) 0.083% nebulizer solution Take 2.5 mg by nebulization every 4 (four) hours as needed for wheezing or shortness of breath (AS DIRECTED BY MD).   Marland Kitchen apixaban (ELIQUIS) 5 MG TABS tablet Take 1 tablet (5 mg total) by mouth 2 (two) times daily.  . benzonatate (TESSALON PERLES) 100 MG capsule Take 1 capsule (100 mg total) by mouth 3 (three) times daily as needed for cough.  .  betamethasone dipropionate (DIPROLENE) 0.05 % ointment Apply 1 application topically 2 (two) times daily as needed (to affected areas).   . fluticasone (FLONASE) 50 MCG/ACT nasal spray Place 2 sprays into both nostrils at bedtime as needed for allergies or rhinitis.  . fluticasone (FLOVENT HFA) 110 MCG/ACT inhaler Inhale 2 puffs into the lungs 2 (two) times daily.  Marland Kitchen guaiFENesin (ROBITUSSIN) 100 MG/5ML SOLN Take 5 mLs (100 mg total) by mouth every 4 (four) hours as needed for cough or to loosen phlegm.  . hydrochlorothiazide (MICROZIDE) 12.5 MG capsule Take 12.5 mg by mouth daily as needed for edema or fluid.  Marland Kitchen HYDROcodone-acetaminophen (NORCO/VICODIN) 5-325 MG tablet Take 1 tablet by mouth every 8 (eight) hours as needed for pain.  Marland Kitchen HYDROcodone-homatropine (HYCODAN) 5-1.5 MG/5ML syrup TAKE 5 MLS BY MOUTH EVERY 4 (FOUR) HOURS AS NEEDED FOR UP TO 10 DAYS (COUGH).  Marland Kitchen levothyroxine (SYNTHROID, LEVOTHROID) 50 MCG tablet Take 50 mcg by mouth every Monday, Wednesday, and Friday.  . levothyroxine (SYNTHROID, LEVOTHROID) 75 MCG tablet Take 75 mcg by mouth every Tuesday, Thursday, Saturday, and Sunday.  . meclizine (ANTIVERT) 25 MG tablet Take 25 mg by mouth 3 (three) times daily as needed for dizziness.   . methocarbamol (ROBAXIN) 750 MG tablet Take 750 mg by mouth 3 (three) times  daily as needed for muscle spasms.   . metoprolol succinate (TOPROL-XL) 25 MG 24 hr tablet Take 25 mg by mouth at bedtime.   . montelukast (SINGULAIR) 10 MG tablet Take 1 tablet (10 mg total) by mouth at bedtime.  Marland Kitchen. omeprazole (PRILOSEC) 20 MG capsule Take 20 mg by mouth daily as needed (for reflux).   Marland Kitchen. OVER THE COUNTER MEDICATION Remefemin tablets: Take 1 tablet by mouth two to three times a week  . predniSONE (DELTASONE) 10 MG tablet 3 pills daily for 2 days, 2 pills daily for 2 days, 1 pill daily for 2 days  . UNABLE TO FIND Compression stockings, daily     Allergies:   Nsaids; Other; Prednisone; Risedronate; Adhesive  [tape]; Codeine; and Penicillins   Social History   Socioeconomic History  . Marital status: Married    Spouse name: None  . Number of children: None  . Years of education: None  . Highest education level: None  Social Needs  . Financial resource strain: None  . Food insecurity - worry: None  . Food insecurity - inability: None  . Transportation needs - medical: None  . Transportation needs - non-medical: None  Occupational History  . None  Tobacco Use  . Smoking status: Never Smoker  . Smokeless tobacco: Never Used  Substance and Sexual Activity  . Alcohol use: No    Frequency: Never  . Drug use: No  . Sexual activity: None  Other Topics Concern  . None  Social History Narrative  . None     Family History: The patient's family history includes Cancer in her mother; Hypertension in her brother, mother, and sister. There is no history of Pulmonary disease. ROS:   Please see the history of present illness.    All 14 point review of systems negative except as described per history of present illness  EKGs/Labs/Other Studies Reviewed:      Recent Labs: 02/26/2017: B Natriuretic Peptide 13.9 03/19/2017: BUN 11; Creatinine, Ser 0.77; Hemoglobin 13.6; Platelets 209; Potassium 3.8; Sodium 140  Recent Lipid Panel No results found for: CHOL, TRIG, HDL, CHOLHDL, VLDL, LDLCALC, LDLDIRECT  Physical Exam:    VS:  BP 118/68   Pulse 90   Ht 5\' 6"  (1.676 m)   Wt 243 lb 12.8 oz (110.6 kg)   SpO2 97%   BMI 39.35 kg/m     Wt Readings from Last 3 Encounters:  05/15/17 243 lb 12.8 oz (110.6 kg)  05/06/17 242 lb 9.6 oz (110 kg)  04/22/17 241 lb (109.3 kg)     GEN:  Well nourished, well developed in no acute distress HEENT: Normal NECK: No JVD; No carotid bruits LYMPHATICS: No lymphadenopathy CARDIAC: RRR, no murmurs, no rubs, no gallops RESPIRATORY:  Clear to auscultation without rales, wheezing or rhonchi  ABDOMEN: Soft, non-tender, non-distended MUSCULOSKELETAL:  No  edema; No deformity  SKIN: Warm and dry LOWER EXTREMITIES: no swelling NEUROLOGIC:  Alert and oriented x 3 PSYCHIATRIC:  Normal affect   ASSESSMENT:    1. OSA (obstructive sleep apnea)   2. Atypical chest pain   3. Other chronic pulmonary embolism without acute cor pulmonale (HCC)    PLAN:    In order of problems listed above:  1. History of pulmonary emboli.  Getting better on appropriate medications continue anticoagulation. 2. Atypical chest pain: Pleuritic but denies having any now. 3. Obstructive sleep apnea: She is scheduled to have sleep study within next 2-3 weeks we will wait for results of the test.  4. Pulmonary hypertension pulmonary pressure in the neighborhood of 47 mmHg.  We will continue monitoring.  Will repeat echocardiogram within next few months and will consider therapy.   Medication Adjustments/Labs and Tests Ordered: Current medicines are reviewed at length with the patient today.  Concerns regarding medicines are outlined above.  No orders of the defined types were placed in this encounter.  Medication changes: No orders of the defined types were placed in this encounter.   Signed, Georgeanna Lea, MD, [facility] 05/15/2017 10:40 AM    [facility]

## 2017-05-15 NOTE — Patient Instructions (Signed)
Medication Instructions:  Your physician recommends that you continue on your current medications as directed. Please refer to the Current Medication list given to you today.  Labwork: None  Testing/Procedures: None  Follow-Up: Your physician wants you to follow-up in: 4 months. You will receive a reminder letter in the mail two months in advance. If you don't receive a letter, please call our office to schedule the follow-up appointment.  Any Other Special Instructions Will Be Listed Below (If Applicable).     If you need a refill on your cardiac medications before your next appointment, please call your pharmacy.   

## 2017-05-21 ENCOUNTER — Encounter: Payer: Self-pay | Admitting: Acute Care

## 2017-05-21 ENCOUNTER — Other Ambulatory Visit (INDEPENDENT_AMBULATORY_CARE_PROVIDER_SITE_OTHER): Payer: PPO

## 2017-05-21 ENCOUNTER — Ambulatory Visit: Payer: PPO | Admitting: Acute Care

## 2017-05-21 VITALS — BP 130/80 | HR 90 | Ht 66.0 in | Wt 241.0 lb

## 2017-05-21 DIAGNOSIS — J42 Unspecified chronic bronchitis: Secondary | ICD-10-CM

## 2017-05-21 DIAGNOSIS — I2721 Secondary pulmonary arterial hypertension: Secondary | ICD-10-CM

## 2017-05-21 DIAGNOSIS — J410 Simple chronic bronchitis: Secondary | ICD-10-CM | POA: Diagnosis not present

## 2017-05-21 HISTORY — DX: Secondary pulmonary arterial hypertension: I27.21

## 2017-05-21 LAB — NITRIC OXIDE: NITRIC OXIDE: 41

## 2017-05-21 LAB — CBC WITH DIFFERENTIAL/PLATELET
Basophils Absolute: 0.1 10*3/uL (ref 0.0–0.1)
Basophils Relative: 2.6 % (ref 0.0–3.0)
EOS ABS: 0.3 10*3/uL (ref 0.0–0.7)
Eosinophils Relative: 5.5 % — ABNORMAL HIGH (ref 0.0–5.0)
HCT: 41.6 % (ref 36.0–46.0)
Hemoglobin: 13.9 g/dL (ref 12.0–15.0)
LYMPHS ABS: 1.9 10*3/uL (ref 0.7–4.0)
Lymphocytes Relative: 32.6 % (ref 12.0–46.0)
MCHC: 33.4 g/dL (ref 30.0–36.0)
MCV: 88 fl (ref 78.0–100.0)
MONO ABS: 0.9 10*3/uL (ref 0.1–1.0)
Monocytes Relative: 15 % — ABNORMAL HIGH (ref 3.0–12.0)
NEUTROS PCT: 44.3 % (ref 43.0–77.0)
Neutro Abs: 2.6 10*3/uL (ref 1.4–7.7)
Platelets: 249 10*3/uL (ref 150.0–400.0)
RBC: 4.73 Mil/uL (ref 3.87–5.11)
RDW: 14 % (ref 11.5–15.5)
WBC: 5.8 10*3/uL (ref 4.0–10.5)

## 2017-05-21 MED ORDER — PREDNISONE 10 MG PO TABS: ORAL_TABLET | ORAL | 0 refills | Status: DC

## 2017-05-21 MED ORDER — METHYLPREDNISOLONE ACETATE 80 MG/ML IJ SUSP
120.0000 mg | Freq: Once | INTRAMUSCULAR | Status: AC
  Administered 2017-05-21: 120 mg via INTRAMUSCULAR

## 2017-05-21 MED ORDER — AMOXICILLIN-POT CLAVULANATE 875-125 MG PO TABS: 1.0000 | ORAL_TABLET | Freq: Two times a day (BID) | ORAL | 0 refills | Status: DC

## 2017-05-21 MED ORDER — BENZONATATE 200 MG PO CAPS: 200.0000 mg | ORAL_CAPSULE | Freq: Three times a day (TID) | ORAL | 1 refills | Status: DC | PRN

## 2017-05-21 NOTE — Assessment & Plan Note (Signed)
Flare Plan We will renew your prescription for Tessalon Perles.( 200 mg tablets 3 times a day ) for cough. Do not drive if sleepy. Depo Medrol 120 mg now  Start the prednisone taper tomorrow. Prednisone Taper 30 mg x 2 days, 20 mg x 2 days, 10 mg x 2 days, then stop. Continue Flovent two puffs bid - albuterol prn - singulair 10 mg qhs - she is to continue flonase and claritin We will refer to allergy for evaluation. We will order RAST, CBC with diff and IgE today. We will reschedule sleep test once you are better. Augmentin 875 mg twice daily x 7 days for sinusitis. Probiotic while on antibiotic Mucinex 1200 mg twice daily with full glass of water. Follow up in 2 weeks with Dr. Craige CottaSood or Maralyn SagoSarah NP We [facility] Craige CottaSood at next visit. Please contact office for sooner follow up if symptoms do not improve or worsen or seek emergency care   Remember, sips of water instead of throat clearing Sugar free jolly ranchers or Werther's hard candies for throat soothing Avoid mint and menthol, chocolate  and caffeine.

## 2017-05-21 NOTE — Assessment & Plan Note (Signed)
Pulmonary artery systolic pressure 47 mmHg Plan We will need to discuss with Dr. Craige CottaSood plan of care for treatment

## 2017-05-21 NOTE — Patient Instructions (Addendum)
It is good to see you again. We will [facility] your [facility].( 200 mg tablets 3 times a day ) for cough. Do not drive if sleepy. Depo Medrol 120 mg now  Start the prednisone taper tomorrow. Prednisone Taper 30 mg x 2 days, 20 mg x 2 days, 10 mg x 2 days, then stop. Continue Flovent two puffs bid - albuterol prn - singulair 10 mg qhs - she is to continue flonase and claritin We will refer to allergy for evaluation. We will order RAST, CBC with diff and IgE today. We will reschedule sleep test once you are better. Augmentin 875 mg twice daily x 7 days for sinusitis. Probiotic while on antibiotic Mucinex 1200 mg twice daily with full glass of water. Follow up in 2 weeks with Dr. Craige CottaSood or Maralyn SagoSarah NP [facility]ood at next visit. Please contact office for sooner follow up if symptoms do not improve or worsen or seek emergency care   Remember, sips of water instead of throat clearing Sugar free jolly ranchers or Werther's hard candies for throat soothing Avoid mint and menthol, chocolate  and caffeine.

## 2017-05-21 NOTE — Progress Notes (Addendum)
History of Present Illness Gail Olson is a 67 y.o. female  never smoker with chronic bronchitis? Allergic asthma, ? Sleep disorder breathing, recent ( 01/2017) unprovoked PE, left  leg DVT. (On Eliquis) .She is followed by Dr. Craige Cotta.  History of Present Illness: Gail Olson a 67 y.o.female never smoker with dyspnea. She was seen by Dr. Craige Cotta 04/22/2017 for consult .   She has noticed trouble with her breathing and cough for several years. She has multiple allergies. She was tried on allergy shots before, but this made her symptoms worse. She gets cough, wheeze, and chest congestion. She can bring up yellow sputum. She has been tried on prednisone and albuterol, and both of these seem to help. She can get into long coughing spells, and then feel like she is going to pass out. She also gets sinus congestion. She has been using claritin and flonase.  She was found to have a Lt leg DVT and acute PE in December 2018. She was started on eliquis. It doesn't appear that there was any inciting event identified for her thromboembolic event. She had follow up CT chest in January 2019 that showed significant improvement in clot burden. She is being anticoagulated with Eliquis.Of note: on multiple previous CT chests she has been noted to have atelectasis and mosaic pattern with suggestion of small airway disease.   She is from [place], but has lived in [place] most of her adult life. [place]. She had several family members who smoked, but she never smoked. She is disabled due to osteoporosis. No animal or bird exposures. She had pneumonia several years ago. No history of exposure to tuberculosis.    Discussion: She has persistent cough, wheeze, and dyspnea. Her symptoms predate her episode of DVT and PE. She has findings of small airway disease on previous CT chest. Dr. Curt Jews she might have allergic asthma as the cause of her  current symptoms rather than sequela from recent PE. She might also have sleep disordered breathing.    05/21/2017  Pt. Presents for an acute visit:She was last seen for an acute visit with Dr. Craige Cotta on 04/22/2017 for consult. He treated her at that time for a flare as follows: Chronic bronchitis with possible allergic asthma. - will give course of prednisone - flovent two puffs bid - albuterol prn - singulair 10 mg qhs - she is to continue flonase and claritin - depending on her symptom status at follow up, she will then need to be set up for PFTs - might need further allergy testing at some point with RAST and IgE, and CBC with differential  At follow up 05/06/2017 she was doing well. Plan was as follows:  Simple chronic bronchitis (HCC) Compliant with prednisone, Flovent, singular, Flonase and Claritin Significantly better No cough at present Plan Continue Flovent two puffs bid - albuterol prn - singulair 10 mg qhs - she is to continue flonase and claritin We will send  a prescription for Flonase  We will consider doing allergy testing at next visit per your preference Please contact office for sooner follow up if symptoms do not improve or worsen or seek emergency care   Additionally we planned for Sleep Study to evaluate for OSA, and initiated nocturnal oxygen as her O&O inducted desaturations. She has not had sleep study or PFT's that were ordered at the last visit. We will need to wait until she is over current flare. She had  deferred on allergy testing and LE doppler studies at that visit.   She presents today with another bronchitis  flare. She does not want to do her sleep study until her cough is better. Additionally she has not had her PFT's done. She states this flare started 05/14/2017. She states the cough is keeping her up. She has secretions that are grey in color, and she has thick nasal secretions. She has tenderness over her eyes over her frontal sinuses. Additionally  she has tooth pain. She denies fever, chest pain, orthopnea or hemoptysis.   Test Results: FENO 41 ppb >> 05/20/2017 Pulmonary tests: ONO with RA 04/27/17 >>test time 8 hrs 21 min. Basal SpO2 92%, low SpO2 83%. Spent 18 min with SpO2 <88%. ONO shows intermittent oxygen desaturation, and pattern is concerning for sleep apnea. CT angio chest 02/26/17 >> b/l PE with RV:LV ratio 1.2, ATX, small airway disease CT angio chest 03/19/17 >> decreased size of PE, ATX, small hiatal hernia  Cardiac tests: Echo 02/27/17 >> EF 60 to 65%, grade 1 DD, PAS 47 mmHg   CBC Latest Ref Rng & Units 05/21/2017 03/19/2017 03/18/2017  WBC 4.0 - 10.5 K/uL 5.8 7.0 7.3  Hemoglobin 12.0 - 15.0 g/dL 78.2 95.6 21.3  Hematocrit 36.0 - 46.0 % 41.6 40.5 42.4  Platelets 150.0 - 400.0 K/uL 249.0 209 236    BMP Latest Ref Rng & Units 03/19/2017 03/18/2017 03/18/2017  Glucose 65 - 99 mg/dL 88 93 93  BUN 6 - 20 mg/dL 11 10 10   Creatinine 0.44 - 1.00 mg/dL 0.86 5.78 4.69  BUN/Creat Ratio 12 - 28 - - 14  Sodium 135 - 145 mmol/L 140 139 139  Potassium 3.5 - 5.1 mmol/L 3.8 3.8 4.1  Chloride 101 - 111 mmol/L 108 106 105  CO2 22 - 32 mmol/L 22 24 21   Calcium 8.9 - 10.3 mg/dL 8.9 9.4 9.3    BNP    Component Value Date/Time   BNP 13.9 02/26/2017 2337    ProBNP No results found for: PROBNP  PFT No results found for: FEV1PRE, FEV1POST, FVCPRE, FVCPOST, TLC, DLCOUNC, PREFEV1FVCRT, PSTFEV1FVCRT  No results found.   Past medical hx Past Medical History:  Diagnosis Date  . Arthritis   . DVT (deep venous thrombosis) (HCC)   . Pulmonary embolism (HCC)   . Thyroid disease      Social History   Tobacco Use  . Smoking status: Never Smoker  . Smokeless tobacco: Never Used  Substance Use Topics  . Alcohol use: No    Frequency: Never  . Drug use: No    Ms.Klenke reports that she has never smoked. She has never used smokeless tobacco. She reports that she does not drink alcohol or use drugs.  Tobacco  Cessation: Never smoker  Past surgical hx, Family hx, Social hx all reviewed.  Current Outpatient Medications on File Prior to Visit  Medication Sig  . acetaminophen (TYLENOL) 650 MG CR tablet Take 650 mg by mouth every 8 (eight) hours as needed for pain.  Marland Kitchen albuterol (PROAIR HFA) 108 (90 Base) MCG/ACT inhaler Inhale 2 puffs into the lungs See admin instructions. 2 puffs every four to six hours as needed for shortness of breath  . albuterol (PROVENTIL) (2.5 MG/3ML) 0.083% nebulizer solution Take 2.5 mg by nebulization every 4 (four) hours as needed for wheezing or shortness of breath (AS DIRECTED BY MD).   Marland Kitchen apixaban (ELIQUIS) 5 MG TABS tablet Take 1 tablet (5 mg total) by mouth 2 (two) times  daily.  . betamethasone dipropionate (DIPROLENE) 0.05 % ointment Apply 1 application topically 2 (two) times daily as needed (to affected areas).   . fluticasone (FLONASE) 50 MCG/ACT nasal spray Place 2 sprays into both nostrils at bedtime as needed for allergies or rhinitis.  . fluticasone (FLOVENT HFA) 110 MCG/ACT inhaler Inhale 2 puffs into the lungs 2 (two) times daily.  . hydrochlorothiazide (MICROZIDE) 12.5 MG capsule Take 12.5 mg by mouth daily as needed for edema or fluid.  Marland Kitchen. HYDROcodone-acetaminophen (NORCO/VICODIN) 5-325 MG tablet Take 1 tablet by mouth every 8 (eight) hours as needed for pain.  Marland Kitchen. HYDROcodone-homatropine (HYCODAN) 5-1.5 MG/5ML syrup TAKE 5 MLS BY MOUTH EVERY 4 (FOUR) HOURS AS NEEDED FOR UP TO 10 DAYS (COUGH).  Marland Kitchen. levothyroxine (SYNTHROID, LEVOTHROID) 50 MCG tablet Take 50 mcg by mouth every Monday, Wednesday, and Friday.  . levothyroxine (SYNTHROID, LEVOTHROID) 75 MCG tablet Take 75 mcg by mouth every Tuesday, Thursday, Saturday, and Sunday.  . meclizine (ANTIVERT) 25 MG tablet Take 25 mg by mouth 3 (three) times daily as needed for dizziness.   . methocarbamol (ROBAXIN) 750 MG tablet Take 750 mg by mouth 3 (three) times daily as needed for muscle spasms.   . metoprolol succinate  (TOPROL-XL) 25 MG 24 hr tablet Take 25 mg by mouth at bedtime.   . montelukast (SINGULAIR) 10 MG tablet Take 1 tablet (10 mg total) by mouth at bedtime.  Marland Kitchen. omeprazole (PRILOSEC) 20 MG capsule Take 20 mg by mouth daily as needed (for reflux).   Marland Kitchen. OVER THE COUNTER MEDICATION Remefemin tablets: Take 1 tablet by mouth two to three times a week  . UNABLE TO FIND Compression stockings, daily   No current facility-administered medications on file prior to visit.      Allergies  Allergen Reactions  . Nsaids Other (See Comments)    Not suppose to take with her blood thinner  . Other Other (See Comments)    Extreme stomach pain if any of these are eaten: spicy foods, onions, fried foods, cauliflower, and broccoli  . Prednisone Other (See Comments)    Not suppose to take due to advanced osteoporosis  . Risedronate Other (See Comments)    (Actonel) Caused EXTREME JAW PAIN and muscles and joints ached   . Adhesive [Tape] Rash and Other (See Comments)    EKG pads left welts on the skin  . Codeine Palpitations and Other (See Comments)    Tachycardia   . Penicillins Rash    Has patient had a PCN reaction causing immediate rash, facial/tongue/throat swelling, SOB or lightheadedness with hypotension: Yes Has patient had a PCN reaction causing severe rash involving mucus membranes or skin necrosis: Unk Has patient had a PCN reaction that required hospitalization: Unk Has patient had a PCN reaction occurring within the last 10 years: Unk If all of the above answers are "NO", then may proceed with Cephalosporin use.     Review Of Systems:  Constitutional:   No  weight loss, night sweats,  Fevers, chills, fatigue, or  lassitude.  HEENT:   No headaches,  Difficulty swallowing,  Tooth/dental problems, or  Sore throat,                No sneezing, itching, ear ache, +nasal congestion,+ post nasal drip,   CV:  No chest pain,  Orthopnea, PND, swelling in lower extremities, anasarca, dizziness,  palpitations, syncope.   GI  No heartburn, indigestion, abdominal pain, nausea, vomiting, diarrhea, change in bowel habits, loss of appetite, bloody  stools.   Resp: + shortness of breath with exertion or at rest.  + excess mucus, + productive cough,  No non-productive cough,  No coughing up of blood.  + change in color of mucus.  + wheezing.  No chest wall deformity  Skin: no rash or lesions.  GU: no dysuria, change in color of urine, no urgency or frequency.  No flank pain, no hematuria   MS:  No joint pain or swelling.  No decreased range of motion.  No back pain.  Psych:  No change in mood or affect. No depression or anxiety.  No memory loss.   Vital Signs BP 130/80 (BP Location: Left Arm, Cuff Size: Normal)   Pulse 90   Ht 5\' 6"  (1.676 m)   Wt 241 lb (109.3 kg)   SpO2 95%   BMI 38.90 kg/m    Physical Exam:  General- No distress,  A&Ox3 pleasant, coughing approach ENT: No sinus tenderness, TM clear, pale nasal mucosa, no oral exudate,+ post nasal drip, no  LAN Cardiac: S1, S2, regular rate and rhythm, no murmur Chest: + wheeze/no  rales/ dullness; no accessory muscle use, no nasal flaring, no sternal retractions Abd.: Soft Non-tender, nondistended, obese 1 Ext: No clubbing cyanosis, edema Neuro:  normal strength Skin: No rashes, warm and dry Psych: normal mood and behavior   Assessment/Plan  Simple chronic bronchitis (HCC) Flare Plan We will renew your prescription for Tessalon Perles.( 200 mg tablets 3 times a day ) for cough. Do not drive if sleepy. Depo Medrol 120 mg now  Start the prednisone taper tomorrow. Prednisone Taper 30 mg x 2 days, 20 mg x 2 days, 10 mg x 2 days, then stop. Continue Flovent two puffs bid - albuterol prn - singulair 10 mg qhs - she is to continue flonase and claritin We will refer to allergy for evaluation. We will order RAST, CBC with diff and IgE today. We will reschedule sleep test once you are better. Augmentin 875 mg twice  daily x 7 days for sinusitis. Probiotic while on antibiotic Mucinex 1200 mg twice daily with full glass of water. Follow up in 2 weeks with Dr. Craige Cotta or Maralyn Sago NP We will need to address [facility] management with Dr. Craige Cotta at next visit. Please contact office for sooner follow up if symptoms do not improve or worsen or seek emergency care   Remember, sips of water instead of throat clearing Sugar free jolly ranchers or Werther's hard candies for throat soothing Avoid mint and menthol, chocolate  and caffeine.  Pulmonary artery hypertension (HCC) Pulmonary artery systolic pressure 47 mmHg Plan We will need to discuss with Dr. Craige Cotta plan of care for treatment    Bevelyn Ngo, NP 05/21/2017  2:47 PM

## 2017-05-22 LAB — RESPIRATORY ALLERGY PROFILE REGION II ~~LOC~~
Allergen, Cedar tree, t12: <0.1 kU/L
Allergen, Mouse Urine Protein, e78: <0.1 kU/L
Allergen, Mulberry, t76: <0.1 kU/L
Allergen, Oak,t7: <0.1 kU/L
Aspergillus fumigatus, m3: <0.1 kU/L
CLADOSPORIUM HERBARUM (M2) IGE: <0.1 kU/L
CLASS: 0
CLASS: 0
CLASS: 0
CLASS: 0
CLASS: 0
CLASS: 0
CLASS: 0
CLASS: 0
CLASS: 0
CLASS: 0
COMMON RAGWEED (SHORT) (W1) IGE: <0.1 kU/L
Cat Dander: <0.1 kU/L
Class: 0
Class: 0
Class: 0
Class: 0
Class: 0
Class: 0
Class: 0
Class: 0
Class: 0
Class: 0
Class: 0
Class: 0
Class: 0
Class: 0
Cockroach: <0.1 kU/L
Pecan/Hickory Tree IgE: <0.1 kU/L
Rough Pigweed  IgE: <0.1 kU/L
Timothy Grass: <0.1 kU/L

## 2017-05-22 LAB — INTERPRETATION:

## 2017-06-10 ENCOUNTER — Ambulatory Visit: Payer: PPO | Admitting: Acute Care

## 2017-06-10 ENCOUNTER — Encounter: Payer: Self-pay | Admitting: Acute Care

## 2017-06-10 DIAGNOSIS — J410 Simple chronic bronchitis: Secondary | ICD-10-CM | POA: Diagnosis not present

## 2017-06-10 DIAGNOSIS — G4733 Obstructive sleep apnea (adult) (pediatric): Secondary | ICD-10-CM

## 2017-06-10 MED ORDER — LORATADINE 10 MG PO TABS: 10.0000 mg | ORAL_TABLET | Freq: Every day | ORAL | 11 refills | Status: AC

## 2017-06-10 NOTE — Assessment & Plan Note (Signed)
Suspected OSA Plan: Home Sleep Study Has agreed to trying nasal pillows if she is + for OSA

## 2017-06-10 NOTE — Progress Notes (Signed)
History of Present Illness Gail Olson is a 67 y.o. female never smokerwithchronic bronchitis? Allergic asthma, ? Sleep disorder breathing, recent ( 01/2017) unprovoked PE, left leg DVT. (On Eliquis) .She is followed by Dr. Craige Cotta.     06/10/2017 Follow Up OSA: Pt. Presents for follow up. She was last seen 05/20/2017 for a bronchitis flare.Plan at that visit is as follows:  Assessment/Plan  Simple chronic bronchitis (HCC) Flare Plan We will renew your prescription for Tessalon Perles.( 200 mg tablets 3 times a day ) for cough. Do not drive if sleepy. Depo Medrol 120 mg now  Start the prednisone taper tomorrow. Prednisone Taper 30 mg x 2 days, 20 mg x 2 days, 10 mg x 2 days, then stop. Continue Flovent two puffs bid - albuterol prn - singulair 10 mg qhs - she is to continue flonase and claritin We will refer to allergy for evaluation. We will order RAST, CBC with diff and IgE today. We will reschedule sleep test once you are better. Augmentin 875 mg twice daily x 7 days for sinusitis. Probiotic while on antibiotic Mucinex 1200 mg twice daily with full glass of water. Follow up in 2 weeks with Dr. Craige Cotta or Maralyn Sago NP We will need to address [facility] management with Dr. Craige Cotta at next visit. Please contact office for sooner follow up if symptoms do not improve or worsen or seek emergency care   Remember, sips of water instead of throat clearing Sugar free jolly ranchers or Werther's hard candies for throat soothing Avoid mint and menthol, chocolate  and caffeine.  Pulmonary artery hypertension (HCC) Pulmonary artery systolic pressure 47 mmHg Plan We will need to discuss with Dr. Craige Cotta plan of care for treatment  Additionally recent O&O shows nocturnal desaturations.She presents today for follow up.She state she is much better after the above interventions. She said her cough is better. She has run out of her Claritin and Mucinex. She will pick that up Friday when her husband  gets paid. She completed her prednisone. She completed her antibiotics.Pt. States she is compliant with her home regimen.She states she is worried about testing for need for CPAP due to her claustrophobia.She is willing to wear a nasal pillow.She states she does have a lot of pain at night. I have asked her to follow up with her PCP. She denies fever, chest pain, orthopnea or hemoptysis.  Test Results: FENO 41 ppb >> 05/20/2017 Pulmonary tests: ONO with RA 04/27/17 >>test time 8 hrs 21 min. Basal SpO2 92%, low SpO2 83%. Spent 18 min with SpO2 <88%. ONO shows intermittent oxygen desaturation, and pattern is concerning for sleep apnea. CT angio chest 02/26/17 >> b/l PE with RV:LV ratio 1.2, ATX, small airway disease CT angio chest 03/19/17 >> decreased size of PE, ATX, small hiatal hernia  Cardiac tests: Echo 02/27/17 >> EF 60 to 65%, grade 1 DD, PAS 47 mmHg   CBC Latest Ref Rng & Units 05/21/2017 03/19/2017 03/18/2017  WBC 4.0 - 10.5 K/uL 5.8 7.0 7.3  Hemoglobin 12.0 - 15.0 g/dL 47.8 29.5 62.1  Hematocrit 36.0 - 46.0 % 41.6 40.5 42.4  Platelets 150.0 - 400.0 K/uL 249.0 209 236    BMP Latest Ref Rng & Units 03/19/2017 03/18/2017 03/18/2017  Glucose 65 - 99 mg/dL 88 93 93  BUN 6 - 20 mg/dL 11 10 10   Creatinine 0.44 - 1.00 mg/dL 3.08 6.57 8.46  BUN/Creat Ratio 12 - 28 - - 14  Sodium 135 - 145 mmol/L 140 139 139  Potassium 3.5 - 5.1 mmol/L 3.8 3.8 4.1  Chloride 101 - 111 mmol/L 108 106 105  CO2 22 - 32 mmol/L 22 24 21   Calcium 8.9 - 10.3 mg/dL 8.9 9.4 9.3    BNP    Component Value Date/Time   BNP 13.9 02/26/2017 2337    ProBNP No results found for: PROBNP  PFT No results found for: FEV1PRE, FEV1POST, FVCPRE, FVCPOST, TLC, DLCOUNC, PREFEV1FVCRT, PSTFEV1FVCRT  No results found.   Past medical hx Past Medical History:  Diagnosis Date  . Arthritis   . DVT (deep venous thrombosis) (HCC)   . Pulmonary embolism (HCC)   . Thyroid disease      Social History   Tobacco Use    . Smoking status: Never Smoker  . Smokeless tobacco: Never Used  Substance Use Topics  . Alcohol use: No    Frequency: Never  . Drug use: No    [name] reports that she has never smoked. She has never used smokeless tobacco. She reports that she does not drink alcohol or use drugs.  Tobacco Cessation: Never smoker  Past surgical hx, Family hx, Social hx all reviewed.  Current Outpatient Medications on File Prior to Visit  Medication Sig  . acetaminophen (TYLENOL) 650 MG CR tablet Take 650 mg by mouth every 8 (eight) hours as needed for pain.  Marland Kitchen albuterol (PROAIR HFA) 108 (90 Base) MCG/ACT inhaler Inhale 2 puffs into the lungs See admin instructions. 2 puffs every four to six hours as needed for shortness of breath  . albuterol (PROVENTIL) (2.5 MG/3ML) 0.083% nebulizer solution Take 2.5 mg by nebulization every 4 (four) hours as needed for wheezing or shortness of breath (AS DIRECTED BY MD).   Marland Kitchen amoxicillin-clavulanate (AUGMENTIN) 875-125 MG tablet Take 1 tablet by mouth 2 (two) times daily.  Marland Kitchen apixaban (ELIQUIS) 5 MG TABS tablet Take 1 tablet (5 mg total) by mouth 2 (two) times daily.  . benzonatate (TESSALON) 200 MG capsule Take 1 capsule (200 mg total) by mouth 3 (three) times daily as needed for cough.  . betamethasone dipropionate (DIPROLENE) 0.05 % ointment Apply 1 application topically 2 (two) times daily as needed (to affected areas).   . fluticasone (FLONASE) 50 MCG/ACT nasal spray Place 2 sprays into both nostrils at bedtime as needed for allergies or rhinitis.  . fluticasone (FLOVENT HFA) 110 MCG/ACT inhaler Inhale 2 puffs into the lungs 2 (two) times daily.  . hydrochlorothiazide (MICROZIDE) 12.5 MG capsule Take 12.5 mg by mouth daily as needed for edema or fluid.  Marland Kitchen HYDROcodone-acetaminophen (NORCO/VICODIN) 5-325 MG tablet Take 1 tablet by mouth every 8 (eight) hours as needed for pain.  Marland Kitchen HYDROcodone-homatropine (HYCODAN) 5-1.5 MG/5ML syrup TAKE 5 MLS BY MOUTH EVERY 4  (FOUR) HOURS AS NEEDED FOR UP TO 10 DAYS (COUGH).  Marland Kitchen levothyroxine (SYNTHROID, LEVOTHROID) 50 MCG tablet Take 50 mcg by mouth every Monday, Wednesday, and Friday.  . levothyroxine (SYNTHROID, LEVOTHROID) 75 MCG tablet Take 75 mcg by mouth every Tuesday, Thursday, Saturday, and Sunday.  . meclizine (ANTIVERT) 25 MG tablet Take 25 mg by mouth 3 (three) times daily as needed for dizziness.   . methocarbamol (ROBAXIN) 750 MG tablet Take 750 mg by mouth 3 (three) times daily as needed for muscle spasms.   . metoprolol succinate (TOPROL-XL) 25 MG 24 hr tablet Take 25 mg by mouth at bedtime.   . montelukast (SINGULAIR) 10 MG tablet Take 1 tablet (10 mg total) by mouth at bedtime.  Marland Kitchen omeprazole (PRILOSEC) 20 MG capsule  Take 20 mg by mouth daily as needed (for reflux).   Marland Kitchen OVER THE COUNTER MEDICATION Remefemin tablets: Take 1 tablet by mouth two to three times a week  . predniSONE (DELTASONE) 10 MG tablet Take then 3 tabs for 2 days, 2 tabs for 2 days, then 1 tab for 2 days, then stop.  Marland Kitchen UNABLE TO FIND Compression stockings, daily   No current facility-administered medications on file prior to visit.      Allergies  Allergen Reactions  . Nsaids Other (See Comments)    Not suppose to take with her blood thinner  . Other Other (See Comments)    Extreme stomach pain if any of these are eaten: spicy foods, onions, fried foods, cauliflower, and broccoli  . Prednisone Other (See Comments)    Not suppose to take due to advanced osteoporosis  . Risedronate Other (See Comments)    (Actonel) Caused EXTREME JAW PAIN and muscles and joints ached   . Adhesive [Tape] Rash and Other (See Comments)    EKG pads left welts on the skin  . Codeine Palpitations and Other (See Comments)    Tachycardia   . Penicillins Rash    Has patient had a PCN reaction causing immediate rash, facial/tongue/throat swelling, SOB or lightheadedness with hypotension: Yes Has patient had a PCN reaction causing severe rash involving  mucus membranes or skin necrosis: Unk Has patient had a PCN reaction that required hospitalization: Unk Has patient had a PCN reaction occurring within the last 10 years: Unk If all of the above answers are "NO", then may proceed with Cephalosporin use.     Review Of Systems:  Constitutional:   No  weight loss, night sweats,  Fevers, chills, fatigue, or  lassitude.  HEENT:   No headaches,  Difficulty swallowing,  Tooth/dental problems, or  Sore throat,                No sneezing, itching, ear ache, nasal congestion, post nasal drip,   CV:  No chest pain,  Orthopnea, PND, swelling in lower extremities, anasarca, dizziness, palpitations, syncope.   GI  No heartburn, indigestion, abdominal pain, nausea, vomiting, diarrhea, change in bowel habits, loss of appetite, bloody stools.   Resp: No shortness of breath with exertion or at rest.  No excess mucus, no productive cough,  + non-productive cough,  No coughing up of blood.  No change in color of mucus.  No wheezing.  No chest wall deformity  Skin: no rash or lesions.  GU: no dysuria, change in color of urine, no urgency or frequency.  No flank pain, no hematuria   MS:  No joint pain or swelling.  No decreased range of motion.  No back pain.  Psych:  No change in mood or affect. No depression or anxiety.  No memory loss.   Vital Signs BP 130/82 (BP Location: Left Arm, Cuff Size: Normal)   Pulse 82   Ht 5\' 6"  (1.676 m)   Wt 242 lb (109.8 kg)   SpO2 98%   BMI 39.06 kg/m    Physical Exam:  General- No distress,  A&Ox3, pleasant ENT: No sinus tenderness, TM clear, pale nasal mucosa, no oral exudate,no post nasal drip, no LAN Cardiac: S1, S2, regular rate and rhythm, no murmur Chest: No wheeze/ rales/ dullness; no accessory muscle use, no nasal flaring, no sternal retractions Abd.: Soft Non-tender, ND, obese Ext: No clubbing cyanosis, edema Neuro:  normal strength, A&O x 3, MAE x 4 Skin: No rashes,  warm and dry Psych: normal  mood and behavior   Assessment/Plan  Simple chronic bronchitis (HCC) Resolved flare Plan: Claritin 10 mg once daily for allergies.( We will send in a prescription) Restart your Mucinex. We will give you some samples. We will schedule you for a home sleep study now that you are better. If it shows sleep apnea, we will try the nasal pillows with CPAP. We will call you with results. Continue Flovent two puffs bid - albuterol prn - singulair 10 mg qhs - she is to continue flonase and claritin - Continue Mucinex Remember, sips of water instead of throat clearing Sugar free jolly ranchers or Werther's hard candies for throat soothing Avoid mint and menthol, chocolate  and caffeine. Follow up re: Pulmonary Artery Hypertension with your cardiologist in [place] at your July appointment. Follow up in 8 weeks with Maralyn SagoSarah or Dr. Craige CottaSood Please contact office for sooner follow up if symptoms do not improve or worsen or seek emergency care    OSA (obstructive sleep apnea) Suspected OSA Plan: Home Sleep Study Has agreed to trying nasal pillows if she is + for OSA    Bevelyn NgoSarah F Groce, NP 06/10/2017  2:27 PM

## 2017-06-10 NOTE — Patient Instructions (Addendum)
It is good to see you today. Claritin 10 mg once daily for allergies.( We will send in a prescription) Restart your Mucinex. We will give you some samples. We will schedule you for a home sleep study now that you are better. If it shows sleep apnea, we will try the nasal pillows with CPAP. We will call you with results. Continue Flovent two puffs bid - albuterol prn - singulair 10 mg qhs - she is to continue flonase and claritin - Continue Mucinex Remember, sips of water instead of throat clearing Sugar free jolly ranchers or Werther's hard candies for throat soothing Avoid mint and menthol, chocolate  and caffeine. Follow up re: Pulmonary Artery Hypertension with your cardiologist in [place] at your July appointment. Follow up in 8 weeks with Maralyn SagoSarah or Dr. Craige CottaSood Please contact office for sooner follow up if symptoms do not improve or worsen or seek emergency care

## 2017-06-10 NOTE — Assessment & Plan Note (Signed)
Resolved flare Plan: Claritin 10 mg once daily for allergies.( We will send in a prescription) Restart your Mucinex. We will give you some samples. We will schedule you for a home sleep study now that you are better. If it shows sleep apnea, we will try the nasal pillows with CPAP. We will call you with results. Continue Flovent two puffs bid - albuterol prn - singulair 10 mg qhs - she is to continue flonase and claritin - Continue Mucinex Remember, sips of water instead of throat clearing Sugar free jolly ranchers or Werther's hard candies for throat soothing Avoid mint and menthol, chocolate  and caffeine. Follow up re: Pulmonary Artery Hypertension with your cardiologist in [place] at your July appointment. Follow up in 8 weeks with Gail Olson or Gail Olson Please contact office for sooner follow up if symptoms do not improve or worsen or seek emergency care

## 2017-06-11 ENCOUNTER — Other Ambulatory Visit: Payer: Self-pay | Admitting: *Deleted

## 2017-06-11 DIAGNOSIS — G4733 Obstructive sleep apnea (adult) (pediatric): Secondary | ICD-10-CM

## 2017-06-15 ENCOUNTER — Telehealth: Payer: Self-pay | Admitting: Pulmonary Disease

## 2017-06-15 NOTE — Telephone Encounter (Signed)
HST 06/11/17 >> AHI 19.5, SaO2 low 81%.   Will have my nurse inform pt that sleep study shows moderate sleep apnea.  Options are 1) CPAP now, 2) ROV first.  If She is agreeable to CPAP, then please send order for auto CPAP range 5 to 15 cm H2O with heated humidity and mask of choice.  Have download sent 1 month after starting CPAP and set up ROV 2 months after starting CPAP.  ROV can be with me or NP.

## 2017-06-16 NOTE — Telephone Encounter (Signed)
ATC patient. VM was not setup. Will attempt to call back later.

## 2017-06-22 ENCOUNTER — Other Ambulatory Visit: Payer: Self-pay | Admitting: Pulmonary Disease

## 2017-06-22 DIAGNOSIS — G4733 Obstructive sleep apnea (adult) (pediatric): Secondary | ICD-10-CM

## 2017-06-22 NOTE — Telephone Encounter (Signed)
Spoke with Patient about CPAP recommendations.  Patient stated agreement to start the CPAP and that she would call when she starts her CPAP for follow up appointment.  DME order for CPAP placed. Nothing further needed at this time.

## 2017-06-23 ENCOUNTER — Telehealth: Payer: Self-pay | Admitting: Pulmonary Disease

## 2017-06-23 DIAGNOSIS — G4733 Obstructive sleep apnea (adult) (pediatric): Secondary | ICD-10-CM

## 2017-06-23 NOTE — Telephone Encounter (Signed)
HST 06/11/17 >> AHI 17, SpO2 low 81%   Will have my nurse inform pt that sleep study shows moderate sleep apnea.  Options are 1) CPAP now, 2) ROV first.  If She is agreeable to CPAP, then please send order for auto CPAP range 5 to 15 cm H2O with heated humidity and mask of choice.  Have download sent 1 month after starting CPAP and set up ROV 2 months after starting CPAP.  ROV can be with me or NP.

## 2017-06-24 NOTE — Telephone Encounter (Signed)
Please see 06/15/17 phone note. Will close encounter.

## 2017-06-29 DIAGNOSIS — I824Z2 Acute embolism and thrombosis of unspecified deep veins of left distal lower extremity: Secondary | ICD-10-CM | POA: Diagnosis not present

## 2017-06-29 DIAGNOSIS — I10 Essential (primary) hypertension: Secondary | ICD-10-CM | POA: Diagnosis not present

## 2017-06-29 DIAGNOSIS — E559 Vitamin D deficiency, unspecified: Secondary | ICD-10-CM | POA: Diagnosis not present

## 2017-06-29 DIAGNOSIS — I2699 Other pulmonary embolism without acute cor pulmonale: Secondary | ICD-10-CM | POA: Diagnosis not present

## 2017-06-29 DIAGNOSIS — R7301 Impaired fasting glucose: Secondary | ICD-10-CM | POA: Diagnosis not present

## 2017-06-29 DIAGNOSIS — E039 Hypothyroidism, unspecified: Secondary | ICD-10-CM | POA: Diagnosis not present

## 2017-06-29 DIAGNOSIS — N390 Urinary tract infection, site not specified: Secondary | ICD-10-CM | POA: Diagnosis not present

## 2017-07-16 ENCOUNTER — Telehealth: Payer: Self-pay | Admitting: Acute Care

## 2017-07-16 NOTE — Telephone Encounter (Signed)
Spoke with Gail Olson, they have the order and they will check with [employer] in the morning and contact pt.   Called pt to let her know and nothing else is needed.

## 2017-07-22 DIAGNOSIS — I2782 Chronic pulmonary embolism: Secondary | ICD-10-CM | POA: Diagnosis not present

## 2017-07-22 DIAGNOSIS — J42 Unspecified chronic bronchitis: Secondary | ICD-10-CM | POA: Diagnosis not present

## 2017-07-22 DIAGNOSIS — J410 Simple chronic bronchitis: Secondary | ICD-10-CM | POA: Diagnosis not present

## 2017-07-22 DIAGNOSIS — G4733 Obstructive sleep apnea (adult) (pediatric): Secondary | ICD-10-CM | POA: Diagnosis not present

## 2017-07-31 ENCOUNTER — Other Ambulatory Visit: Payer: Self-pay | Admitting: Acute Care

## 2017-08-05 DIAGNOSIS — G4733 Obstructive sleep apnea (adult) (pediatric): Secondary | ICD-10-CM | POA: Diagnosis not present

## 2017-08-11 ENCOUNTER — Other Ambulatory Visit: Payer: Self-pay | Admitting: Pulmonary Disease

## 2017-08-22 DIAGNOSIS — G4733 Obstructive sleep apnea (adult) (pediatric): Secondary | ICD-10-CM | POA: Diagnosis not present

## 2017-08-22 DIAGNOSIS — J42 Unspecified chronic bronchitis: Secondary | ICD-10-CM | POA: Diagnosis not present

## 2017-08-22 DIAGNOSIS — I2782 Chronic pulmonary embolism: Secondary | ICD-10-CM | POA: Diagnosis not present

## 2017-08-22 DIAGNOSIS — J410 Simple chronic bronchitis: Secondary | ICD-10-CM | POA: Diagnosis not present

## 2017-08-24 ENCOUNTER — Ambulatory Visit (HOSPITAL_BASED_OUTPATIENT_CLINIC_OR_DEPARTMENT_OTHER)
Admission: RE | Admit: 2017-08-24 | Discharge: 2017-08-24 | Disposition: A | Discharge status: Home / Self Care | Payer: PPO | Source: Ambulatory Visit | Attending: Cardiology | Admitting: Cardiology

## 2017-08-24 ENCOUNTER — Telehealth: Payer: Self-pay | Admitting: Cardiology

## 2017-08-24 DIAGNOSIS — M79604 Pain in right leg: Secondary | ICD-10-CM

## 2017-08-24 DIAGNOSIS — M79661 Pain in right lower leg: Secondary | ICD-10-CM | POA: Diagnosis not present

## 2017-08-24 DIAGNOSIS — Z86718 Personal history of other venous thrombosis and embolism: Secondary | ICD-10-CM | POA: Diagnosis not present

## 2017-08-24 NOTE — Telephone Encounter (Signed)
Patient states that she has been having consistent pain at rest and with movement in her right leg since yesterday afternoon around 3:00. Before yesterday, she reports having a sharp pain that would come and go once in a while for the past week. Patient denies swelling of right leg. Patient has a history of left leg DVT and PE in December 2018. EMS was called on Friday evening when patient left like she was going to pass out. Her blood pressure at that time was 190/110 and she states she had a "burning sensation across her chest." EMS came to the conclusion that the patient was having a panic attack. Patient feels better at this time but she is just very nervous about her leg hurting due to her history of blood clots. Will have Dr. Bing MatterKrasowski advise.

## 2017-08-24 NOTE — Telephone Encounter (Signed)
Dr. Bing MatterKrasowski advised that lower extremity ultrasound should be done to rule out DVT as soon as possible. Lower extremity ultrasound scheduled for today, 08/24/17 at 11:30. Patient agreeable. Instructions provided on where to go for ultrasound. Patient verbalized understanding. No further questions.

## 2017-08-24 NOTE — Telephone Encounter (Signed)
Had a really bad panic attack Friday night and is worried because she's on blood thinner and is having pain in her right leg and thinks it could be a blood clot

## 2017-09-21 DIAGNOSIS — I2782 Chronic pulmonary embolism: Secondary | ICD-10-CM | POA: Diagnosis not present

## 2017-09-21 DIAGNOSIS — J410 Simple chronic bronchitis: Secondary | ICD-10-CM | POA: Diagnosis not present

## 2017-09-21 DIAGNOSIS — J42 Unspecified chronic bronchitis: Secondary | ICD-10-CM | POA: Diagnosis not present

## 2017-09-21 DIAGNOSIS — G4733 Obstructive sleep apnea (adult) (pediatric): Secondary | ICD-10-CM | POA: Diagnosis not present

## 2017-09-23 ENCOUNTER — Ambulatory Visit (INDEPENDENT_AMBULATORY_CARE_PROVIDER_SITE_OTHER)
Admission: RE | Admit: 2017-09-23 | Discharge: 2017-09-23 | Disposition: A | Discharge status: Home / Self Care | Payer: PPO | Source: Ambulatory Visit | Attending: Pulmonary Disease | Admitting: Pulmonary Disease

## 2017-09-23 ENCOUNTER — Encounter: Payer: Self-pay | Admitting: Pulmonary Disease

## 2017-09-23 ENCOUNTER — Ambulatory Visit (INDEPENDENT_AMBULATORY_CARE_PROVIDER_SITE_OTHER): Payer: PPO | Admitting: Pulmonary Disease

## 2017-09-23 VITALS — BP 138/78 | HR 100 | Ht 66.0 in | Wt 242.0 lb

## 2017-09-23 DIAGNOSIS — R0609 Other forms of dyspnea: Secondary | ICD-10-CM

## 2017-09-23 DIAGNOSIS — G4733 Obstructive sleep apnea (adult) (pediatric): Secondary | ICD-10-CM

## 2017-09-23 DIAGNOSIS — B370 Candidal stomatitis: Secondary | ICD-10-CM

## 2017-09-23 DIAGNOSIS — J42 Unspecified chronic bronchitis: Secondary | ICD-10-CM

## 2017-09-23 DIAGNOSIS — I2782 Chronic pulmonary embolism: Secondary | ICD-10-CM

## 2017-09-23 DIAGNOSIS — J9811 Atelectasis: Secondary | ICD-10-CM | POA: Diagnosis not present

## 2017-09-23 MED ORDER — NYSTATIN 100000 UNIT/ML MT SUSP: 500000.0000 [IU] | Freq: Four times a day (QID) | OROMUCOSAL | 0 refills | Status: DC

## 2017-09-23 MED ORDER — AEROCHAMBER MV MISC: 0 refills | Status: DC

## 2017-09-23 NOTE — Patient Instructions (Signed)
Chest xray today  Will arrange for spacer device for inhalers  Nystatin 5 ml swish and swallow 4 times daily for 5 days  Will call with result of CPAP report  Follow up in 6 months

## 2017-09-23 NOTE — Progress Notes (Signed)
[facility]  Chief Complaint  Patient presents with  . Follow-up    pt reports of sob with exertion, sinus/ear pressure & non prod cough. wearing cpap avg 6hr nightly. feels pressure & mask are okay.     Vital signs: BP 138/78 (BP Location: Left Arm, Cuff Size: Normal)   Pulse 100   Ht 5\' 6"  (1.676 m)   Wt 242 lb (109.8 kg) Comment: weight is per pt  SpO2 98%   BMI 39.06 kg/m   History of Present Illness: Gail Olson is a 67 y.o. female with chronic bronchitis, asthma, obstructive sleep apnea, and PE with DVT.  She was doing better until a few weeks ago.  She was wearing CPAP.  Water in machine ran out.  She woke up feeling dry and short of breath.  She started getting anxious.  Her husband called EMS.  Apparently EMS was arguing with her about what was the cause of her symptoms and told her it was from anxiety.  She never went to the hospital.  Her blood pressure was in the 200's at the time.  She hasn't felt that her breathing got back to baseline since.  She has some sinus congestion and drainage.  Her throat is sore and she will lose her voice if she talks too much.  She isn't having cough, wheeze, or sputum.  She is doing well with CPAP now.  No issues with mask fit.   Comprehensive Respiratory Exam:  Appearance - well kempt  ENMT - nasal mucosa moist, turbinates clear, midline nasal septum, no dental lesions, no gingival bleeding, erythema posterior pharynx with white exudate on posterior pharynx and soft palate, no tonsillar hypertrophy Neck - no masses, trachea midline, no thyromegaly, no elevation in JVP Respiratory - normal appearance of chest wall, normal respiratory effort w/o accessory muscle use, no dullness on percussion, no wheezing or rales CV - s1s2 regular rate and rhythm, no murmurs, no peripheral edema, radial pulses symmetric GI - soft, non tender, no masses Lymph - no adenopathy noted in neck and axillary  areas MSK - normal muscle strength and tone, normal gait Ext - no cyanosis, clubbing, or joint inflammation noted Skin - no rashes, lesions, or ulcers Neuro - oriented to person, place, and time Psych - normal mood and affect   Assessment/Plan:  Chronic bronchitis with allergic asthma. - continue flovent, singulair, prn albuterol  Thrush. - nystatin swish and swallow - spacer for flovent - rinse mouth after using flovent  Obstructive sleep apnea. - she is compliant with CPAP and reports benefit - will call her with results of CPAP download  Dyspnea on exertion. - not certain what happened on day she had to call EMS - will repeat chest xray today  Unprovoked pulmonary embolism with Lt leg DVT. - continue eliquis - she has f/u with cardiology and will discuss repeating Echo then   Patient Instructions  Chest xray today  Will arrange for spacer device for inhalers  Nystatin 5 ml swish and swallow 4 times daily for 5 days  Will call with result of CPAP report  Follow up in 6 months   Coralyn Helling, MD [facility] 09/23/2017, 1:58 PM Pager:  [phone]  Flow Sheet  Pulmonary tests: CT angio chest 02/26/17 >> b/l PE with RV:LV ratio 1.2, ATX, small airway disease CT angio chest 03/19/17 >> decreased size of PE, ATX, small hiatal hernia  Sleep tests: ONO with RA 04/27/17 >> test  time 8 hrs 21 min.  Basal SpO2 92%, low SpO2 83%.  Spent 18 min with SpO2 < 88%. HST 06/11/17 >> AHI 19.5, SaO2 low 81%.  Cardiac tests: Echo 02/27/17 >> EF 60 to 65%, grade 1 DD, PAS 47 mmHg  Past Medical History: She  has a past medical history of Arthritis, DVT (deep venous thrombosis) (HCC), Pulmonary embolism (HCC), and Thyroid disease.  Past Surgical History: She  has a past surgical history that includes Appendectomy; Back surgery; and Cholecystectomy.  Family History: Her family history includes Cancer in her mother; Hypertension in her brother, mother, and  sister.  Social History: She  reports that she has never smoked. She has never used smokeless tobacco. She reports that she does not drink alcohol or use drugs.  Medications: Allergies as of 09/23/2017      Reactions   Nsaids Other (See Comments)   Not suppose to take with her blood thinner   Other Other (See Comments)   Extreme stomach pain if any of these are eaten: spicy foods, onions, fried foods, cauliflower, and broccoli   Prednisone Other (See Comments)   Not suppose to take due to advanced osteoporosis   Risedronate Other (See Comments)   (Actonel) Caused EXTREME JAW PAIN and muscles and joints ached   Adhesive [tape] Rash, Other (See Comments)   EKG pads left welts on the skin   Codeine Palpitations, Other (See Comments)   Tachycardia   Penicillins Rash   Has patient had a PCN reaction causing immediate rash, facial/tongue/throat swelling, SOB or lightheadedness with hypotension: Yes Has patient had a PCN reaction causing severe rash involving mucus membranes or skin necrosis: Unk Has patient had a PCN reaction that required hospitalization: Unk Has patient had a PCN reaction occurring within the last 10 years: Unk If all of the above answers are "NO", then may proceed with Cephalosporin use.      Medication List        Accurate as of 09/23/17  1:58 PM. Always use your most recent med list.          acetaminophen 650 MG CR tablet Commonly known as:  TYLENOL Take 650 mg by mouth every 8 (eight) hours as needed for pain.   PROAIR HFA 108 (90 Base) MCG/ACT inhaler Generic drug:  albuterol Inhale 2 puffs into the lungs See admin instructions. 2 puffs every four to six hours as needed for shortness of breath   albuterol (2.5 MG/3ML) 0.083% nebulizer solution Commonly known as:  PROVENTIL Take 2.5 mg by nebulization every 4 (four) hours as needed for wheezing or shortness of breath (AS DIRECTED BY MD).   apixaban 5 MG Tabs tablet Commonly known as:  ELIQUIS Take 1  tablet (5 mg total) by mouth 2 (two) times daily.   benzonatate 200 MG capsule Commonly known as:  TESSALON Take 1 capsule (200 mg total) by mouth 3 (three) times daily as needed for cough.   betamethasone dipropionate 0.05 % ointment Commonly known as:  DIPROLENE Apply 1 application topically 2 (two) times daily as needed (to affected areas).   fluticasone 110 MCG/ACT inhaler Commonly known as:  FLOVENT HFA Inhale 2 puffs into the lungs 2 (two) times daily.   fluticasone 50 MCG/ACT nasal spray Commonly known as:  FLONASE PLACE 2 SPRAYS INTO BOTH NOSTRILS AT BEDTIME AS NEEDED FOR ALLERGIES OR RHINITIS.   hydrochlorothiazide 12.5 MG capsule Commonly known as:  MICROZIDE Take 12.5 mg by mouth daily as needed for edema or fluid.  HYDROcodone-acetaminophen 5-325 MG tablet Commonly known as:  NORCO/VICODIN Take 1 tablet by mouth every 8 (eight) hours as needed for pain.   HYDROcodone-homatropine 5-1.5 MG/5ML syrup Commonly known as:  HYCODAN TAKE 5 MLS BY MOUTH EVERY 4 (FOUR) HOURS AS NEEDED FOR UP TO 10 DAYS (COUGH).   levothyroxine 75 MCG tablet Commonly known as:  SYNTHROID, LEVOTHROID Take 75 mcg by mouth every Tuesday, Thursday, Saturday, and Sunday.   levothyroxine 50 MCG tablet Commonly known as:  SYNTHROID, LEVOTHROID Take 50 mcg by mouth every Monday, Wednesday, and Friday.   loratadine 10 MG tablet Commonly known as:  CLARITIN Take 1 tablet (10 mg total) by mouth daily.   meclizine 25 MG tablet Commonly known as:  ANTIVERT Take 25 mg by mouth 3 (three) times daily as needed for dizziness.   methocarbamol 750 MG tablet Commonly known as:  ROBAXIN Take 750 mg by mouth 3 (three) times daily as needed for muscle spasms.   metoprolol succinate 25 MG 24 hr tablet Commonly known as:  TOPROL-XL Take 25 mg by mouth at bedtime.   montelukast 10 MG tablet Commonly known as:  SINGULAIR TAKE 1 TABLET BY MOUTH EVERYDAY AT BEDTIME   omeprazole 20 MG capsule Commonly  known as:  PRILOSEC Take 20 mg by mouth daily as needed (for reflux).   OVER THE COUNTER MEDICATION Remefemin tablets: Take 1 tablet by mouth two to three times a week   UNABLE TO FIND Compression stockings, daily

## 2017-09-25 ENCOUNTER — Telehealth: Payer: Self-pay | Admitting: Pulmonary Disease

## 2017-09-25 NOTE — Telephone Encounter (Signed)
Called and spoke with patient regarding results.  Informed the patient of results and recommendations today. Pt verbalized understanding and denied any questions or concerns at this time.  Nothing further needed.  

## 2017-09-25 NOTE — Telephone Encounter (Signed)
Dg Chest 2 View  Result Date: 09/23/2017 CLINICAL DATA:  Dyspnea on exertion. EXAM: CHEST - 2 VIEW COMPARISON:  CT chest 03/18/2017.  Chest two-view 03/18/2017 FINDINGS: Heart size and vascularity normal. Minimal bibasilar atelectasis. Negative for pneumonia or effusion Cement vertebroplasty for and chronic fractures T11 and T12. Chronic fractures T9 and T10 unchanged from the prior CT. IMPRESSION: Mild bibasilar atelectasis.  Chronic thoracic fractures. Electronically Signed   By: Marlan Palauharles  Clark M.D.   On: 09/23/2017 15:54    Please let her know her chest xray looked okay.  No worrisome findings.  No change to current tx plan.

## 2017-09-25 NOTE — Telephone Encounter (Signed)
rec'd fax from [facility] pt's cpap DL Placed in VS green to do folder for review

## 2017-09-30 NOTE — Telephone Encounter (Signed)
Patient returned phone call. °

## 2017-09-30 NOTE — Telephone Encounter (Signed)
Auto CPAP 08/25/17 to 09/23/17 >> used on 30 of 30 nights with average 6 hrs 33 min.  Average AHI 1.6 with median CPAP 7 and 95 th percentile CPAP 10 cm H2O.   Please let her know CPAP report shows good control of sleep apnea with current settings.

## 2017-09-30 NOTE — Telephone Encounter (Signed)
Attempted to call patient today regarding results. I did not receive an answer at time of call. I have left a voicemail message for pt to return call. X1  

## 2017-09-30 NOTE — Telephone Encounter (Signed)
Spoke with patient. She is aware of results.   Nothing further needed at time of call.  

## 2017-10-05 ENCOUNTER — Encounter: Payer: Self-pay | Admitting: Cardiology

## 2017-10-05 ENCOUNTER — Ambulatory Visit (INDEPENDENT_AMBULATORY_CARE_PROVIDER_SITE_OTHER): Payer: PPO | Admitting: Cardiology

## 2017-10-05 VITALS — BP 120/90 | HR 78 | Resp 16 | Ht 66.0 in | Wt 247.8 lb

## 2017-10-05 DIAGNOSIS — G4733 Obstructive sleep apnea (adult) (pediatric): Secondary | ICD-10-CM

## 2017-10-05 DIAGNOSIS — R0789 Other chest pain: Secondary | ICD-10-CM

## 2017-10-05 DIAGNOSIS — R002 Palpitations: Secondary | ICD-10-CM

## 2017-10-05 DIAGNOSIS — R0600 Dyspnea, unspecified: Secondary | ICD-10-CM

## 2017-10-05 DIAGNOSIS — I2721 Secondary pulmonary arterial hypertension: Secondary | ICD-10-CM

## 2017-10-05 DIAGNOSIS — R0689 Other abnormalities of breathing: Secondary | ICD-10-CM

## 2017-10-05 NOTE — Patient Instructions (Signed)
Medication Instructions:  Your physician recommends that you continue on your current medications as directed. Please refer to the Current Medication list given to you today.   Labwork: None.  Testing/Procedures: Your physician has requested that you have an echocardiogram. Echocardiography is a painless test that uses sound waves to create images of your heart. It provides your doctor with information about the size and shape of your heart and how well your heart's chambers and valves are working. This procedure takes approximately one hour. There are no restrictions for this procedure.    Follow-Up: Your physician recommends that you schedule a follow-up appointment in: 2 months.    Any Other Special Instructions Will Be Listed Below (If Applicable).      If you need a refill on your cardiac medications before your next appointment, please call your pharmacy.   Echocardiogram An echocardiogram, or echocardiography, uses sound waves (ultrasound) to produce an image of your heart. The echocardiogram is simple, painless, obtained within a short period of time, and offers valuable information to your health care provider. The images from an echocardiogram can provide information such as:  Evidence of coronary artery disease (CAD).  Heart size.  Heart muscle function.  Heart valve function.  Aneurysm detection.  Evidence of a past heart attack.  Fluid buildup around the heart.  Heart muscle thickening.  Assess heart valve function.  Tell a health care provider about:  Any allergies you have.  All medicines you are taking, including vitamins, herbs, eye drops, creams, and over-the-counter medicines.  Any problems you or family members have had with anesthetic medicines.  Any blood disorders you have.  Any surgeries you have had.  Any medical conditions you have.  Whether you are pregnant or may be pregnant. What happens before the procedure? No special  preparation is needed. Eat and drink normally. What happens during the procedure?  In order to produce an image of your heart, gel will be applied to your chest and a wand-like tool (transducer) will be moved over your chest. The gel will help transmit the sound waves from the transducer. The sound waves will harmlessly bounce off your heart to allow the heart images to be captured in real-time motion. These images will then be recorded.  You may need an IV to receive a medicine that improves the quality of the pictures. What happens after the procedure? You may return to your normal schedule including diet, activities, and medicines, unless your health care provider tells you otherwise. This information is not intended to replace advice given to you by your health care provider. Make sure you discuss any questions you have with your health care provider. Document Released: 02/15/2000 Document Revised: 10/06/2015 Document Reviewed: 10/25/2012 Elsevier Interactive Patient Education  2017 [employer] Inc[employer]    

## 2017-10-05 NOTE — Progress Notes (Signed)
Cardiology Office Note:    Date:  10/05/2017   ID:  Gail Olson, DOB 1950/12/24, MRN [id number]  PCP:  Shellia Cleverly, PA  Cardiologist:  Gypsy Balsam, MD    Referring MD: Shellia Cleverly, [place]   Chief Complaint  Patient presents with  . Follow-up  . Shortness of Breath    on exertion, and with lying down   I had episode of shortness of breath  History of Present Illness:       Gail Olson is a 67 y.o. female she came to my office today for follow-up, described to have episode of shortness of breath that happened one night.  Apparently she started having some issue with her CPAP machine it ran out of water water leak out of it and then she started having some shortness of breath she eventually ended up calling 911 and EMS came it was very unpleasant experience for her she was told that she had anxiety attack and there is no point for her to go to the emergency room.  Her blood pressure was very high and she was very upset at the end of the visit and actually asked EMS team to leave.  Since that time she is complaining of having some dizziness as well as some more shortness of breath but she started exercising back on the regular basis with some difficulties however.  Past Medical History:  Diagnosis Date  . Arthritis   . DVT (deep venous thrombosis) (HCC)   . Pulmonary embolism (HCC)   . Thyroid disease     Past Surgical History:  Procedure Laterality Date  . APPENDECTOMY    . BACK SURGERY    . CHOLECYSTECTOMY    . OVARY SURGERY      Current Medications: Current Meds  Medication Sig  . acetaminophen (TYLENOL) 650 MG CR tablet Take 650 mg by mouth every 8 (eight) hours as needed for pain.  Marland Kitchen albuterol (PROAIR HFA) 108 (90 Base) MCG/ACT inhaler Inhale 2 puffs into the lungs See admin instructions. 2 puffs every four to six hours as needed for shortness of breath  . albuterol (PROVENTIL) (2.5 MG/3ML) 0.083% nebulizer solution Take 2.5 mg by nebulization every 4  (four) hours as needed for wheezing or shortness of breath (AS DIRECTED BY MD).   Marland Kitchen apixaban (ELIQUIS) 5 MG TABS tablet Take 1 tablet (5 mg total) by mouth 2 (two) times daily.  . benzonatate (TESSALON) 200 MG capsule Take 1 capsule (200 mg total) by mouth 3 (three) times daily as needed for cough.  . betamethasone dipropionate (DIPROLENE) 0.05 % ointment Apply 1 application topically 2 (two) times daily as needed (to affected areas).   . Black Cohosh 20 MG TABS Take 20 mg by mouth 3 (three) times a week.  . fluticasone (FLONASE) 50 MCG/ACT nasal spray PLACE 2 SPRAYS INTO BOTH NOSTRILS AT BEDTIME AS NEEDED FOR ALLERGIES OR RHINITIS.  . fluticasone (FLOVENT HFA) 110 MCG/ACT inhaler Inhale 2 puffs into the lungs 2 (two) times daily.  . hydrochlorothiazide (MICROZIDE) 12.5 MG capsule Take 12.5 mg by mouth daily as needed for edema or fluid.  Marland Kitchen HYDROcodone-acetaminophen (NORCO/VICODIN) 5-325 MG tablet Take 1 tablet by mouth every 8 (eight) hours as needed for pain.  Marland Kitchen levothyroxine (SYNTHROID, LEVOTHROID) 50 MCG tablet Take 50 mcg by mouth every Monday, Wednesday, and Friday.  . levothyroxine (SYNTHROID, LEVOTHROID) 75 MCG tablet Take 75 mcg by mouth every Tuesday, Thursday, Saturday, and Sunday.  . loratadine (CLARITIN) 10 MG  tablet Take 1 tablet (10 mg total) by mouth daily.  . meclizine (ANTIVERT) 25 MG tablet Take 25 mg by mouth 3 (three) times daily as needed for dizziness.   . methocarbamol (ROBAXIN) 750 MG tablet Take 750 mg by mouth 3 (three) times daily as needed for muscle spasms.   . metoprolol succinate (TOPROL-XL) 25 MG 24 hr tablet Take 25 mg by mouth at bedtime.   . montelukast (SINGULAIR) 10 MG tablet TAKE 1 TABLET BY MOUTH EVERYDAY AT BEDTIME  . nystatin (MYCOSTATIN) 100000 UNIT/ML suspension Take 5 mLs (500,000 Units total) by mouth 4 (four) times daily.  Marland Kitchen omeprazole (PRILOSEC) 20 MG capsule Take 20 mg by mouth daily as needed (for reflux).   Marland Kitchen OVER THE COUNTER MEDICATION Remefemin  tablets: Take 1 tablet by mouth two to three times a week  . Spacer/Aero-Holding Chambers (AEROCHAMBER MV) inhaler Use as instructed  . UNABLE TO FIND Compression stockings, daily  . Vitamin D, Ergocalciferol, (DRISDOL) 50000 units CAPS capsule Take 50,000 Units by mouth once a week.     Allergies:   Nsaids; Other; Prednisone; Risedronate; Adhesive [tape]; Codeine; and Penicillins   Social History   Socioeconomic History  . Marital status: Married    Spouse name: Not on file  . Number of children: Not on file  . Years of education: Not on file  . Highest education level: Not on file  Occupational History  . Not on file  Social Needs  . Financial resource strain: Not on file  . Food insecurity:    Worry: Not on file    Inability: Not on file  . Transportation needs:    Medical: Not on file    Non-medical: Not on file  Tobacco Use  . Smoking status: Never Smoker  . Smokeless tobacco: Never Used  Substance and Sexual Activity  . Alcohol use: No    Frequency: Never  . Drug use: No  . Sexual activity: Not on file  Lifestyle  . Physical activity:    Days per week: Not on file    Minutes per session: Not on file  . Stress: Not on file  Relationships  . Social connections:    Talks on phone: Not on file    Gets together: Not on file    Attends religious service: Not on file    Active member of club or organization: Not on file    Attends meetings of clubs or organizations: Not on file    Relationship status: Not on file  Other Topics Concern  . Not on file  Social History Narrative  . Not on file     Family History: The patient's family history includes Cancer in her mother; Hypertension in her brother, mother, and sister. There is no history of Pulmonary disease. ROS:   Please see the history of present illness.    All 14 point review of systems negative except as described per history of present illness  EKGs/Labs/Other Studies Reviewed:      Recent  Labs: 02/26/2017: B Natriuretic Peptide 13.9 03/19/2017: BUN 11; Creatinine, Ser 0.77; Potassium 3.8; Sodium 140 05/21/2017: Hemoglobin 13.9; Platelets 249.0  Recent Lipid Panel No results found for: CHOL, TRIG, HDL, CHOLHDL, VLDL, LDLCALC, LDLDIRECT  Physical Exam:    VS:  BP 120/90   Pulse 78   Resp 16   Ht 5\' 6"  (1.676 m)   Wt 247 lb 12.8 oz (112.4 kg)   SpO2 97%   BMI 40.00 kg/m  Wt Readings from Last 3 Encounters:  10/05/17 247 lb 12.8 oz (112.4 kg)  09/23/17 242 lb (109.8 kg)  06/10/17 242 lb (109.8 kg)     GEN:  Well nourished, well developed in no acute distress HEENT: Normal NECK: No JVD; No carotid bruits LYMPHATICS: No lymphadenopathy CARDIAC: RRR, no murmurs, no rubs, no gallops RESPIRATORY:  Clear to auscultation without rales, wheezing or rhonchi  ABDOMEN: Soft, non-tender, non-distended MUSCULOSKELETAL:  No edema; No deformity  SKIN: Warm and dry LOWER EXTREMITIES: no swelling NEUROLOGIC:  Alert and oriented x 3 PSYCHIATRIC:  Normal affect   ASSESSMENT:    1. Pulmonary artery hypertension (HCC)   2. OSA (obstructive sleep apnea)   3. Atypical chest pain   4. Dyspnea and respiratory abnormalities   5. Palpitations    PLAN:    In order of problems listed above:  1. Pulmonary artery hypertension time to recheck echocardiogram which I will order her to have especially in view of the fact that she is complaining of having some shortness of breath. 2. Obstructive sleep apnea to be managed by pulmonary team.  He is doing better from that point review. 3. Atypical chest pain denies having any lately. 4. Dyspnea on exertion again echocardiogram will be done to check pulmonary artery pressure 5. Palpitations: Denies having any.   Medication Adjustments/Labs and Tests Ordered: Current medicines are reviewed at length with the patient today.  Concerns regarding medicines are outlined above.  No orders of the defined types were placed in this  encounter.  Medication changes: No orders of the defined types were placed in this encounter.   Signed, Georgeanna Leaobert J. Nicolemarie Wooley, MD, [facility] 10/05/2017 11:57 AM    [facility]

## 2017-10-07 ENCOUNTER — Emergency Department (HOSPITAL_BASED_OUTPATIENT_CLINIC_OR_DEPARTMENT_OTHER): Payer: PPO

## 2017-10-07 ENCOUNTER — Encounter (HOSPITAL_BASED_OUTPATIENT_CLINIC_OR_DEPARTMENT_OTHER): Payer: Self-pay | Admitting: *Deleted

## 2017-10-07 ENCOUNTER — Other Ambulatory Visit: Payer: Self-pay

## 2017-10-07 ENCOUNTER — Observation Stay (HOSPITAL_BASED_OUTPATIENT_CLINIC_OR_DEPARTMENT_OTHER)
Admission: EM | Admit: 2017-10-07 | Discharge: 2017-10-10 | Disposition: A | Discharge status: Home / Self Care | Payer: PPO | Attending: Internal Medicine | Admitting: Internal Medicine

## 2017-10-07 DIAGNOSIS — Z7951 Long term (current) use of inhaled steroids: Secondary | ICD-10-CM | POA: Diagnosis not present

## 2017-10-07 DIAGNOSIS — R072 Precordial pain: Principal | ICD-10-CM | POA: Insufficient documentation

## 2017-10-07 DIAGNOSIS — J45909 Unspecified asthma, uncomplicated: Secondary | ICD-10-CM | POA: Diagnosis not present

## 2017-10-07 DIAGNOSIS — I2721 Secondary pulmonary arterial hypertension: Secondary | ICD-10-CM | POA: Diagnosis not present

## 2017-10-07 DIAGNOSIS — Z79890 Hormone replacement therapy (postmenopausal): Secondary | ICD-10-CM | POA: Insufficient documentation

## 2017-10-07 DIAGNOSIS — Z8249 Family history of ischemic heart disease and other diseases of the circulatory system: Secondary | ICD-10-CM | POA: Diagnosis not present

## 2017-10-07 DIAGNOSIS — K219 Gastro-esophageal reflux disease without esophagitis: Secondary | ICD-10-CM | POA: Diagnosis present

## 2017-10-07 DIAGNOSIS — Z79891 Long term (current) use of opiate analgesic: Secondary | ICD-10-CM | POA: Diagnosis not present

## 2017-10-07 DIAGNOSIS — G4733 Obstructive sleep apnea (adult) (pediatric): Secondary | ICD-10-CM | POA: Diagnosis not present

## 2017-10-07 DIAGNOSIS — E039 Hypothyroidism, unspecified: Secondary | ICD-10-CM | POA: Insufficient documentation

## 2017-10-07 DIAGNOSIS — R0600 Dyspnea, unspecified: Secondary | ICD-10-CM | POA: Insufficient documentation

## 2017-10-07 DIAGNOSIS — I272 Pulmonary hypertension, unspecified: Secondary | ICD-10-CM | POA: Insufficient documentation

## 2017-10-07 DIAGNOSIS — I2782 Chronic pulmonary embolism: Secondary | ICD-10-CM | POA: Diagnosis not present

## 2017-10-07 DIAGNOSIS — E669 Obesity, unspecified: Secondary | ICD-10-CM | POA: Diagnosis not present

## 2017-10-07 DIAGNOSIS — R0609 Other forms of dyspnea: Secondary | ICD-10-CM

## 2017-10-07 DIAGNOSIS — R000 Tachycardia, unspecified: Secondary | ICD-10-CM | POA: Diagnosis not present

## 2017-10-07 DIAGNOSIS — R0789 Other chest pain: Secondary | ICD-10-CM | POA: Diagnosis not present

## 2017-10-07 DIAGNOSIS — R0689 Other abnormalities of breathing: Secondary | ICD-10-CM | POA: Diagnosis not present

## 2017-10-07 DIAGNOSIS — R079 Chest pain, unspecified: Secondary | ICD-10-CM | POA: Diagnosis not present

## 2017-10-07 DIAGNOSIS — Z86711 Personal history of pulmonary embolism: Secondary | ICD-10-CM | POA: Insufficient documentation

## 2017-10-07 DIAGNOSIS — Z7901 Long term (current) use of anticoagulants: Secondary | ICD-10-CM | POA: Insufficient documentation

## 2017-10-07 DIAGNOSIS — Z6833 Body mass index (BMI) 33.0-33.9, adult: Secondary | ICD-10-CM | POA: Insufficient documentation

## 2017-10-07 DIAGNOSIS — Z79899 Other long term (current) drug therapy: Secondary | ICD-10-CM | POA: Diagnosis not present

## 2017-10-07 DIAGNOSIS — Z86718 Personal history of other venous thrombosis and embolism: Secondary | ICD-10-CM | POA: Diagnosis not present

## 2017-10-07 DIAGNOSIS — I2699 Other pulmonary embolism without acute cor pulmonale: Secondary | ICD-10-CM | POA: Diagnosis not present

## 2017-10-07 DIAGNOSIS — E559 Vitamin D deficiency, unspecified: Secondary | ICD-10-CM | POA: Diagnosis not present

## 2017-10-07 DIAGNOSIS — R0602 Shortness of breath: Secondary | ICD-10-CM | POA: Diagnosis not present

## 2017-10-07 HISTORY — DX: Precordial pain: R07.2

## 2017-10-07 LAB — CBC WITH DIFFERENTIAL/PLATELET
Basophils Absolute: 0 10*3/uL (ref 0.0–0.1)
Basophils Relative: 0 %
Eosinophils Absolute: 0.1 10*3/uL (ref 0.0–0.7)
Eosinophils Relative: 2 %
HCT: 43.8 % (ref 36.0–46.0)
Hemoglobin: 14.7 g/dL (ref 12.0–15.0)
Lymphocytes Relative: 31 %
Lymphs Abs: 2.3 10*3/uL (ref 0.7–4.0)
MCH: 30 pg (ref 26.0–34.0)
MCHC: 33.6 g/dL (ref 30.0–36.0)
MCV: 89.4 fL (ref 78.0–100.0)
Monocytes Absolute: 0.9 10*3/uL (ref 0.1–1.0)
Monocytes Relative: 12 %
Neutro Abs: 4 10*3/uL (ref 1.7–7.7)
Neutrophils Relative %: 55 %
Platelets: 218 10*3/uL (ref 150–400)
RBC: 4.9 MIL/uL (ref 3.87–5.11)
RDW: 13.5 % (ref 11.5–15.5)
WBC: 7.2 10*3/uL (ref 4.0–10.5)

## 2017-10-07 LAB — I-STAT VENOUS BLOOD GAS, ED
Acid-Base Excess: 1 mmol/L (ref 0.0–2.0)
Bicarbonate: 26.7 mmol/L (ref 20.0–28.0)
O2 Saturation: 50 %
PCO2 VEN: 45.5 mmHg (ref 44.0–60.0)
PH VEN: 7.376 (ref 7.250–7.430)
TCO2: 28 mmol/L (ref 22–32)
pO2, Ven: 28 mmHg — CL (ref 32.0–45.0)

## 2017-10-07 LAB — COMPREHENSIVE METABOLIC PANEL
ALK PHOS: 69 U/L (ref 38–126)
ALT: 23 U/L (ref 0–44)
AST: 26 U/L (ref 15–41)
Albumin: 3.8 g/dL (ref 3.5–5.0)
Anion gap: 8 (ref 5–15)
BUN: 15 mg/dL (ref 8–23)
CALCIUM: 8.7 mg/dL — AB (ref 8.9–10.3)
CO2: 25 mmol/L (ref 22–32)
CREATININE: 0.93 mg/dL (ref 0.44–1.00)
Chloride: 105 mmol/L (ref 98–111)
Glucose, Bld: 158 mg/dL — ABNORMAL HIGH (ref 70–99)
Potassium: 3.6 mmol/L (ref 3.5–5.1)
SODIUM: 138 mmol/L (ref 135–145)
Total Bilirubin: 0.7 mg/dL (ref 0.3–1.2)
Total Protein: 7.2 g/dL (ref 6.5–8.1)

## 2017-10-07 LAB — TROPONIN I: Troponin I: <0.03 ng/mL (ref <0.03)

## 2017-10-07 LAB — TSH: TSH: 3.178 u[IU]/mL (ref 0.350–4.500)

## 2017-10-07 LAB — BRAIN NATRIURETIC PEPTIDE: B Natriuretic Peptide: 19 pg/mL (ref 0.0–100.0)

## 2017-10-07 MED ORDER — IPRATROPIUM-ALBUTEROL 0.5-2.5 (3) MG/3ML IN SOLN
3.0000 mL | Freq: Four times a day (QID) | RESPIRATORY_TRACT | Status: DC
  Administered 2017-10-07: 3 mL via RESPIRATORY_TRACT
  Filled 2017-10-07: qty 3

## 2017-10-07 MED ORDER — HYDROCODONE-ACETAMINOPHEN 5-325 MG PO TABS
1.0000 | ORAL_TABLET | Freq: Once | ORAL | Status: AC
  Administered 2017-10-07: 1 via ORAL
  Filled 2017-10-07: qty 1

## 2017-10-07 MED ORDER — LORAZEPAM 2 MG/ML IJ SOLN
1.0000 mg | Freq: Once | INTRAMUSCULAR | Status: AC
  Administered 2017-10-07: 1 mg via INTRAVENOUS
  Filled 2017-10-07: qty 1

## 2017-10-07 MED ORDER — IOPAMIDOL (ISOVUE-370) INJECTION 76%
100.0000 mL | Freq: Once | INTRAVENOUS | Status: AC | PRN
  Administered 2017-10-07: 100 mL via INTRAVENOUS

## 2017-10-07 MED ORDER — GI COCKTAIL ~~LOC~~
30.0000 mL | Freq: Once | ORAL | Status: AC
  Administered 2017-10-07: 30 mL via ORAL
  Filled 2017-10-07: qty 30

## 2017-10-07 NOTE — H&P (Signed)
Gail Olson:[id number] DOB: 09/01/1950 DOA: 10/07/2017     PCP: Shellia Cleverly, PA   Outpatient Specialists:   CARDS:  Dr. Bing Matter   Pulmonary   Dr. Craige Cotta   Patient arrived to ER on 10/07/17 at 1455  Patient coming from:  home Lives   With family    Chief Complaint:  Chief Complaint  Patient presents with  . Weakness    HPI: Gail Olson is a 67 y.o. female with medical history significant of chronic bronchitis, asthma, obstructive sleep apnea, and PE with DVT.    Presented with   2 hours of chest pain and generalized fatigueScribes chest pain is burning and associated dyspnea and lightheadedness patient does have history of an anxiety and also history of PE has been taking her other cause. Symptoms started after eating lunch she was sitting down and that occurred at rest somewhat on the right side. No fevers or chills no cough no nausea no vomiting no diarrhea often seems to make it better or worse On arrival to emergency department initial blood pressure 173/94 and patient was noted to be tachycardic in 120s   2 weeks ago patient experienced siblings she will call up to water run out of her C Pap she was feeling day drying short of breath and he'll husband called EMS who told a thoracentesis was secondary to an anxiety and she did not present to emergency department she's been having elevated blood pressures in 200s Patient did recover from her is that have been having worsening shortness of breath with exertion lately. She has a history of pulmonary hypertension has had seen cardiology on August 5 the plan was for her to have repeat echogram     While in ER: felt to be likelysecondary dentist exam but given extensive factors ER M.D. Would like to have evaluated for ACS CT negative for PE Attempt to use nebulizer to see if that would help with reactive airway disease as well as Ativan for possible and anxiety Patient somewhat improved but continued to have  burning chest pain  accepted to step down transfer from [facility] [place] The following Work up has been ordered so far:  Orders Placed This Encounter  Procedures  . DG Chest 2 View  . CT Angio Chest PE W and/or Wo Contrast  . Troponin I  . CBC with Differential  . Brain natriuretic peptide  . Comprehensive metabolic panel  . TSH  . Troponin I  . Diet Heart Room service appropriate? Yes; Fluid consistency: Thin  . Cardiac monitoring  . Page Admitting Doctor upon patients arrival to unit/floor  . Vital signs  . Up with assistance  . Initiate Carrier Fluid Protocol  . Patient may eat/drink  . Consult to hospitalist  . I-Stat Venous Blood Gas, ED (order at [facility] and [facility] only)  . ED EKG  . EKG 12-Lead  . Saline lock IV  . Saline lock IV  . Saline lock IV  . Place in observation (patient's expected length of stay will be less than 2 midnights)      Following Medications were ordered in ER: Medications  ipratropium-albuterol (DUONEB) 0.5-2.5 (3) MG/3ML nebulizer solution 3 mL (3 mLs Nebulization Given 10/07/17 1553)  LORazepam (ATIVAN) injection 1 mg (1 mg Intravenous Given 10/07/17 1558)  iopamidol (ISOVUE-370) 76 % injection 100 mL (100 mLs Intravenous Contrast Given 10/07/17 1651)  gi cocktail (Maalox,Lidocaine,Donnatal) (30 mLs Oral Given 10/07/17 1736)  HYDROcodone-acetaminophen (NORCO/VICODIN) 5-325 MG  per tablet 1 tablet (1 tablet Oral Given 10/07/17 1833)    Significant initial  Findings: Abnormal Labs Reviewed  COMPREHENSIVE METABOLIC PANEL - Abnormal; Notable for the following components:      Result Value   Glucose, Bld 158 (*)    Calcium 8.7 (*)    All other components within normal limits  I-STAT VENOUS BLOOD GAS, ED - Abnormal; Notable for the following components:   pO2, Ven 28.0 (*)    All other components within normal limits    TSH 3.178  Na 138 K 3.6  Trop <0.03  Cr    stable,   Lab Results  Component Value Date   CREATININE 0.93 10/07/2017    CREATININE 0.77 03/19/2017   CREATININE 0.76 03/18/2017      WBC 7.2  HG/HCT  Stable     Component Value Date/Time   HGB 14.7 10/07/2017 1501   HGB 14.1 03/10/2017 1117   HCT 43.8 10/07/2017 1501   HCT 42.3 03/10/2017 1117          BNP (last 3 results) Recent Labs    02/26/17 2337 10/07/17 1501  BNP 13.9 19.0    ProBNP (last 3 results) No results for input(s): PROBNP in the last 8760 hours.  Lactic Acid, Venous    Component Value Date/Time   LATICACIDVEN 0.9 02/26/2017 2337      UA   no evidence of UTI    CTA no PE    CXR -  NON acute    ECG:  Personally reviewed by me showing: HR : 116 Rhythm: sinus tachycardia left anterior fascicular block   nonspecific changes QTC 453       ED Triage Vitals  Enc Vitals Group     BP 10/07/17 1457 (!) 173/94     Pulse Rate 10/07/17 1457 (!) 122     Resp 10/07/17 1457 (!) 28     Temp 10/07/17 1457 97.8 F (36.6 C)     Temp Source 10/07/17 1530 Oral     SpO2 10/07/17 1457 98 %     Weight 10/07/17 1456 240 lb (108.9 kg)     Height 10/07/17 1456 5\' 7"  (1.702 m)     Head Circumference --      Peak Flow --      Pain Score 10/07/17 1456 5     Pain Loc --      Pain Edu? --      Excl. in GC? --   TMAX(24)@       Latest  Blood pressure 132/76, pulse (!) 111, temperature 98 F (36.7 C), temperature source Oral, resp. rate 14, height 5\' 7"  (1.702 m), weight 108.9 kg (240 lb), SpO2 95 %.   Hospitalist was called for admission for chest pain evaluation   Review of Systems:    Pertinent positives include:  chest pain shortness of breath at rest.     Constitutional:  No weight loss, night sweats, Fevers, chills, fatigue, weight loss  HEENT:  No headaches, Difficulty swallowing,Tooth/dental problems,Sore throat,  No sneezing, itching, ear ache, nasal congestion, post nasal drip,  Cardio-vascular:  No, Orthopnea, PND, anasarca, dizziness, palpitations.no Bilateral lower extremity swelling  GI:  No  heartburn, indigestion, abdominal pain, nausea, vomiting, diarrhea, change in bowel habits, loss of appetite, melena, blood in stool, hematemesis Resp:  no  No excess mucus, no productive cough, No non-productive cough, No coughing up of blood.No change in color of mucus.No wheezing. Skin:  no rash or lesions. No jaundice  GU:  no dysuria, change in color of urine, no urgency or frequency. No straining to urinate.  No flank pain.  Musculoskeletal:  No joint pain or no joint swelling. No decreased range of motion. No back pain.  Psych:  No change in mood or affect. No depression or anxiety. No memory loss.  Neuro: no localizing neurological complaints, no tingling, no weakness, no double vision, no gait abnormality, no slurred speech, no confusion  All systems reviewed and apart from HOPI all are negative  Past Medical History:   Past Medical History:  Diagnosis Date  . Arthritis   . DVT (deep venous thrombosis) (HCC)   . Pulmonary embolism (HCC)   . Thyroid disease       Past Surgical History:  Procedure Laterality Date  . APPENDECTOMY    . BACK SURGERY    . CHOLECYSTECTOMY    . OVARY SURGERY      Social History:  Ambulatory  cane,       reports that she has never smoked. She has never used smokeless tobacco. She reports that she does not drink alcohol or use drugs.     Family History:   Family History  Problem Relation Age of Onset  . Hypertension Mother   . Cancer Mother   . Hypertension Sister   . Hypertension Brother   . Pulmonary disease Neg Hx     Allergies: Allergies  Allergen Reactions  . Nsaids Other (See Comments)    Not suppose to take with her blood thinner  . Other Other (See Comments)    Extreme stomach pain if any of these are eaten: spicy foods, onions, fried foods, cauliflower, and broccoli  . Prednisone Other (See Comments)    Not suppose to take due to advanced osteoporosis  . Risedronate Other (See Comments)    (Actonel) Caused  EXTREME JAW PAIN and muscles and joints ached   . Adhesive [Tape] Rash and Other (See Comments)    EKG pads left welts on the skin  . Codeine Palpitations and Other (See Comments)    Tachycardia   . Penicillins Rash    Has patient had a PCN reaction causing immediate rash, facial/tongue/throat swelling, SOB or lightheadedness with hypotension: Yes Has patient had a PCN reaction causing severe rash involving mucus membranes or skin necrosis: Unk Has patient had a PCN reaction that required hospitalization: Unk Has patient had a PCN reaction occurring within the last 10 years: Unk If all of the above answers are "NO", then may proceed with Cephalosporin use.      Prior to Admission medications   Medication Sig Start Date End Date Taking? Authorizing Provider  acetaminophen (TYLENOL) 650 MG CR tablet Take 650 mg by mouth every 8 (eight) hours as needed for pain.    [provider]  albuterol (PROAIR HFA) 108 (90 Base) MCG/ACT inhaler Inhale 2 puffs into the lungs See admin instructions. 2 puffs every four to six hours as needed for shortness of breath    [provider]  albuterol (PROVENTIL) (2.5 MG/3ML) 0.083% nebulizer solution Take 2.5 mg by nebulization every 4 (four) hours as needed for wheezing or shortness of breath (AS DIRECTED BY MD).  12/04/15   [provider]  apixaban (ELIQUIS) 5 MG TABS tablet Take 1 tablet (5 mg total) by mouth 2 (two) times daily. 04/21/17   [name], [name], MD  benzonatate (TESSALON) 200 MG capsule Take 1 capsule (200 mg total) by mouth 3 (three) times daily as needed  for cough. 05/21/17   Bevelyn Ngo, NP  betamethasone dipropionate (DIPROLENE) 0.05 % ointment Apply 1 application topically 2 (two) times daily as needed (to affected areas).  01/09/17   [provider]  Black Cohosh 20 MG TABS Take 20 mg by mouth 3 (three) times a week.    [provider]  fluticasone (FLONASE) 50 MCG/ACT nasal spray PLACE 2  SPRAYS INTO BOTH NOSTRILS AT BEDTIME AS NEEDED FOR ALLERGIES OR RHINITIS. 07/31/17   Coralyn Helling, MD  fluticasone (FLOVENT HFA) 110 MCG/ACT inhaler Inhale 2 puffs into the lungs 2 (two) times daily. 04/22/17   Coralyn Helling, MD  hydrochlorothiazide (MICROZIDE) 12.5 MG capsule Take 12.5 mg by mouth daily as needed for edema or fluid. 03/12/17   [provider]  HYDROcodone-acetaminophen (NORCO/VICODIN) 5-325 MG tablet Take 1 tablet by mouth every 8 (eight) hours as needed for pain. 03/12/17   [provider]  levothyroxine (SYNTHROID, LEVOTHROID) 50 MCG tablet Take 50 mcg by mouth every Monday, Wednesday, and Friday. 10/06/16   [provider]  levothyroxine Erline Levine, LEVOTHROID) 75 MCG tablet Take 75 mcg by mouth every Tuesday, Thursday, Saturday, and Sunday. 10/29/15   [provider]  loratadine (CLARITIN) 10 MG tablet Take 1 tablet (10 mg total) by mouth daily. 06/10/17   Bevelyn Ngo, NP  meclizine (ANTIVERT) 25 MG tablet Take 25 mg by mouth 3 (three) times daily as needed for dizziness.  12/13/16   [provider]  methocarbamol (ROBAXIN) 750 MG tablet Take 750 mg by mouth 3 (three) times daily as needed for muscle spasms.  06/27/16   [provider]  metoprolol succinate (TOPROL-XL) 25 MG 24 hr tablet Take 25 mg by mouth at bedtime.  02/09/17   [provider]  montelukast (SINGULAIR) 10 MG tablet TAKE 1 TABLET BY MOUTH EVERYDAY AT BEDTIME 08/11/17   Coralyn Helling, MD  nystatin (MYCOSTATIN) 100000 UNIT/ML suspension Take 5 mLs (500,000 Units total) by mouth 4 (four) times daily. 09/23/17   Coralyn Helling, MD  omeprazole (PRILOSEC) 20 MG capsule Take 20 mg by mouth daily as needed (for reflux).  02/06/17   [provider]  OVER THE COUNTER MEDICATION Remefemin tablets: Take 1 tablet by mouth two to three times a week    [provider]  Spacer/Aero-Holding Chambers (AEROCHAMBER MV) inhaler Use as instructed 09/23/17   Coralyn Helling, MD  UNABLE TO FIND Compression stockings, daily 02/28/17   Leatha Gilding, MD  Vitamin D, Ergocalciferol, (DRISDOL) 50000 units CAPS capsule Take 50,000 Units by mouth once a week. 06/29/17   [provider]   Physical Exam: Blood pressure 132/76, pulse (!) 111, temperature 98 F (36.7 C), temperature source Oral, resp. rate 14, height 5\' 7"  (1.702 m), weight 108.9 kg (240 lb), SpO2 95 %. 1. General:  in No Acute distress  well  -appearing 2. Psychological: Alert and  Oriented 3. Head/ENT:     Dry Mucous Membranes                          Head Non traumatic, neck supple                          Poor Dentition 4. SKIN:   decreased Skin turgor,  Skin clean Dry and intact no rash 5. Heart: Regular rate and rhythm no Murmur, no Rub or gallop 6. Lungs:   no wheezes or crackles  7. Abdomen: Soft, non-tender, Non distended   8. Lower extremities: no clubbing, cyanosis, or  edema 9. Neurologically Grossly intact, moving all 4 extremities equally   10. MSK: Normal range of motion   LABS:     Recent Labs  Lab 10/07/17 1501  WBC 7.2  NEUTROABS 4.0  HGB 14.7  HCT 43.8  MCV 89.4  PLT 218   Basic Metabolic Panel: Recent Labs  Lab 10/07/17 1600  NA 138  K 3.6  CL 105  CO2 25  GLUCOSE 158*  BUN 15  CREATININE 0.93  CALCIUM 8.7*      Recent Labs  Lab 10/07/17 1600  AST 26  ALT 23  ALKPHOS 69  BILITOT 0.7  PROT 7.2  ALBUMIN 3.8   No results for input(s): LIPASE, AMYLASE in the last 168 hours. No results for input(s): AMMONIA in the last 168 hours.    HbA1C: No results for input(s): HGBA1C in the last 72 hours. CBG: No results for input(s): GLUCAP in the last 168 hours.    Urine analysis: No results found for: COLORURINE, APPEARANCEUR, LABSPEC, PHURINE, GLUCOSEU, HGBUR, BILIRUBINUR, KETONESUR, PROTEINUR, UROBILINOGEN, NITRITE, LEUKOCYTESUR     Cultures: No results found for: SDES, SPECREQUEST, CULT, REPTSTATUS   Radiological Exams on  Admission: Dg Chest 2 View  Result Date: 10/07/2017 CLINICAL DATA:  Chest pain radiating to the abdomen EXAM: CHEST - 2 VIEW COMPARISON:  September 23, 2017 FINDINGS: The heart size and mediastinal contours are within normal limits. There is no focal infiltrate, pulmonary edema, or pleural effusion. Prior vertebral plasties are identified in the spine. IMPRESSION: No active cardiopulmonary disease. Electronically Signed   By: Sherian Rein M.D.   On: 10/07/2017 15:39   Ct Angio Chest Pe W And/or Wo Contrast  Result Date: 10/07/2017 CLINICAL DATA:  67 year old female with a history of weakness and chest pain EXAM: CT ANGIOGRAPHY CHEST WITH CONTRAST TECHNIQUE: Multidetector CT imaging of the chest was performed using the standard protocol during bolus administration of intravenous contrast. Multiplanar CT image reconstructions and MIPs were obtained to evaluate the vascular anatomy. CONTRAST:  ISOVUE-370 IOPAMIDOL (ISOVUE-370) INJECTION 76% COMPARISON:  03/18/2017 FINDINGS: Cardiovascular: Heart: No cardiomegaly. No pericardial fluid/thickening. No significant coronary calcifications. Aorta: Unremarkable course, caliber, contour of the thoracic aorta. No aneurysm or dissection flap. No periaortic fluid. Minimal calcifications of the aortic arch. Branch vessels are patent including the cervical cerebral vessels at the neck base. Pulmonary arteries: No central, lobar, segmental, or proximal subsegmental filling defects. Distal evaluation is limited by the contrast bolus timing and motion artifact. Mediastinum/Nodes: No mediastinal adenopathy. Unremarkable appearance of the thoracic esophagus. Hiatal hernia. Unremarkable appearance of the thoracic inlet Lungs/Pleura: Central airways are clear. No pleural effusion. No confluent airspace disease. Motion artifact particularly at the lung bases.  No pneumothorax. Upper Abdomen: No acute finding of the upper abdomen. Musculoskeletal: No acute displaced fracture.  Degenerative changes of the spine including vacuum disc phenomenon at the lower thoracic levels. Changes of prior vertebral augmentation of T12 and L1. Review of the MIP images confirms the above findings. IMPRESSION: CT negative for pulmonary emboli. No acute CT finding to account for chest pain. Hiatal hernia. Aortic Atherosclerosis (ICD10-I70.0). Electronically Signed   By: Gilmer Mor D.O.   On: 10/07/2017 17:20    Chart has been reviewed    Assessment/Plan  67 y.o. female with medical history significant of chronic bronchitis, asthma, obstructive sleep apnea, and PE with DVT.     Admitted for chest pain evaluation  Present on Admission: . Precordial chest pain . Atypical chest pain - somewhat atypical but given risk factors will admit and monitor on tel Cycle CE Serial ECG email cardiology  order ECHO   . Dyspnea and respiratory abnormalities -Some response to nebulizer will continue PRN Fllowed by pulmonology   . Gastroesophageal reflux disease without esophagitis - continue PPI  . Hypothyroidism TSH WNL  Continue synthroid . OSA (obstructive sleep apnea) - CPAP ordered . Pulmonary embolism (HCC) - continue eliquis . Pulmonary artery hypertension (HCC) - Followed by cardiology order echo as they have recommended in the past . Sinus tachycardia rehydrate continue beta blocker, stop Herbal remedies for now   Other plan as per orders.  DVT prophylaxis:  eliquis   Code Status:  FULL CODE   as per patient  I had personally discussed CODE STATUS with patient and family  Family Communication:   Family  at  Bedside  plan of care was discussed with   , Husband  , Disposition Plan:    To home once workup is complete and patient is stable                                        Consults called: Cardiology     Admission status:   obs   Level of care    SDU      Phuc Kluttz 10/08/2017, 12:39 AM    [facility]  Pager [phone]   after 2 AM please page  floor coverage PA If 7AM-7PM, please contact the day team taking care of the patient  [email or web address]  Password [email or web address]

## 2017-10-07 NOTE — ED Notes (Signed)
Patient transported to CT 

## 2017-10-07 NOTE — Plan of Care (Signed)
67 y.o. female hx of DVT, PE thyroid disease Shortness of breath and chest pain CTA negative for PE, TSH negative nonischemic EKG troponins unremarkable continues to have ongoing chest pain accepted to step down observation  Amand Lemoine 6:59 PM

## 2017-10-07 NOTE — ED Notes (Signed)
ED Provider at bedside. 

## 2017-10-07 NOTE — ED Triage Notes (Addendum)
Pt helped out of car , noted to be pale. Pt c/o generalized weakness/ chest pain  x 2 hrs HX PE

## 2017-10-07 NOTE — ED Provider Notes (Signed)
Emergency Department Provider Note   I have reviewed the triage vital signs and the nursing notes.   HISTORY  Chief Complaint Weakness   HPI Gail Olson is a 67 y.o. female with PMH of PE on Eliquis, pulmonary HTN, OSA, thyroid disease, and arthritis resents to the emergency department for evaluation of acute onset burning chest discomfort with associated dyspnea and lightheadedness.  The patient has a history of PE but states she is been compliant with her Eliquis.  She is followed by cardiology at [facility] for pulmonary hypertension.  That she had a similar set of symptoms earlier this month and called EMS.  He states at that time she was told she was having a panic attack and was not transported to the emergency department.  Symptoms improved slightly at home and she followed up with her cardiologist later that week.  She has an echo scheduled for later this month.   Today, after lunch, the patient states she was sitting down to read when she had sudden onset lightheadedness with burning, slightly right-sided chest discomfort.  She had dyspnea.  She denies any fevers or chills.  No productive cough.  Denies any active bleeding in the bowel movements or elsewhere.  No other modifying or provoking factors. No radiation of symptoms.   Past Medical History:  Diagnosis Date  . Arthritis   . DVT (deep venous thrombosis) (HCC)   . Pulmonary embolism (HCC)   . Thyroid disease     Patient Active Problem List   Diagnosis Date Noted  . Precordial chest pain 10/07/2017  . Pulmonary artery hypertension (HCC) 05/21/2017  . Simple chronic bronchitis (HCC) 05/06/2017  . OSA (obstructive sleep apnea) 05/06/2017  . Chest pain 03/18/2017  . Pulmonary embolism (HCC) 02/26/2017  . Hypothyroidism 02/26/2017  . Atypical chest pain 11/01/2015  . Palpitations 11/01/2015  . Nodule of right lung 10/25/2015  . Dyspnea and respiratory abnormalities 08/01/2015  . Sinus tachycardia 08/01/2015    . Gastroesophageal reflux disease without esophagitis 05/24/2014  . Elevated blood-pressure reading without diagnosis of hypertension 05/24/2014  . Lichen sclerosus et atrophicus 12/16/2013  . Vitamin D deficiency 12/16/2013    Past Surgical History:  Procedure Laterality Date  . APPENDECTOMY    . BACK SURGERY    . CHOLECYSTECTOMY    . OVARY SURGERY      Allergies Nsaids; Other; Prednisone; Risedronate; Adhesive [tape]; Codeine; and Penicillins  Family History  Problem Relation Age of Onset  . Hypertension Mother   . Cancer Mother   . Hypertension Sister   . Hypertension Brother   . Pulmonary disease Neg Hx     Social History Social History   Tobacco Use  . Smoking status: Never Smoker  . Smokeless tobacco: Never Used  Substance Use Topics  . Alcohol use: No    Frequency: Never  . Drug use: No    Review of Systems  Constitutional: No fever/chills Eyes: No visual changes. ENT: No sore throat. Cardiovascular: Positive chest pain and lightheadedness. Respiratory: Positive shortness of breath. Gastrointestinal: No abdominal pain.  No nausea, no vomiting.  No diarrhea.  No constipation. Genitourinary: Negative for dysuria. Musculoskeletal: Negative for back pain. Skin: Negative for rash. Neurological: Negative for headaches, focal weakness or numbness.  10-point ROS otherwise negative.  ____________________________________________   PHYSICAL EXAM:  VITAL SIGNS: ED Triage Vitals  Enc Vitals Group     BP 10/07/17 1457 (!) 173/94     Pulse Rate 10/07/17 1457 (!) 122  Resp 10/07/17 1457 (!) 28     Temp 10/07/17 1457 97.8 F (36.6 C)     Temp src --      SpO2 10/07/17 1457 98 %     Weight 10/07/17 1456 240 lb (108.9 kg)     Height 10/07/17 1456 5\' 7"  (1.702 m)     Pain Score 10/07/17 1456 5   Constitutional: Alert and oriented. Increased WOB but able to provide history.  Eyes: Conjunctivae are normal.  Head: Atraumatic. Nose: No  congestion/rhinnorhea. Mouth/Throat: Mucous membranes are moist.   Neck: No stridor.  Cardiovascular: Tachycardia. Good peripheral circulation. Grossly normal heart sounds.   Respiratory: Increased respiratory effort.  No retractions. Lungs are diminished at the right base. No wheezing.  Gastrointestinal: Soft and nontender. No distention.  Musculoskeletal: No lower extremity tenderness nor edema. No gross deformities of extremities. Neurologic:  Normal speech and language. No gross focal neurologic deficits are appreciated.  Skin:  Skin is warm, dry and intact. No rash noted.  ____________________________________________   LABS (all labs ordered are listed, but only abnormal results are displayed)  Labs Reviewed  COMPREHENSIVE METABOLIC PANEL - Abnormal; Notable for the following components:      Result Value   Glucose, Bld 158 (*)    Calcium 8.7 (*)    All other components within normal limits  I-STAT VENOUS BLOOD GAS, ED - Abnormal; Notable for the following components:   pO2, Ven 28.0 (*)    All other components within normal limits  TROPONIN I  CBC WITH DIFFERENTIAL/PLATELET  BRAIN NATRIURETIC PEPTIDE  TSH  TROPONIN I   ____________________________________________  EKG   EKG Interpretation  Date/Time:  Wednesday October 07 2017 14:59:38 EDT Ventricular Rate:  116 PR Interval:    QRS Duration: 86 QT Interval:  326 QTC Calculation: 453 R Axis:   -50 Text Interpretation:  Sinus tachycardia Left anterior fascicular block Abnormal R-wave progression, early transition Minimal ST depression, lateral leads No STEMI.  Confirmed by Alona BeneLong, Srah Ake [id number]) on 10/07/2017 5:01:18 PM       ____________________________________________  RADIOLOGY  Dg Chest 2 View  Result Date: 10/07/2017 CLINICAL DATA:  Chest pain radiating to the abdomen EXAM: CHEST - 2 VIEW COMPARISON:  September 23, 2017 FINDINGS: The heart size and mediastinal contours are within normal limits. There is no focal  infiltrate, pulmonary edema, or pleural effusion. Prior vertebral plasties are identified in the spine. IMPRESSION: No active cardiopulmonary disease. Electronically Signed   By: Sherian ReinWei-Chen  Lin M.D.   On: 10/07/2017 15:39   Ct Angio Chest Pe W And/or Wo Contrast  Result Date: 10/07/2017 CLINICAL DATA:  67 year old female with a history of weakness and chest pain EXAM: CT ANGIOGRAPHY CHEST WITH CONTRAST TECHNIQUE: Multidetector CT imaging of the chest was performed using the standard protocol during bolus administration of intravenous contrast. Multiplanar CT image reconstructions and MIPs were obtained to evaluate the vascular anatomy. CONTRAST:  100mL ISOVUE-370 IOPAMIDOL (ISOVUE-370) INJECTION 76% COMPARISON:  03/18/2017 FINDINGS: Cardiovascular: Heart: No cardiomegaly. No pericardial fluid/thickening. No significant coronary calcifications. Aorta: Unremarkable course, caliber, contour of the thoracic aorta. No aneurysm or dissection flap. No periaortic fluid. Minimal calcifications of the aortic arch. Branch vessels are patent including the cervical cerebral vessels at the neck base. Pulmonary arteries: No central, lobar, segmental, or proximal subsegmental filling defects. Distal evaluation is limited by the contrast bolus timing and motion artifact. Mediastinum/Nodes: No mediastinal adenopathy. Unremarkable appearance of the thoracic esophagus. Hiatal hernia. Unremarkable appearance of the thoracic inlet Lungs/Pleura: Central  airways are clear. No pleural effusion. No confluent airspace disease. Motion artifact particularly at the lung bases.  No pneumothorax. Upper Abdomen: No acute finding of the upper abdomen. Musculoskeletal: No acute displaced fracture. Degenerative changes of the spine including vacuum disc phenomenon at the lower thoracic levels. Changes of prior vertebral augmentation of T12 and L1. Review of the MIP images confirms the above findings. IMPRESSION: CT negative for pulmonary emboli. No  acute CT finding to account for chest pain. Hiatal hernia. Aortic Atherosclerosis (ICD10-I70.0). Electronically Signed   By: Gilmer Mor D.O.   On: 10/07/2017 17:20    ____________________________________________   PROCEDURES  Procedure(s) performed:   Procedures  None ____________________________________________   INITIAL IMPRESSION / ASSESSMENT AND PLAN / ED COURSE  Pertinent labs & imaging results that were available during my care of the patient were reviewed by me and considered in my medical decision making (see chart for details).  Patient presents to the emergency department for evaluation of burning chest discomfort, dyspnea, lightheadedness.  She has history of pulmonary hypertension, thyroid disease, PE.  She has been compliant with her Eliquis.  Lower suspicion for congestive heart failure or vascular dissection based on clinical exam and history.  Patient's blood pressure is elevated. Plan for CXR, labs, and reassess.   05:40 PM CT angio reviewed and negative for PE. CXR negative. Labs are unremarkable. TSH pending. Patient with continued atypical CP and SOB. Plan for GI cocktail and likely admission for enzyme trending, continued supportive care, and ECHO as an inpatient.   Discussed patient's case with Hospitalist, Dr. Adela Glimpse to request admission. Patient and family (if present) updated with plan. Care transferred to Hospitalist service.  I reviewed all nursing notes, vitals, pertinent old records, EKGs, labs, imaging (as available).  ____________________________________________  FINAL CLINICAL IMPRESSION(S) / ED DIAGNOSES  Final diagnoses:  Precordial chest pain  Dyspnea, unspecified type  Tachycardia    MEDICATIONS GIVEN DURING THIS VISIT:  Medications  ipratropium-albuterol (DUONEB) 0.5-2.5 (3) MG/3ML nebulizer solution 3 mL (3 mLs Nebulization Given 10/07/17 1553)  LORazepam (ATIVAN) injection 1 mg (1 mg Intravenous Given 10/07/17 1558)  iopamidol  (ISOVUE-370) 76 % injection 100 mL (100 mLs Intravenous Contrast Given 10/07/17 1651)  gi cocktail (Maalox,Lidocaine,Donnatal) (30 mLs Oral Given 10/07/17 1736)  HYDROcodone-acetaminophen (NORCO/VICODIN) 5-325 MG per tablet 1 tablet (1 tablet Oral Given 10/07/17 1833)    Note:  This document was prepared using Dragon voice recognition software and may include unintentional dictation errors.  Alona Bene, MD Emergency Medicine    Keahi Mccarney, Arlyss Repress, MD 10/07/17 [phone]

## 2017-10-08 ENCOUNTER — Observation Stay (HOSPITAL_COMMUNITY): Payer: PPO

## 2017-10-08 DIAGNOSIS — G4733 Obstructive sleep apnea (adult) (pediatric): Secondary | ICD-10-CM

## 2017-10-08 DIAGNOSIS — K219 Gastro-esophageal reflux disease without esophagitis: Secondary | ICD-10-CM | POA: Diagnosis not present

## 2017-10-08 DIAGNOSIS — I2782 Chronic pulmonary embolism: Secondary | ICD-10-CM

## 2017-10-08 DIAGNOSIS — R0600 Dyspnea, unspecified: Secondary | ICD-10-CM | POA: Diagnosis not present

## 2017-10-08 DIAGNOSIS — R072 Precordial pain: Secondary | ICD-10-CM | POA: Diagnosis not present

## 2017-10-08 DIAGNOSIS — R079 Chest pain, unspecified: Secondary | ICD-10-CM | POA: Diagnosis not present

## 2017-10-08 DIAGNOSIS — I2721 Secondary pulmonary arterial hypertension: Secondary | ICD-10-CM

## 2017-10-08 DIAGNOSIS — E039 Hypothyroidism, unspecified: Secondary | ICD-10-CM | POA: Diagnosis not present

## 2017-10-08 DIAGNOSIS — I272 Pulmonary hypertension, unspecified: Secondary | ICD-10-CM | POA: Diagnosis not present

## 2017-10-08 DIAGNOSIS — I2699 Other pulmonary embolism without acute cor pulmonale: Secondary | ICD-10-CM | POA: Diagnosis not present

## 2017-10-08 DIAGNOSIS — J45909 Unspecified asthma, uncomplicated: Secondary | ICD-10-CM | POA: Diagnosis not present

## 2017-10-08 DIAGNOSIS — R0689 Other abnormalities of breathing: Secondary | ICD-10-CM

## 2017-10-08 DIAGNOSIS — R000 Tachycardia, unspecified: Secondary | ICD-10-CM | POA: Diagnosis not present

## 2017-10-08 DIAGNOSIS — E669 Obesity, unspecified: Secondary | ICD-10-CM | POA: Diagnosis not present

## 2017-10-08 DIAGNOSIS — E559 Vitamin D deficiency, unspecified: Secondary | ICD-10-CM | POA: Diagnosis not present

## 2017-10-08 DIAGNOSIS — R0789 Other chest pain: Secondary | ICD-10-CM | POA: Diagnosis not present

## 2017-10-08 LAB — ECHOCARDIOGRAM COMPLETE
Height: 67 in
Weight: 3460.34 oz

## 2017-10-08 LAB — TROPONIN I
Troponin I: <0.03 ng/mL (ref <0.03)
Troponin I: <0.03 ng/mL (ref <0.03)
Troponin I: <0.03 ng/mL (ref <0.03)

## 2017-10-08 LAB — MRSA PCR SCREENING: MRSA by PCR: NEGATIVE

## 2017-10-08 LAB — HIV ANTIBODY (ROUTINE TESTING W REFLEX): HIV Screen 4th Generation wRfx: NONREACTIVE

## 2017-10-08 MED ORDER — BUDESONIDE 0.25 MG/2ML IN SUSP
0.2500 mg | Freq: Two times a day (BID) | RESPIRATORY_TRACT | Status: DC
  Administered 2017-10-08 – 2017-10-09 (×3): 0.25 mg via RESPIRATORY_TRACT
  Filled 2017-10-08 (×5): qty 2

## 2017-10-08 MED ORDER — HYDROCODONE-ACETAMINOPHEN 5-325 MG PO TABS: 1.0000 | ORAL_TABLET | Freq: Three times a day (TID) | ORAL | Status: DC | PRN

## 2017-10-08 MED ORDER — LEVOTHYROXINE SODIUM 75 MCG PO TABS: 75.0000 ug | ORAL_TABLET | ORAL | Status: DC

## 2017-10-08 MED ORDER — ACETAMINOPHEN 325 MG PO TABS
650.0000 mg | ORAL_TABLET | ORAL | Status: DC | PRN
  Administered 2017-10-09: 650 mg via ORAL
  Filled 2017-10-08: qty 2

## 2017-10-08 MED ORDER — LEVOTHYROXINE SODIUM 50 MCG PO TABS: 50.0000 ug | ORAL_TABLET | ORAL | Status: DC

## 2017-10-08 MED ORDER — MORPHINE SULFATE (PF) 2 MG/ML IV SOLN: 2.0000 mg | INTRAVENOUS | Status: DC | PRN

## 2017-10-08 MED ORDER — METOPROLOL TARTRATE 5 MG/5ML IV SOLN: 2.5000 mg | INTRAVENOUS | Status: DC | PRN

## 2017-10-08 MED ORDER — PANTOPRAZOLE SODIUM 40 MG PO TBEC
40.0000 mg | DELAYED_RELEASE_TABLET | Freq: Every day | ORAL | Status: DC
  Administered 2017-10-08 – 2017-10-10 (×3): 40 mg via ORAL
  Filled 2017-10-08 (×3): qty 1

## 2017-10-08 MED ORDER — LEVALBUTEROL HCL 0.63 MG/3ML IN NEBU: 0.6300 mg | INHALATION_SOLUTION | RESPIRATORY_TRACT | Status: DC | PRN

## 2017-10-08 MED ORDER — METOPROLOL TARTRATE 50 MG PO TABS
50.0000 mg | ORAL_TABLET | Freq: Once | ORAL | Status: AC
  Administered 2017-10-09: 50 mg via ORAL
  Filled 2017-10-08: qty 1

## 2017-10-08 MED ORDER — METOPROLOL SUCCINATE ER 25 MG PO TB24
25.0000 mg | ORAL_TABLET | Freq: Once | ORAL | Status: AC
  Administered 2017-10-08: 25 mg via ORAL
  Filled 2017-10-08: qty 1

## 2017-10-08 MED ORDER — MONTELUKAST SODIUM 10 MG PO TABS
10.0000 mg | ORAL_TABLET | Freq: Every day | ORAL | Status: DC
  Administered 2017-10-08 – 2017-10-09 (×3): 10 mg via ORAL
  Filled 2017-10-08 (×3): qty 1

## 2017-10-08 MED ORDER — LORATADINE 10 MG PO TABS
10.0000 mg | ORAL_TABLET | Freq: Every day | ORAL | Status: DC
  Administered 2017-10-08 – 2017-10-10 (×3): 10 mg via ORAL
  Filled 2017-10-08 (×3): qty 1

## 2017-10-08 MED ORDER — LEVOTHYROXINE SODIUM 50 MCG PO TABS
50.0000 ug | ORAL_TABLET | Freq: Every day | ORAL | Status: DC
  Administered 2017-10-08 – 2017-10-10 (×3): 50 ug via ORAL
  Filled 2017-10-08 (×3): qty 1

## 2017-10-08 MED ORDER — METHOCARBAMOL 500 MG PO TABS: 750.0000 mg | ORAL_TABLET | Freq: Three times a day (TID) | ORAL | Status: DC | PRN

## 2017-10-08 MED ORDER — NITROGLYCERIN 0.4 MG SL SUBL: 0.4000 mg | SUBLINGUAL_TABLET | SUBLINGUAL | Status: DC | PRN

## 2017-10-08 MED ORDER — ONDANSETRON HCL 4 MG/2ML IJ SOLN: 4.0000 mg | Freq: Four times a day (QID) | INTRAMUSCULAR | Status: DC | PRN

## 2017-10-08 MED ORDER — METOPROLOL SUCCINATE ER 25 MG PO TB24: 25.0000 mg | ORAL_TABLET | Freq: Every day | ORAL | Status: DC

## 2017-10-08 MED ORDER — IPRATROPIUM-ALBUTEROL 0.5-2.5 (3) MG/3ML IN SOLN
3.0000 mL | Freq: Two times a day (BID) | RESPIRATORY_TRACT | Status: DC
  Administered 2017-10-08 – 2017-10-09 (×2): 3 mL via RESPIRATORY_TRACT
  Filled 2017-10-08 (×3): qty 3

## 2017-10-08 MED ORDER — APIXABAN 5 MG PO TABS
5.0000 mg | ORAL_TABLET | Freq: Two times a day (BID) | ORAL | Status: DC
  Administered 2017-10-08 – 2017-10-10 (×6): 5 mg via ORAL
  Filled 2017-10-08 (×6): qty 1

## 2017-10-08 MED ORDER — SODIUM CHLORIDE 0.9 % IV SOLN
INTRAVENOUS | Status: DC
  Administered 2017-10-08: 01:00:00 via INTRAVENOUS

## 2017-10-08 MED ORDER — LORAZEPAM 0.5 MG PO TABS
0.5000 mg | ORAL_TABLET | Freq: Once | ORAL | Status: AC
  Administered 2017-10-09: 0.5 mg via ORAL
  Filled 2017-10-08: qty 1

## 2017-10-08 NOTE — Consult Note (Addendum)
Cardiology Consult    Patient ID: Gail Olson MRN: [id number], DOB/AGE: August 15, 1950   Admit date: 10/07/2017 Date of Consult: 10/08/2017  Primary Physician: Shellia Cleverly, PA Primary Cardiologist: Dr. Janyce Llanos Requesting Provider: Dr. Adela Glimpse Reason for Consultation: Chest pain  Gail Olson is a 67 y.o. female who is being seen today for the evaluation of chest pain at the request of Dr. Adela Glimpse.   Patient Profile    67 yo female with PMH of DVT, Arthritis, PE and hypothyroidism who presented with chest pain.   Past Medical History   Past Medical History:  Diagnosis Date  . Arthritis   . DVT (deep venous thrombosis) (HCC)   . Pulmonary embolism (HCC)   . Thyroid disease     Past Surgical History:  Procedure Laterality Date  . APPENDECTOMY    . BACK SURGERY    . CHOLECYSTECTOMY    . OVARY SURGERY       Allergies  Allergies  Allergen Reactions  . Nsaids Other (See Comments)    Not suppose to take with her blood thinner  . Other Other (See Comments)    Extreme stomach pain if any of these are eaten: spicy foods, onions, fried foods, cauliflower, and broccoli  . Prednisone Other (See Comments)    Not suppose to take due to advanced osteoporosis  . Risedronate Other (See Comments)    (Actonel) Caused EXTREME JAW PAIN and muscles and joints ached   . Adhesive [Tape] Rash and Other (See Comments)    EKG pads left welts on the skin  . Codeine Palpitations and Other (See Comments)    Tachycardia   . Penicillins Rash    Has patient had a PCN reaction causing immediate rash, facial/tongue/throat swelling, SOB or lightheadedness with hypotension: Yes Has patient had a PCN reaction causing severe rash involving mucus membranes or skin necrosis: Unk Has patient had a PCN reaction that required hospitalization: Unk Has patient had a PCN reaction occurring within the last 10 years: Unk If all of the above answers are "NO", then may proceed with Cephalosporin  use.     History of Present Illness    67 yo female with PMH of DVT, Arthritis, PE and hypothyroidism.  She is followed by Dr. Bing Matter as an outpatient.  Reports she was diagnosed with DVT/PE back in January and has been on Eliquis since then.  She had an episode about 3 weeks ago in early morning where she developed palpitations, diaphoresis and chest pain.  EMS was called her house, but felt as though this was anxiety related, and did not transport her to the ER.  She later had a follow-up with Dr. Bing Matter on 10/05/2017 where she reported ongoing fatigue and worsening shortness of breath with her day-to-day activities.  Stated this seemed to have worsened over the past 3 weeks prior to her appointment.  Plan was for outpatient echocardiogram to further evaluate.  States she was sitting yesterday afternoon reading a book whenever she developed palpitations, diaphoresis, flushing and then chest pressure.  Called her neighbor who came over and checked her heart rate and noted it was high.  Eventually called her husband who brought her to the ER for further evaluation.  In the ED her labs showed stable electrolytes, troponin negative x3, hemoglobin 14.7 TSH 3.1.  Chest x-ray was negative for acute findings.  She underwent a CT angiogram which was negative for PE.  EKG showed sinus tachycardia without acute ST/T wave abnormalities.  She  was admitted to internal medicine for further work-up.  Inpatient Medications    . apixaban  5 mg Oral BID  . budesonide  0.25 mg Nebulization BID  . ipratropium-albuterol  3 mL Nebulization BID  . levothyroxine  50 mcg Oral QAC breakfast  . loratadine  10 mg Oral Daily  . [START ON 10/09/2017] metoprolol succinate  25 mg Oral QHS  . montelukast  10 mg Oral QHS  . pantoprazole  40 mg Oral Daily    Family History    Family History  Problem Relation Age of Onset  . Hypertension Mother   . Cancer Mother   . Hypertension Sister   . Hypertension Brother   .  Pulmonary disease Neg Hx     Social History    Social History   Socioeconomic History  . Marital status: Married    Spouse name: Not on file  . Number of children: Not on file  . Years of education: Not on file  . Highest education level: Not on file  Occupational History  . Not on file  Social Needs  . Financial resource strain: Not on file  . Food insecurity:    Worry: Not on file    Inability: Not on file  . Transportation needs:    Medical: Not on file    Non-medical: Not on file  Tobacco Use  . Smoking status: Never Smoker  . Smokeless tobacco: Never Used  Substance and Sexual Activity  . Alcohol use: No    Frequency: Never  . Drug use: No  . Sexual activity: Not on file  Lifestyle  . Physical activity:    Days per week: Not on file    Minutes per session: Not on file  . Stress: Not on file  Relationships  . Social connections:    Talks on phone: Not on file    Gets together: Not on file    Attends religious service: Not on file    Active member of club or organization: Not on file    Attends meetings of clubs or organizations: Not on file    Relationship status: Not on file  . Intimate partner violence:    Fear of current or ex partner: Not on file    Emotionally abused: Not on file    Physically abused: Not on file    Forced sexual activity: Not on file  Other Topics Concern  . Not on file  Social History Narrative  . Not on file     Review of Systems    See HPI  All other systems reviewed and are otherwise negative except as noted above.  Physical Exam    Blood pressure (!) 142/93, pulse 82, temperature 98.2 F (36.8 C), temperature source Oral, resp. rate (!) 25, height 5\' 7"  (1.702 m), weight 98.1 kg, SpO2 97 %.  General: Pleasant, obese older white female, NAD Psych: Normal affect. Neuro: Alert and oriented X 3. Moves all extremities spontaneously. HEENT: Normal  Neck: Supple without bruits or JVD. Lungs:  Resp regular and unlabored,  CTA. Heart: RRR no murmurs. Abdomen: Soft, non-tender, non-distended, BS + x 4.  Extremities: No clubbing, cyanosis or edema. DP/PT/Radials 2+ and equal bilaterally.  Labs    Troponin (Point of Care Test) No results for input(s): TROPIPOC in the last 72 hours. Recent Labs    10/07/17 1815 10/08/17 0038 10/08/17 0239 10/08/17 0634  TROPONINI <0.03 <0.03 <0.03 <0.03   Lab Results  Component Value Date   WBC  7.2 10/07/2017   HGB 14.7 10/07/2017   HCT 43.8 10/07/2017   MCV 89.4 10/07/2017   PLT 218 10/07/2017    Recent Labs  Lab 10/07/17 1600  NA 138  K 3.6  CL 105  CO2 25  BUN 15  CREATININE 0.93  CALCIUM 8.7*  PROT 7.2  BILITOT 0.7  ALKPHOS 69  ALT 23  AST 26  GLUCOSE 158*   No results found for: CHOL, HDL, LDLCALC, TRIG Lab Results  Component Value Date   DDIMER 10.85 (H) 02/26/2017     Radiology Studies    Dg Chest 2 View  Result Date: 10/07/2017 CLINICAL DATA:  Chest pain radiating to the abdomen EXAM: CHEST - 2 VIEW COMPARISON:  September 23, 2017 FINDINGS: The heart size and mediastinal contours are within normal limits. There is no focal infiltrate, pulmonary edema, or pleural effusion. Prior vertebral plasties are identified in the spine. IMPRESSION: No active cardiopulmonary disease. Electronically Signed   By: Sherian ReinWei-Chen  Lin M.D.   On: 10/07/2017 15:39   Dg Chest 2 View  Result Date: 09/23/2017 CLINICAL DATA:  Dyspnea on exertion. EXAM: CHEST - 2 VIEW COMPARISON:  CT chest 03/18/2017.  Chest two-view 03/18/2017 FINDINGS: Heart size and vascularity normal. Minimal bibasilar atelectasis. Negative for pneumonia or effusion Cement vertebroplasty for and chronic fractures T11 and T12. Chronic fractures T9 and T10 unchanged from the prior CT. IMPRESSION: Mild bibasilar atelectasis.  Chronic thoracic fractures. Electronically Signed   By: Marlan Palauharles  Clark M.D.   On: 09/23/2017 15:54   Ct Angio Chest Pe W And/or Wo Contrast  Result Date: 10/07/2017 CLINICAL DATA:   67 year old female with a history of weakness and chest pain EXAM: CT ANGIOGRAPHY CHEST WITH CONTRAST TECHNIQUE: Multidetector CT imaging of the chest was performed using the standard protocol during bolus administration of intravenous contrast. Multiplanar CT image reconstructions and MIPs were obtained to evaluate the vascular anatomy. CONTRAST:  100mL ISOVUE-370 IOPAMIDOL (ISOVUE-370) INJECTION 76% COMPARISON:  03/18/2017 FINDINGS: Cardiovascular: Heart: No cardiomegaly. No pericardial fluid/thickening. No significant coronary calcifications. Aorta: Unremarkable course, caliber, contour of the thoracic aorta. No aneurysm or dissection flap. No periaortic fluid. Minimal calcifications of the aortic arch. Branch vessels are patent including the cervical cerebral vessels at the neck base. Pulmonary arteries: No central, lobar, segmental, or proximal subsegmental filling defects. Distal evaluation is limited by the contrast bolus timing and motion artifact. Mediastinum/Nodes: No mediastinal adenopathy. Unremarkable appearance of the thoracic esophagus. Hiatal hernia. Unremarkable appearance of the thoracic inlet Lungs/Pleura: Central airways are clear. No pleural effusion. No confluent airspace disease. Motion artifact particularly at the lung bases.  No pneumothorax. Upper Abdomen: No acute finding of the upper abdomen. Musculoskeletal: No acute displaced fracture. Degenerative changes of the spine including vacuum disc phenomenon at the lower thoracic levels. Changes of prior vertebral augmentation of T12 and L1. Review of the MIP images confirms the above findings. IMPRESSION: CT negative for pulmonary emboli. No acute CT finding to account for chest pain. Hiatal hernia. Aortic Atherosclerosis (ICD10-I70.0). Electronically Signed   By: Gilmer MorJaime  Wagner D.O.   On: 10/07/2017 17:20    ECG & Cardiac Imaging    EKG:  The EKG was personally reviewed and demonstrates sinus tachycardia  Echo: Pending  Assessment &  Plan    67 yo female with PMH of DVT, Arthritis, PE and hypothyroidism who presented with chest pain.   1.  Atypical chest pain/palpitations: Reports an episode about 3 weeks ago, and then again yesterday afternoon while at rest.  EKG shows sinus tachycardia, she has ruled out with enzymes.  No further chest pain since admission.  Does have some risk factors for CAD and family history of CAD as well.  Given this we will plan for coronary CT. Unclear what her palpitations could be. Could be SVT, though she is on BB prior to admission. --We will give metoprolol 50 mg x 1 prior to CT as well as Ativan as she is claustrophobic. --Echo pending  2. PE/DVT: has been on Eliquis since being diagnosed back in January. CT angio was negative.   4. Hypothyroidism: On synthroid. TSH 3.1  Signed, Laverda Page, NP-C Pager [phone] 10/08/2017, 12:38 PM   The patient was seen, examined and discussed with Laverda Page, NP-C and I agree with the above.   67 year old patient, FH of early CAD (father died of MI at age 54), with progressively worsening DOE, also acute episodes of CP and diaphoresis, prior stress test about 5 years ago, poor reaction to adenosine vs lexiscan, report not available, echo last year LVEF 60-65%, G1 DD, mildly dilated RV with mild systolic dysfunction, moderate pulmonary hypertension, on Eliquis for DVT/PE, repeat CT yesterday no PE. Ruled out for ACS. She is tachycardic and obese. No CHF on physical exam.   Not a good candidate for a stress test, suboptimal for CTA given high BMI and tachycardia, however might be the best test, I don't want to send her straight to the cath lab. ECG unchanged from prior in January 2019, just increased HR.   Tobias Alexander, MD 10/08/2017

## 2017-10-08 NOTE — Progress Notes (Signed)
PROGRESS NOTE    Gail Olson  ZOX:[id number] DOB: 1950-04-30 DOA: 10/07/2017 PCP: Shellia Cleverly, PA  Brief Narrative: 67 year old female with history of obesity,DVT/PE on Eliquis, arthritis and hypothyroidism presented to the ED last night with palpitations and chest pressure intermittently for 2 weeks  Assessment & Plan:     Chest pain/palpitations -symptoms concerning for arrhythmias -EKG noted sinus tachycardia, nonspecific ST changes -Cardiac enzymes negative -history of cardiac stress test 4 years ago which was unremarkable -Cardiology consulted, to determine need for ischemia evaluation, will also check echocardiogram -Continue metoprolol  -suspect beta agonists for her asthma could be contributing to some extent to her palpitations  History of DVT/PE -This was diagnosed 01/2017 -Continue full dose anticoagulation with Eliquis -Repeat CTA was negative for PE  Hypothyroidism -Continue Synthroid  OSA/obesity/pulmonary hypertension -Continue C Pap   GERD -takes prevacid for this  Asthma -stable, FU with Pulm Dr.Sood  DVT prophylaxis: on ELiquis Code Status: full code Family Communication:husband at bedside Disposition Plan: home pending cardiac workup  Consultants:   cardiology   Procedures:   Antimicrobials:    Subjective: -reports intermittent palpitations and chest pressure  Objective: Vitals:   10/07/17 2328 10/08/17 0804 10/08/17 0845 10/08/17 1322  BP: 132/76  (!) 142/93 122/78  Pulse: (!) 111  82 83  Resp: 14  (!) 25 16  Temp: 98 F (36.7 C)  98.2 F (36.8 C) 97.7 F (36.5 C)  TempSrc: Oral  Oral Oral  SpO2: 95% 99% 97% 98%  Weight: 98.1 kg     Height: 5\' 7"  (1.702 m)       Intake/Output Summary (Last 24 hours) at 10/08/2017 1334 Last data filed at 10/08/2017 1225 Gross per 24 hour  Intake 981.25 ml  Output -  Net 981.25 ml   Filed Weights   10/07/17 1456 10/07/17 2328  Weight: 108.9 kg 98.1 kg    Examination:  General  exam: Appears calm and comfortable, obese, no distress Respiratory system: Clear to auscultation. Respiratory effort normal. Cardiovascular system: S1 & S2 heard, RRR. No JVD, murmurs, rubs, gallops Gastrointestinal system: Abdomen is nondistended, soft and nontender.Normal bowel sounds heard. Central nervous system: Alert and oriented. No focal neurological deficits. Extremities: Symmetric 5 x 5 power. Skin: No rashes, lesions or ulcers Psychiatry: Judgement and insight appear normal. Mood & affect appropriate.     Data Reviewed:   CBC: Recent Labs  Lab 10/07/17 1501  WBC 7.2  NEUTROABS 4.0  HGB 14.7  HCT 43.8  MCV 89.4  PLT 218   Basic Metabolic Panel: Recent Labs  Lab 10/07/17 1600  NA 138  K 3.6  CL 105  CO2 25  GLUCOSE 158*  BUN 15  CREATININE 0.93  CALCIUM 8.7*   GFR: Estimated Creatinine Clearance: 68.7 mL/min (by C-G formula based on SCr of 0.93 mg/dL). Liver Function Tests: Recent Labs  Lab 10/07/17 1600  AST 26  ALT 23  ALKPHOS 69  BILITOT 0.7  PROT 7.2  ALBUMIN 3.8   No results for input(s): LIPASE, AMYLASE in the last 168 hours. No results for input(s): AMMONIA in the last 168 hours. Coagulation Profile: No results for input(s): INR, PROTIME in the last 168 hours. Cardiac Enzymes: Recent Labs  Lab 10/07/17 1501 10/07/17 1815 10/08/17 0038 10/08/17 0239 10/08/17 0634  TROPONINI <0.03 <0.03 <0.03 <0.03 <0.03   BNP (last 3 results) No results for input(s): PROBNP in the last 8760 hours. HbA1C: No results for input(s): HGBA1C in the last 72 hours. CBG: No  results for input(s): GLUCAP in the last 168 hours. Lipid Profile: No results for input(s): CHOL, HDL, LDLCALC, TRIG, CHOLHDL, LDLDIRECT in the last 72 hours. Thyroid Function Tests: Recent Labs    10/07/17 1600  TSH 3.178   Anemia Panel: No results for input(s): VITAMINB12, FOLATE, FERRITIN, TIBC, IRON, RETICCTPCT in the last 72 hours. Urine analysis: No results found for:  COLORURINE, APPEARANCEUR, LABSPEC, PHURINE, GLUCOSEU, HGBUR, BILIRUBINUR, KETONESUR, PROTEINUR, UROBILINOGEN, NITRITE, LEUKOCYTESUR Sepsis Labs: @LABRCNTIP (procalcitonin:4,lacticidven:4)  ) Recent Results (from the past 240 hour(s))  MRSA PCR Screening     Status: None   Collection Time: 10/08/17  2:51 AM  Result Value Ref Range Status   MRSA by PCR NEGATIVE NEGATIVE Final    Comment:        The GeneXpert MRSA Assay (FDA approved for NASAL specimens only), is one component of a comprehensive MRSA colonization surveillance program. It is not intended to diagnose MRSA infection nor to guide or monitor treatment for MRSA infections. Performed at [facility], [address], [place], [place] [address]          Radiology Studies: Dg Chest 2 View  Result Date: 10/07/2017 CLINICAL DATA:  Chest pain radiating to the abdomen EXAM: CHEST - 2 VIEW COMPARISON:  September 23, 2017 FINDINGS: The heart size and mediastinal contours are within normal limits. There is no focal infiltrate, pulmonary edema, or pleural effusion. Prior vertebral plasties are identified in the spine. IMPRESSION: No active cardiopulmonary disease. Electronically Signed   By: Sherian Rein M.D.   On: 10/07/2017 15:39   Ct Angio Chest Pe W And/or Wo Contrast  Result Date: 10/07/2017 CLINICAL DATA:  67 year old female with a history of weakness and chest pain EXAM: CT ANGIOGRAPHY CHEST WITH CONTRAST TECHNIQUE: Multidetector CT imaging of the chest was performed using the standard protocol during bolus administration of intravenous contrast. Multiplanar CT image reconstructions and MIPs were obtained to evaluate the vascular anatomy. CONTRAST:  ISOVUE-370 IOPAMIDOL (ISOVUE-370) INJECTION 76% COMPARISON:  03/18/2017 FINDINGS: Cardiovascular: Heart: No cardiomegaly. No pericardial fluid/thickening. No significant coronary calcifications. Aorta: Unremarkable course, caliber, contour of the thoracic aorta. No aneurysm or  dissection flap. No periaortic fluid. Minimal calcifications of the aortic arch. Branch vessels are patent including the cervical cerebral vessels at the neck base. Pulmonary arteries: No central, lobar, segmental, or proximal subsegmental filling defects. Distal evaluation is limited by the contrast bolus timing and motion artifact. Mediastinum/Nodes: No mediastinal adenopathy. Unremarkable appearance of the thoracic esophagus. Hiatal hernia. Unremarkable appearance of the thoracic inlet Lungs/Pleura: Central airways are clear. No pleural effusion. No confluent airspace disease. Motion artifact particularly at the lung bases.  No pneumothorax. Upper Abdomen: No acute finding of the upper abdomen. Musculoskeletal: No acute displaced fracture. Degenerative changes of the spine including vacuum disc phenomenon at the lower thoracic levels. Changes of prior vertebral augmentation of T12 and L1. Review of the MIP images confirms the above findings. IMPRESSION: CT negative for pulmonary emboli. No acute CT finding to account for chest pain. Hiatal hernia. Aortic Atherosclerosis (ICD10-I70.0). Electronically Signed   By: Gilmer Mor D.O.   On: 10/07/2017 17:20        Scheduled Meds: . apixaban  5 mg Oral BID  . budesonide  0.25 mg Nebulization BID  . ipratropium-albuterol  3 mL Nebulization BID  . levothyroxine  50 mcg Oral QAC breakfast  . loratadine  10 mg Oral Daily  . LORazepam  0.5 mg Oral Once  . [START ON 10/09/2017] metoprolol succinate  25 mg Oral QHS  . metoprolol tartrate  50 mg Oral Once  . montelukast  10 mg Oral QHS  . pantoprazole  40 mg Oral Daily   Continuous Infusions:   LOS: 0 days    Time spent: 35min    Zannie CovePreetha Purity Irmen, MD [facility] Page via [email or web address], password [email or web address] After 7PM please contact night-coverage  10/08/2017, 1:34 PM

## 2017-10-08 NOTE — Progress Notes (Signed)
  Echocardiogram 2D Echocardiogram has been performed.  10/08/2017, 2:07 PM

## 2017-10-09 ENCOUNTER — Observation Stay (HOSPITAL_COMMUNITY): Payer: PPO

## 2017-10-09 DIAGNOSIS — I2699 Other pulmonary embolism without acute cor pulmonale: Secondary | ICD-10-CM | POA: Diagnosis not present

## 2017-10-09 DIAGNOSIS — E039 Hypothyroidism, unspecified: Secondary | ICD-10-CM

## 2017-10-09 DIAGNOSIS — R0789 Other chest pain: Secondary | ICD-10-CM | POA: Diagnosis not present

## 2017-10-09 DIAGNOSIS — R000 Tachycardia, unspecified: Secondary | ICD-10-CM | POA: Diagnosis not present

## 2017-10-09 DIAGNOSIS — E559 Vitamin D deficiency, unspecified: Secondary | ICD-10-CM | POA: Diagnosis not present

## 2017-10-09 DIAGNOSIS — R072 Precordial pain: Secondary | ICD-10-CM | POA: Diagnosis not present

## 2017-10-09 DIAGNOSIS — J45909 Unspecified asthma, uncomplicated: Secondary | ICD-10-CM | POA: Diagnosis not present

## 2017-10-09 DIAGNOSIS — I2782 Chronic pulmonary embolism: Secondary | ICD-10-CM | POA: Diagnosis not present

## 2017-10-09 DIAGNOSIS — E669 Obesity, unspecified: Secondary | ICD-10-CM | POA: Diagnosis not present

## 2017-10-09 DIAGNOSIS — K219 Gastro-esophageal reflux disease without esophagitis: Secondary | ICD-10-CM | POA: Diagnosis not present

## 2017-10-09 DIAGNOSIS — R0600 Dyspnea, unspecified: Secondary | ICD-10-CM | POA: Diagnosis not present

## 2017-10-09 DIAGNOSIS — I272 Pulmonary hypertension, unspecified: Secondary | ICD-10-CM | POA: Diagnosis not present

## 2017-10-09 DIAGNOSIS — G4733 Obstructive sleep apnea (adult) (pediatric): Secondary | ICD-10-CM | POA: Diagnosis not present

## 2017-10-09 DIAGNOSIS — R0689 Other abnormalities of breathing: Secondary | ICD-10-CM | POA: Diagnosis not present

## 2017-10-09 DIAGNOSIS — I2721 Secondary pulmonary arterial hypertension: Secondary | ICD-10-CM | POA: Diagnosis not present

## 2017-10-09 LAB — GLUCOSE, CAPILLARY: GLUCOSE-CAPILLARY: 110 mg/dL — AB (ref 70–99)

## 2017-10-09 MED ORDER — METOPROLOL TARTRATE 5 MG/5ML IV SOLN
INTRAVENOUS | Status: AC
  Filled 2017-10-09: qty 5

## 2017-10-09 MED ORDER — METOPROLOL TARTRATE 5 MG/5ML IV SOLN
5.0000 mg | Freq: Once | INTRAVENOUS | Status: AC
  Administered 2017-10-09: 5 mg via INTRAVENOUS

## 2017-10-09 MED ORDER — NITROGLYCERIN 0.4 MG SL SUBL
0.8000 mg | SUBLINGUAL_TABLET | Freq: Once | SUBLINGUAL | Status: AC
  Administered 2017-10-09: 0.8 mg via SUBLINGUAL

## 2017-10-09 MED ORDER — METOPROLOL TARTRATE 50 MG PO TABS
100.0000 mg | ORAL_TABLET | Freq: Once | ORAL | Status: AC
  Administered 2017-10-09: 50 mg via ORAL
  Filled 2017-10-09: qty 2

## 2017-10-09 MED ORDER — IOPAMIDOL (ISOVUE-370) INJECTION 76%
100.0000 mL | Freq: Once | INTRAVENOUS | Status: AC | PRN
  Administered 2017-10-09: 100 mL via INTRAVENOUS

## 2017-10-09 MED ORDER — METOPROLOL TARTRATE 50 MG PO TABS: 50.0000 mg | ORAL_TABLET | Freq: Two times a day (BID) | ORAL | Status: DC

## 2017-10-09 MED ORDER — NITROGLYCERIN 0.4 MG SL SUBL
SUBLINGUAL_TABLET | SUBLINGUAL | Status: AC
  Filled 2017-10-09: qty 2

## 2017-10-09 MED ORDER — METOPROLOL TARTRATE 25 MG PO TABS
25.0000 mg | ORAL_TABLET | Freq: Once | ORAL | Status: AC
  Administered 2017-10-09: 25 mg via ORAL
  Filled 2017-10-09: qty 1

## 2017-10-09 MED ORDER — METOPROLOL TARTRATE 50 MG PO TABS
50.0000 mg | ORAL_TABLET | Freq: Two times a day (BID) | ORAL | Status: DC
  Administered 2017-10-09: 50 mg via ORAL
  Filled 2017-10-09: qty 1

## 2017-10-09 NOTE — Progress Notes (Signed)
Set up CPAP, however pt refused to use any of masks available to her due to the fact that she wears nasal pillows at home and states she has severe claustrophobia and would not be able to tolerate a mask that sets on her face. Educated pt to bring home unit and/or mask and would be able to set up to satisfy pt needs and comfort pt agreed and said that she would be okay or the night. Will cont to monitor.

## 2017-10-09 NOTE — Progress Notes (Addendum)
Progress Note  Patient Name: Gail Olson Date of Encounter: 10/09/2017  Primary Cardiologist: No primary care provider on file.   Subjective   No more chest pain overnight but felt SOB and light headed this am.  Inpatient Medications    Scheduled Meds: . apixaban  5 mg Oral BID  . budesonide  0.25 mg Nebulization BID  . ipratropium-albuterol  3 mL Nebulization BID  . levothyroxine  50 mcg Oral QAC breakfast  . loratadine  10 mg Oral Daily  . LORazepam  0.5 mg Oral Once  . metoprolol succinate  25 mg Oral QHS  . metoprolol tartrate  50 mg Oral Once  . montelukast  10 mg Oral QHS  . pantoprazole  40 mg Oral Daily   Continuous Infusions:  PRN Meds: acetaminophen, HYDROcodone-acetaminophen, levalbuterol, methocarbamol, metoprolol tartrate, morphine injection, nitroGLYCERIN, ondansetron (ZOFRAN) IV   Vital Signs    Vitals:   10/08/17 2303 10/09/17 0317 10/09/17 0750 10/09/17 0757  BP: 121/68 (!) 141/74    Pulse: (!) 104 79    Resp: 19 14    Temp: (!) 97.5 F (36.4 C) (!) 97.5 F (36.4 C)    TempSrc: Oral Oral    SpO2: 95% 96% 100% 99%  Weight:      Height:        Intake/Output Summary (Last 24 hours) at 10/09/2017 0824 Last data filed at 10/08/2017 1225 Gross per 24 hour  Intake 791.25 ml  Output -  Net 791.25 ml   Filed Weights   10/07/17 1456 10/07/17 2328  Weight: 108.9 kg 98.1 kg    Telemetry    SR - Personally Reviewed  ECG    SR - Personally Reviewed  Physical Exam   GEN: No acute distress.   Neck: No JVD Cardiac: RRR, no murmurs, rubs, or gallops.  Respiratory: Clear to auscultation bilaterally. GI: Soft, nontender, non-distended  MS: No edema; No deformity. Neuro:  Nonfocal  Psych: Normal affect   Labs    Chemistry Recent Labs  Lab 10/07/17 1600  NA 138  K 3.6  CL 105  CO2 25  GLUCOSE 158*  BUN 15  CREATININE 0.93  CALCIUM 8.7*  PROT 7.2  ALBUMIN 3.8  AST 26  ALT 23  ALKPHOS 69  BILITOT 0.7  GFRNONAA >60  GFRAA  >60  ANIONGAP 8     Hematology Recent Labs  Lab 10/07/17 1501  WBC 7.2  RBC 4.90  HGB 14.7  HCT 43.8  MCV 89.4  MCH 30.0  MCHC 33.6  RDW 13.5  PLT 218    Cardiac Enzymes Recent Labs  Lab 10/07/17 1815 10/08/17 0038 10/08/17 0239 10/08/17 0634  TROPONINI <0.03 <0.03 <0.03 <0.03   No results for input(s): TROPIPOC in the last 168 hours.   BNP Recent Labs  Lab 10/07/17 1501  BNP 19.0     DDimer No results for input(s): DDIMER in the last 168 hours.   Radiology    Dg Chest 2 View  Result Date: 10/07/2017 CLINICAL DATA:  Chest pain radiating to the abdomen EXAM: CHEST - 2 VIEW COMPARISON:  September 23, 2017 FINDINGS: The heart size and mediastinal contours are within normal limits. There is no focal infiltrate, pulmonary edema, or pleural effusion. Prior vertebral plasties are identified in the spine. IMPRESSION: No active cardiopulmonary disease. Electronically Signed   By: Sherian Rein M.D.   On: 10/07/2017 15:39   Ct Angio Chest Pe W And/or Wo Contrast  Result Date: 10/07/2017 CLINICAL DATA:  67 year old female with a history of weakness and chest pain EXAM: CT ANGIOGRAPHY CHEST WITH CONTRAST TECHNIQUE: Multidetector CT imaging of the chest was performed using the standard protocol during bolus administration of intravenous contrast. Multiplanar CT image reconstructions and MIPs were obtained to evaluate the vascular anatomy. CONTRAST:  100mL ISOVUE-370 IOPAMIDOL (ISOVUE-370) INJECTION 76% COMPARISON:  03/18/2017 FINDINGS: Cardiovascular: Heart: No cardiomegaly. No pericardial fluid/thickening. No significant coronary calcifications. Aorta: Unremarkable course, caliber, contour of the thoracic aorta. No aneurysm or dissection flap. No periaortic fluid. Minimal calcifications of the aortic arch. Branch vessels are patent including the cervical cerebral vessels at the neck base. Pulmonary arteries: No central, lobar, segmental, or proximal subsegmental filling defects. Distal  evaluation is limited by the contrast bolus timing and motion artifact. Mediastinum/Nodes: No mediastinal adenopathy. Unremarkable appearance of the thoracic esophagus. Hiatal hernia. Unremarkable appearance of the thoracic inlet Lungs/Pleura: Central airways are clear. No pleural effusion. No confluent airspace disease. Motion artifact particularly at the lung bases.  No pneumothorax. Upper Abdomen: No acute finding of the upper abdomen. Musculoskeletal: No acute displaced fracture. Degenerative changes of the spine including vacuum disc phenomenon at the lower thoracic levels. Changes of prior vertebral augmentation of T12 and L1. Review of the MIP images confirms the above findings. IMPRESSION: CT negative for pulmonary emboli. No acute CT finding to account for chest pain. Hiatal hernia. Aortic Atherosclerosis (ICD10-I70.0). Electronically Signed   By: Gilmer MorJaime  Wagner D.O.   On: 10/07/2017 17:20    Cardiac Studies    Patient Profile     67 y.o. female with h/o FH of early CAD (father died of MI at age 69), with progressively worsening DOE, also acute episodes of CP and diaphoresis, prior stress test about 5 years ago, poor reaction to adenosine vs lexiscan, report not available, echo last year LVEF 60-65%, G1 DD, mildly dilated RV with mild systolic dysfunction, moderate pulmonary hypertension, on Eliquis for DVT/PE, repeat CT yesterday no PE. Ruled out for ACS. She is tachycardic and obese. No CHF on physical exam.   Assessment & Plan    1.  Atypical chest pain/palpitations:  2. PE/DVT:  3. Hypothyroidism   Not a good candidate for a stress test, suboptimal for CTA given high BMI and tachycardia, however might be the best test, I don't want to send her straight to the cath lab. ECG unchanged from prior in January 2019, just increased HR. She is awaiting coronary CTA. She is tachycardic most of the time, feels SOB, her lungs are clear, I will increase her baseline metoprolol to 50 mg PO BID. CTA  today, if no obstructive CAD, I feel that anxiety and deconditioning plays a role here.   For questions or updates, please contact [facility] Please consult [email or web address] for contact info under Cardiology/STEMI.      Signed, Tobias AlexanderKatarina Hoang Reich, MD  10/09/2017, 8:24 AM

## 2017-10-09 NOTE — Progress Notes (Addendum)
PROGRESS NOTE    Gail Olson  ZOX:[id number] DOB: 04-25-1950 DOA: 10/07/2017 PCP: Shellia Cleverly, PA  Brief Narrative: 67 year old female with history of obesity,DVT/PE on Eliquis, arthritis and hypothyroidism presented to the ED last night with palpitations and chest pressure intermittently for 2 weeks  Assessment & Plan:     Chest pain/palpitations -EKG noted sinus tachycardia, nonspecific ST changes -Cardiac enzymes negative -history of cardiac stress test 4 years ago which was unremarkable -Cardiology following, 2-D echocardiogram with preserved EF and wall motion, no significant valvular heart disease, PA pressures not reported -continue metoprolol, TSH is okay -To undergo evaluation with cardiac CT today -suspect beta agonists for her asthma could be contributing to some extent to her palpitations  History of DVT/PE -This was diagnosed 01/2017 -Continue full dose anticoagulation with Eliquis -Repeat CTA was negative for PE  Hypothyroidism -Continue Synthroid  OSA/obesity/pulmonary hypertension -Continue C Pap   GERD -takes prevacid for this  Asthma -stable, FU with Pulm Dr.Sood  DVT prophylaxis: on ELiquis Code Status: full code Family Communication:husband at bedside Disposition Plan: home pending cardiac workup  Consultants:   cardiology   Procedures:   Antimicrobials:    Subjective: -reports dyspnea and intermittent palpitations, denies any further chest pain  Objective: Vitals:   10/09/17 0842 10/09/17 0947 10/09/17 1127 10/09/17 1155  BP:   124/66 118/71  Pulse: (!) 108 (!) 112 81   Resp:   14   Temp:   98 F (36.7 C)   TempSrc:   Oral   SpO2:   99%   Weight:      Height:        Intake/Output Summary (Last 24 hours) at 10/09/2017 1301 Last data filed at 10/09/2017 1100 Gross per 24 hour  Intake 0 ml  Output -  Net 0 ml   Filed Weights   10/07/17 1456 10/07/17 2328  Weight: 108.9 kg 98.1 kg    Examination:  Gen: Awake, Alert,  Oriented X 3, obese anxious female HEENT: PERRLA, Neck supple, no JVD Lungs: decreased breath sounds at both bases CVS: RRR,No Gallops,Rubs or new Murmurs Abd: soft, Non tender, non distended, BS present Extremities: No Cyanosis, Clubbing or edema Skin: no new rashes Psychiatry: Judgement and insight appear normal, very anxious    Data Reviewed:   CBC: Recent Labs  Lab 10/07/17 1501  WBC 7.2  NEUTROABS 4.0  HGB 14.7  HCT 43.8  MCV 89.4  PLT 218   Basic Metabolic Panel: Recent Labs  Lab 10/07/17 1600  NA 138  K 3.6  CL 105  CO2 25  GLUCOSE 158*  BUN 15  CREATININE 0.93  CALCIUM 8.7*   GFR: Estimated Creatinine Clearance: 68.7 mL/min (by C-G formula based on SCr of 0.93 mg/dL). Liver Function Tests: Recent Labs  Lab 10/07/17 1600  AST 26  ALT 23  ALKPHOS 69  BILITOT 0.7  PROT 7.2  ALBUMIN 3.8   No results for input(s): LIPASE, AMYLASE in the last 168 hours. No results for input(s): AMMONIA in the last 168 hours. Coagulation Profile: No results for input(s): INR, PROTIME in the last 168 hours. Cardiac Enzymes: Recent Labs  Lab 10/07/17 1501 10/07/17 1815 10/08/17 0038 10/08/17 0239 10/08/17 0634  TROPONINI <0.03 <0.03 <0.03 <0.03 <0.03   BNP (last 3 results) No results for input(s): PROBNP in the last 8760 hours. HbA1C: No results for input(s): HGBA1C in the last 72 hours. CBG: Recent Labs  Lab 10/09/17 0845  GLUCAP 110*   Lipid Profile: No results  for input(s): CHOL, HDL, LDLCALC, TRIG, CHOLHDL, LDLDIRECT in the last 72 hours. Thyroid Function Tests: Recent Labs    10/07/17 1600  TSH 3.178   Anemia Panel: No results for input(s): VITAMINB12, FOLATE, FERRITIN, TIBC, IRON, RETICCTPCT in the last 72 hours. Urine analysis: No results found for: COLORURINE, APPEARANCEUR, LABSPEC, PHURINE, GLUCOSEU, HGBUR, BILIRUBINUR, KETONESUR, PROTEINUR, UROBILINOGEN, NITRITE, LEUKOCYTESUR Sepsis  Labs: @LABRCNTIP (procalcitonin:4,lacticidven:4)  ) Recent Results (from the past 240 hour(s))  MRSA PCR Screening     Status: None   Collection Time: 10/08/17  2:51 AM  Result Value Ref Range Status   MRSA by PCR NEGATIVE NEGATIVE Final    Comment:        The GeneXpert MRSA Assay (FDA approved for NASAL specimens only), is one component of a comprehensive MRSA colonization surveillance program. It is not intended to diagnose MRSA infection nor to guide or monitor treatment for MRSA infections. Performed at [facility], [address] [place], [place] [address]          Radiology Studies: Dg Chest 2 View  Result Date: 10/07/2017 CLINICAL DATA:  Chest pain radiating to the abdomen EXAM: CHEST - 2 VIEW COMPARISON:  September 23, 2017 FINDINGS: The heart size and mediastinal contours are within normal limits. There is no focal infiltrate, pulmonary edema, or pleural effusion. Prior vertebral plasties are identified in the spine. IMPRESSION: No active cardiopulmonary disease. Electronically Signed   By: Sherian ReinWei-Chen  Lin M.D.   On: 10/07/2017 15:39   Ct Angio Chest Pe W And/or Wo Contrast  Result Date: 10/07/2017 CLINICAL DATA:  67 year old female with a history of weakness and chest pain EXAM: CT ANGIOGRAPHY CHEST WITH CONTRAST TECHNIQUE: Multidetector CT imaging of the chest was performed using the standard protocol during bolus administration of intravenous contrast. Multiplanar CT image reconstructions and MIPs were obtained to evaluate the vascular anatomy. CONTRAST:  100mL ISOVUE-370 IOPAMIDOL (ISOVUE-370) INJECTION 76% COMPARISON:  03/18/2017 FINDINGS: Cardiovascular: Heart: No cardiomegaly. No pericardial fluid/thickening. No significant coronary calcifications. Aorta: Unremarkable course, caliber, contour of the thoracic aorta. No aneurysm or dissection flap. No periaortic fluid. Minimal calcifications of the aortic arch. Branch vessels are patent including the cervical cerebral  vessels at the neck base. Pulmonary arteries: No central, lobar, segmental, or proximal subsegmental filling defects. Distal evaluation is limited by the contrast bolus timing and motion artifact. Mediastinum/Nodes: No mediastinal adenopathy. Unremarkable appearance of the thoracic esophagus. Hiatal hernia. Unremarkable appearance of the thoracic inlet Lungs/Pleura: Central airways are clear. No pleural effusion. No confluent airspace disease. Motion artifact particularly at the lung bases.  No pneumothorax. Upper Abdomen: No acute finding of the upper abdomen. Musculoskeletal: No acute displaced fracture. Degenerative changes of the spine including vacuum disc phenomenon at the lower thoracic levels. Changes of prior vertebral augmentation of T12 and L1. Review of the MIP images confirms the above findings. IMPRESSION: CT negative for pulmonary emboli. No acute CT finding to account for chest pain. Hiatal hernia. Aortic Atherosclerosis (ICD10-I70.0). Electronically Signed   By: Gilmer MorJaime  Wagner D.O.   On: 10/07/2017 17:20        Scheduled Meds: . apixaban  5 mg Oral BID  . budesonide  0.25 mg Nebulization BID  . ipratropium-albuterol  3 mL Nebulization BID  . levothyroxine  50 mcg Oral QAC breakfast  . loratadine  10 mg Oral Daily  . metoprolol tartrate  50 mg Oral BID  . montelukast  10 mg Oral QHS  . pantoprazole  40 mg Oral Daily   Continuous  Infusions:   LOS: 0 days    Time spent:    Zannie Cove, MD [facility] Page via [email or web address], password [email or web address] After 7PM please contact night-coverage  10/09/2017, 1:01 PM

## 2017-10-09 NOTE — Progress Notes (Signed)
RN and RT in agreement for pt to not receive tx this evening due to pt heart rate increasing because of albuterol. Pt has a PRN xopenex available for distress or wheezing and pt resting comfortably in no distress at time of assessment, pt eating. Will cont to monitor. Pt refuses to wear CPAP because of claustrophobia and hospital availability of masks.

## 2017-10-10 DIAGNOSIS — R0689 Other abnormalities of breathing: Secondary | ICD-10-CM | POA: Diagnosis not present

## 2017-10-10 DIAGNOSIS — K219 Gastro-esophageal reflux disease without esophagitis: Secondary | ICD-10-CM | POA: Diagnosis not present

## 2017-10-10 DIAGNOSIS — I2721 Secondary pulmonary arterial hypertension: Secondary | ICD-10-CM | POA: Diagnosis not present

## 2017-10-10 DIAGNOSIS — G4733 Obstructive sleep apnea (adult) (pediatric): Secondary | ICD-10-CM | POA: Diagnosis not present

## 2017-10-10 DIAGNOSIS — R0600 Dyspnea, unspecified: Secondary | ICD-10-CM | POA: Diagnosis not present

## 2017-10-10 DIAGNOSIS — R0789 Other chest pain: Secondary | ICD-10-CM | POA: Diagnosis not present

## 2017-10-10 DIAGNOSIS — E039 Hypothyroidism, unspecified: Secondary | ICD-10-CM | POA: Diagnosis not present

## 2017-10-10 MED ORDER — METOPROLOL SUCCINATE ER 50 MG PO TB24: 50.0000 mg | ORAL_TABLET | Freq: Every day | ORAL | 0 refills | Status: DC

## 2017-10-10 MED ORDER — METOPROLOL SUCCINATE ER 50 MG PO TB24
50.0000 mg | ORAL_TABLET | Freq: Every day | ORAL | Status: DC
  Administered 2017-10-10: 50 mg via ORAL
  Filled 2017-10-10: qty 1

## 2017-10-10 MED ORDER — LEVALBUTEROL HCL 0.63 MG/3ML IN NEBU: 0.6300 mg | INHALATION_SOLUTION | RESPIRATORY_TRACT | 1 refills | Status: DC | PRN

## 2017-10-12 ENCOUNTER — Other Ambulatory Visit: Payer: Self-pay

## 2017-10-12 ENCOUNTER — Telehealth: Payer: Self-pay | Admitting: Cardiology

## 2017-10-12 MED ORDER — METOPROLOL TARTRATE 50 MG PO TABS: 50.0000 mg | ORAL_TABLET | Freq: Two times a day (BID) | ORAL | 2 refills | Status: DC

## 2017-10-12 NOTE — Telephone Encounter (Signed)
Patient states that she had her Metoprolol changed while she was in the hospital and that the new directions and strength did not get called in, can we please call to the [facility] in [place] and please call patient regarding this.

## 2017-10-12 NOTE — Telephone Encounter (Signed)
Order for new metoprolol dose according to ED visit was sent to the pharmacy. Appointment post hospital visit was moved up.

## 2017-10-21 NOTE — Discharge Summary (Signed)
Physician Discharge Summary  Gail Olson WGN:[id number] DOB: [birth year] DOA: 10/07/2017  PCP: Gail Cleverly, PA  Admit date: 10/07/2017 Discharge date: 10/10/2017  Time spent: 35 minutes  Recommendations for Outpatient Follow-up:  1. PCP in 1week 2. Cardiology Dr.Robert Bing Matter in 38month   Discharge Diagnoses:  Active Problems:   Pulmonary embolism (HCC)   Hypothyroidism   Atypical chest pain   Dyspnea and respiratory abnormalities   Sinus tachycardia   Gastroesophageal reflux disease without esophagitis   OSA (obstructive sleep apnea)   Pulmonary artery hypertension (HCC)   Precordial chest pain   Discharge Condition: stable  Diet recommendation: low sodium heart healthy  Filed Weights   10/07/17 1456 10/07/17 2328  Weight: 108.9 kg 98.1 kg    History of present illness:  67 year old female with history of obesity,DVT/PE on Eliquis, arthritis and hypothyroidism presented to the ED last night with palpitations and chest pressure intermittently for 2 weeks  Hospital Course:  Chest pain/palpitations -EKG noted sinus tachycardia, nonspecific ST changes -Cardiac enzymes negative -history of cardiac stress test 4 years ago which was unremarkable -Cardiology consulted, 2-D echocardiogram noted with preserved EF and wall motion, no significant valvular heart disease, PA pressures not reported -continued on metoprolol dose increased, TSH is okay -Underwent evaluation with cardiac CT which showed Ca score of 0, minimal nonobstructive CAD -suspect beta agonists for her asthma could be contributing to some extent to her palpitations -doing better on increased BB dose, discharged home to FU with Dr.Krasowski in 1 month  History of DVT/PE -This was diagnosed 01/2017 -Continue full dose anticoagulation with Eliquis -Repeat CTA was negative for PE  Hypothyroidism -Continue Synthroid  OSA/obesity/pulmonary hypertension -Continue C Pap   GERD -takes prevacid for  this  Asthma -stable, FU with Pulm Dr.Sood   Discharge Exam: Vitals:   10/10/17 0400 10/10/17 0736  BP: (!) 117/58 105/82  Pulse: 82 74  Resp: 17 18  Temp: 98 F (36.7 C) 98 F (36.7 C)  SpO2: 93% 96%    General: AAOx3 Cardiovascular:S1S2/RRR Respiratory: CTAB  Discharge Instructions   Discharge Instructions    Diet - low sodium heart healthy   Complete by:  As directed    Increase activity slowly   Complete by:  As directed      Allergies as of 10/10/2017      Reactions   Nsaids Other (See Comments)   Not suppose to take with her blood thinner   Other Other (See Comments)   Extreme stomach pain if any of these are eaten: spicy foods, onions, fried foods, cauliflower, and broccoli   Prednisone Other (See Comments)   Not suppose to take due to advanced osteoporosis   Risedronate Other (See Comments)   (Actonel) Caused EXTREME JAW PAIN and muscles and joints ached   Adhesive [tape] Rash, Other (See Comments)   EKG pads left welts on the skin   Codeine Palpitations, Other (See Comments)   Tachycardia   Penicillins Rash   Has patient had a PCN reaction causing immediate rash, facial/tongue/throat swelling, SOB or lightheadedness with hypotension: Yes Has patient had a PCN reaction causing severe rash involving mucus membranes or skin necrosis: Unk Has patient had a PCN reaction that required hospitalization: Unk Has patient had a PCN reaction occurring within the last 10 years: Unk If all of the above answers are "NO", then may proceed with Cephalosporin use.      Medication List    STOP taking these medications   fluticasone 110 MCG/ACT inhaler  Commonly known as:  FLOVENT HFA   meclizine 25 MG tablet Commonly known as:  ANTIVERT   metoprolol succinate 25 MG 24 hr tablet Commonly known as:  TOPROL-XL     TAKE these medications   acetaminophen 650 MG CR tablet Commonly known as:  TYLENOL Take 650 mg by mouth every 8 (eight) hours as needed for pain.    AEROCHAMBER MV inhaler Use as instructed   apixaban 5 MG Tabs tablet Commonly known as:  ELIQUIS Take 1 tablet (5 mg total) by mouth 2 (two) times daily.   benzonatate 200 MG capsule Commonly known as:  TESSALON Take 1 capsule (200 mg total) by mouth 3 (three) times daily as needed for cough.   betamethasone dipropionate 0.05 % ointment Commonly known as:  DIPROLENE Apply 1 application topically 2 (two) times daily as needed (to affected areas).   Black Cohosh 20 MG Tabs Take 20 mg by mouth 3 (three) times a week.   CORICIDIN HBP PO Take 1 tablet by mouth as needed (cold symptoms).   fluticasone 50 MCG/ACT nasal spray Commonly known as:  FLONASE PLACE 2 SPRAYS INTO BOTH NOSTRILS AT BEDTIME AS NEEDED FOR ALLERGIES OR RHINITIS. What changed:  when to take this   hydrochlorothiazide 12.5 MG capsule Commonly known as:  MICROZIDE Take 12.5 mg by mouth daily as needed for edema or fluid.   HYDROcodone-acetaminophen 5-325 MG tablet Commonly known as:  NORCO/VICODIN Take 1 tablet by mouth every 8 (eight) hours as needed for pain.   ketotifen 0.025 % ophthalmic solution Commonly known as:  ZADITOR Place 1 drop into both eyes as needed (allergy symptoms).   levalbuterol 0.63 MG/3ML nebulizer solution Commonly known as:  XOPENEX Take 3 mLs (0.63 mg total) by nebulization every 4 (four) hours as needed for wheezing or shortness of breath.   levothyroxine 75 MCG tablet Commonly known as:  SYNTHROID, LEVOTHROID Take 75 mcg by mouth every Tuesday, Thursday, Saturday, and Sunday.   levothyroxine 50 MCG tablet Commonly known as:  SYNTHROID, LEVOTHROID Take 50 mcg by mouth every Monday, Wednesday, and Friday.   loratadine 10 MG tablet Commonly known as:  CLARITIN Take 1 tablet (10 mg total) by mouth daily.   methocarbamol 750 MG tablet Commonly known as:  ROBAXIN Take 750 mg by mouth 3 (three) times daily as needed for muscle spasms.   montelukast 10 MG tablet Commonly  known as:  SINGULAIR TAKE 1 TABLET BY MOUTH EVERYDAY AT BEDTIME What changed:  See the new instructions.   nystatin 100000 UNIT/ML suspension Commonly known as:  MYCOSTATIN Take 5 mLs (500,000 Units total) by mouth 4 (four) times daily.   omeprazole 20 MG capsule Commonly known as:  PRILOSEC Take 20 mg by mouth daily as needed (for reflux).   OVER THE COUNTER MEDICATION Remefemin tablets: Take 1 tablet by mouth two to three times a week   PROAIR HFA 108 (90 Base) MCG/ACT inhaler Generic drug:  albuterol Inhale 2 puffs into the lungs See admin instructions. 2 puffs every four to six hours as needed for shortness of breath What changed:  Another medication with the same name was removed. Continue taking this medication, and follow the directions you see here.   PROBIOTIC ACIDOPHILUS PO Take 1 capsule by mouth daily.   UNABLE TO FIND Compression stockings, daily   Vitamin D (Ergocalciferol) 50000 units Caps capsule Commonly known as:  DRISDOL Take 50,000 Units by mouth once a week.      Allergies  Allergen Reactions  .  Nsaids Other (See Comments)    Not suppose to take with her blood thinner  . Other Other (See Comments)    Extreme stomach pain if any of these are eaten: spicy foods, onions, fried foods, cauliflower, and broccoli  . Prednisone Other (See Comments)    Not suppose to take due to advanced osteoporosis  . Risedronate Other (See Comments)    (Actonel) Caused EXTREME JAW PAIN and muscles and joints ached   . Adhesive [Tape] Rash and Other (See Comments)    EKG pads left welts on the skin  . Codeine Palpitations and Other (See Comments)    Tachycardia   . Penicillins Rash    Has patient had a PCN reaction causing immediate rash, facial/tongue/throat swelling, SOB or lightheadedness with hypotension: Yes Has patient had a PCN reaction causing severe rash involving mucus membranes or skin necrosis: Unk Has patient had a PCN reaction that required  hospitalization: Unk Has patient had a PCN reaction occurring within the last 10 years: Unk If all of the above answers are "NO", then may proceed with Cephalosporin use.    Follow-up Information    Gail Olson, [place]. Schedule an appointment as soon as possible for a visit in 1 week(s).   Specialty:  General Practice Contact information: [address] [place] [address] [phone]        Georgeanna Lea, MD. Schedule an appointment as soon as possible for a visit in 1 month(s).   Specialty:  Cardiology Contact information: [address] [place] [address] [phone]            The results of significant diagnostics from this hospitalization (including imaging, microbiology, ancillary and laboratory) are listed below for reference.    Significant Diagnostic Studies: Dg Chest 2 View  Result Date: 10/07/2017 CLINICAL DATA:  Chest pain radiating to the abdomen EXAM: CHEST - 2 VIEW COMPARISON:  September 23, 2017 FINDINGS: The heart size and mediastinal contours are within normal limits. There is no focal infiltrate, pulmonary edema, or pleural effusion. Prior vertebral plasties are identified in the spine. IMPRESSION: No active cardiopulmonary disease. Electronically Signed   By: Sherian Rein M.D.   On: 10/07/2017 15:39   Dg Chest 2 View  Result Date: 09/23/2017 CLINICAL DATA:  Dyspnea on exertion. EXAM: CHEST - 2 VIEW COMPARISON:  CT chest 03/18/2017.  Chest two-view 03/18/2017 FINDINGS: Heart size and vascularity normal. Minimal bibasilar atelectasis. Negative for pneumonia or effusion Cement vertebroplasty for and chronic fractures T11 and T12. Chronic fractures T9 and T10 unchanged from the prior CT. IMPRESSION: Mild bibasilar atelectasis.  Chronic thoracic fractures. Electronically Signed   By: Marlan Palau M.D.   On: 09/23/2017 15:54   Ct Angio Chest Pe W And/or Wo Contrast  Result Date: 10/07/2017 CLINICAL DATA:  67 year old female with a history of  weakness and chest pain EXAM: CT ANGIOGRAPHY CHEST WITH CONTRAST TECHNIQUE: Multidetector CT imaging of the chest was performed using the standard protocol during bolus administration of intravenous contrast. Multiplanar CT image reconstructions and MIPs were obtained to evaluate the vascular anatomy. CONTRAST:  ISOVUE-370 IOPAMIDOL (ISOVUE-370) INJECTION 76% COMPARISON:  03/18/2017 FINDINGS: Cardiovascular: Heart: No cardiomegaly. No pericardial fluid/thickening. No significant coronary calcifications. Aorta: Unremarkable course, caliber, contour of the thoracic aorta. No aneurysm or dissection flap. No periaortic fluid. Minimal calcifications of the aortic arch. Branch vessels are patent including the cervical cerebral vessels at the neck base. Pulmonary arteries: No central, lobar, segmental, or proximal subsegmental filling defects.  Distal evaluation is limited by the contrast bolus timing and motion artifact. Mediastinum/Nodes: No mediastinal adenopathy. Unremarkable appearance of the thoracic esophagus. Hiatal hernia. Unremarkable appearance of the thoracic inlet Lungs/Pleura: Central airways are clear. No pleural effusion. No confluent airspace disease. Motion artifact particularly at the lung bases.  No pneumothorax. Upper Abdomen: No acute finding of the upper abdomen. Musculoskeletal: No acute displaced fracture. Degenerative changes of the spine including vacuum disc phenomenon at the lower thoracic levels. Changes of prior vertebral augmentation of T12 and L1. Review of the MIP images confirms the above findings. IMPRESSION: CT negative for pulmonary emboli. No acute CT finding to account for chest pain. Hiatal hernia. Aortic Atherosclerosis (ICD10-I70.0). Electronically Signed   By: Gilmer MorJaime  Wagner D.O.   On: 10/07/2017 17:20   Ct Coronary Morph W/cta Cor W/score W/ca W/cm &/or Wo/cm  Addendum Date: 10/09/2017   ADDENDUM REPORT: 10/09/2017 16:16 CONTRAST:  67 -year-old female with atypical chest  pain and DOE. EXAM: Cardiac/Coronary  CT TECHNIQUE: The patient was scanned on a [employer] scanner. FINDINGS: A 120 kV prospective scan was triggered in the descending thoracic aorta at 111 HU's. Axial non-contrast 3 mm slices were carried out through the heart. The data set was analyzed on a dedicated work station and scored using the Agatson method. Gantry rotation speed was 250 msecs and collimation was .6 mm. 150 mg of PO metoprolol and 0.8 mg of sl NTG was given. The 3D data set was reconstructed in 5% intervals of the 67-82 % of the R-R cycle. Diastolic phases were analyzed on a dedicated work station using MPR, MIP and VRT modes. The patient received 80 cc of contrast. Aorta:  Normal size.  No calcifications.  No dissection. Aortic Valve:  Trileaflet.  No calcifications. Coronary Arteries:  Normal coronary origin.  Right dominance. RCA is a large dominant artery that gives rise to PDA and PLVB. There is minimal diffuse non-calcified plaque. Left main is a large artery that gives rise to LAD and LCX arteries. Ostial left main has minimal calcified plaque associated with 0-25% stenosis. LAD is a medium size artery that gives rise to one diagonal artery, there is motion in the mid portion, otherwise only minimal non-calcified plaque. LCX is a non-dominant artery that gives rise to one OM1 branch. There is no plaque. Other findings: Normal pulmonary vein drainage into the left atrium. Normal let atrial appendage without a thrombus. Normal size of the pulmonary artery. IMPRESSION: 1. Coronary calcium score of 0. This was 0 percentile for age and sex matched control. 2. Normal coronary origin with right dominance. 3. Mild non-obstructive CAD. Electronically Signed   By: Tobias AlexanderKatarina  Nelson   On: 10/09/2017 16:16   Result Date: 10/09/2017 EXAM: OVER-READ INTERPRETATION CT CHEST The following report is an over-read performed by radiologist Dr. Hulan Saashomas Lawrence of [facility] on 10/09/2017. This over-read  does not include interpretation of cardiac or coronary anatomy or pathology. The coronary calcium score/coronary CTA interpretation by the cardiologist is attached. COMPARISON:  CTA for pulmonary embolism 10/07/2017, 03/18/2017 and earlier. FINDINGS: Vascular: No visible atherosclerosis and no evidence of aneurysm involving the visualized aorta. Central pulmonary arteries patent. Mediastinum/Nodes: No pathologic lymphadenopathy within the visualized mediastinum. Visualized esophagus normal in appearance. Lungs/Pleura: Mild atelectasis involving the BILATERAL lower lobes. Visualized lung parenchyma otherwise clear. Central airways patent without significant bronchial wall thickening. Upper Abdomen: Very small hiatal hernia as noted previously. Visualized upper abdomen otherwise unremarkable for the early arterial phase of enhancement. Musculoskeletal: Degenerative disc  disease and spondylosis involving the visualized thoracic spine. IMPRESSION: 1. Mild atelectasis involving the BILATERAL lower lobes. 2. Very small hiatal hernia as noted previously. Electronically Signed: By: Hulan Saashomas  Lawrence M.D. On: 10/09/2017 15:59    Microbiology: No results found for this or any previous visit (from the past 240 hour(s)).   Labs: Basic Metabolic Panel: No results for input(s): NA, K, CL, CO2, GLUCOSE, BUN, CREATININE, CALCIUM, MG, PHOS in the last 168 hours. Liver Function Tests: No results for input(s): AST, ALT, ALKPHOS, BILITOT, PROT, ALBUMIN in the last 168 hours. No results for input(s): LIPASE, AMYLASE in the last 168 hours. No results for input(s): AMMONIA in the last 168 hours. CBC: No results for input(s): WBC, NEUTROABS, HGB, HCT, MCV, PLT in the last 168 hours. Cardiac Enzymes: No results for input(s): CKTOTAL, CKMB, CKMBINDEX, TROPONINI in the last 168 hours. BNP: BNP (last 3 results) Recent Labs    02/26/17 2337 10/07/17 1501  BNP 13.9 19.0    ProBNP (last 3 results) No results for  input(s): PROBNP in the last 8760 hours.  CBG: No results for input(s): GLUCAP in the last 168 hours.     Signed:  Zannie CovePreetha Maikayla Beggs MD.  [facility] 10/21/2017, 4:45 PM

## 2017-10-22 DIAGNOSIS — J42 Unspecified chronic bronchitis: Secondary | ICD-10-CM | POA: Diagnosis not present

## 2017-10-22 DIAGNOSIS — J410 Simple chronic bronchitis: Secondary | ICD-10-CM | POA: Diagnosis not present

## 2017-10-22 DIAGNOSIS — I2782 Chronic pulmonary embolism: Secondary | ICD-10-CM | POA: Diagnosis not present

## 2017-10-22 DIAGNOSIS — G4733 Obstructive sleep apnea (adult) (pediatric): Secondary | ICD-10-CM | POA: Diagnosis not present

## 2017-10-27 DIAGNOSIS — R000 Tachycardia, unspecified: Secondary | ICD-10-CM | POA: Diagnosis not present

## 2017-10-27 DIAGNOSIS — R0602 Shortness of breath: Secondary | ICD-10-CM | POA: Diagnosis not present

## 2017-10-27 DIAGNOSIS — Z09 Encounter for follow-up examination after completed treatment for conditions other than malignant neoplasm: Secondary | ICD-10-CM | POA: Diagnosis not present

## 2017-10-27 DIAGNOSIS — J4540 Moderate persistent asthma, uncomplicated: Secondary | ICD-10-CM | POA: Diagnosis not present

## 2017-10-27 DIAGNOSIS — I10 Essential (primary) hypertension: Secondary | ICD-10-CM | POA: Diagnosis not present

## 2017-10-27 DIAGNOSIS — R7301 Impaired fasting glucose: Secondary | ICD-10-CM | POA: Diagnosis not present

## 2017-10-27 DIAGNOSIS — E039 Hypothyroidism, unspecified: Secondary | ICD-10-CM | POA: Diagnosis not present

## 2017-11-12 ENCOUNTER — Encounter: Payer: Self-pay | Admitting: Cardiology

## 2017-11-12 ENCOUNTER — Ambulatory Visit (INDEPENDENT_AMBULATORY_CARE_PROVIDER_SITE_OTHER): Payer: PPO | Admitting: Cardiology

## 2017-11-12 VITALS — BP 122/62 | HR 74 | Ht 66.0 in | Wt 247.2 lb

## 2017-11-12 DIAGNOSIS — G4733 Obstructive sleep apnea (adult) (pediatric): Secondary | ICD-10-CM | POA: Diagnosis not present

## 2017-11-12 DIAGNOSIS — I2782 Chronic pulmonary embolism: Secondary | ICD-10-CM

## 2017-11-12 DIAGNOSIS — R002 Palpitations: Secondary | ICD-10-CM

## 2017-11-12 DIAGNOSIS — R0789 Other chest pain: Secondary | ICD-10-CM

## 2017-11-12 NOTE — Progress Notes (Signed)
Cardiology Office Note:    Date:  11/12/2017   ID:  [name], DOB [birth year], MRN [id number]  PCP:  Shellia Cleverly, PA  Cardiologist:  Gypsy Balsam, MD    Referring MD: Shellia Cleverly, [place]   Chief Complaint  Patient presents with  . Hospitalization Follow-up  I was recently in the hospital  History of Present Illness:    Gail Olson is a 67 y.o. female with history of pulmonary emboli.  She ended up going to the hospital because of palpitation as well as atypical chest pain.  CT of her chest being done showed no PE.  She also had coronary angiogram CT done which showed no significant obstruction since that time she is feeling better she is doing well.  Shortness of breath improved she is able to walk 15 minutes 3 times a day with no difficulties.  Palpitations improved significantly.  Past Medical History:  Diagnosis Date  . Arthritis   . DVT (deep venous thrombosis) (HCC)   . Pulmonary embolism (HCC)   . Thyroid disease     Past Surgical History:  Procedure Laterality Date  . APPENDECTOMY    . BACK SURGERY    . CHOLECYSTECTOMY    . OVARY SURGERY      Current Medications: No outpatient medications have been marked as taking for the 11/12/17 encounter (Office Visit) with Georgeanna Lea, MD.     Allergies:   Nsaids; Other; Prednisone; Risedronate; Adhesive [tape]; Codeine; and Penicillins   Social History   Socioeconomic History  . Marital status: Married    Spouse name: Not on file  . Number of children: Not on file  . Years of education: Not on file  . Highest education level: Not on file  Occupational History  . Not on file  Social Needs  . Financial resource strain: Not on file  . Food insecurity:    Worry: Not on file    Inability: Not on file  . Transportation needs:    Medical: Not on file    Non-medical: Not on file  Tobacco Use  . Smoking status: Never Smoker  . Smokeless tobacco: Never Used  Substance and Sexual Activity  .  Alcohol use: No    Frequency: Never  . Drug use: No  . Sexual activity: Not on file  Lifestyle  . Physical activity:    Days per week: Not on file    Minutes per session: Not on file  . Stress: Not on file  Relationships  . Social connections:    Talks on phone: Not on file    Gets together: Not on file    Attends religious service: Not on file    Active member of club or organization: Not on file    Attends meetings of clubs or organizations: Not on file    Relationship status: Not on file  Other Topics Concern  . Not on file  Social History Narrative  . Not on file     Family History: The patient's family history includes Cancer in her mother; Hypertension in her brother, mother, and sister. There is no history of Pulmonary disease. ROS:   Please see the history of present illness.    All 14 point review of systems negative except as described per history of present illness  EKGs/Labs/Other Studies Reviewed:      Recent Labs: 10/07/2017: ALT 23; B Natriuretic Peptide 19.0; BUN 15; Creatinine, Ser 0.93; Hemoglobin 14.7; Platelets 218; Potassium 3.6; Sodium  138; TSH 3.178  Recent Lipid Panel No results found for: CHOL, TRIG, HDL, CHOLHDL, VLDL, LDLCALC, LDLDIRECT  Physical Exam:    VS:  BP 122/62   Pulse 74   Ht 5\' 6"  (1.676 m)   Wt 247 lb 3.2 oz (112.1 kg)   SpO2 97%   BMI 39.90 kg/m     Wt Readings from Last 3 Encounters:  11/12/17 247 lb 3.2 oz (112.1 kg)  10/07/17 216 lb 4.3 oz (98.1 kg)  10/05/17 247 lb 12.8 oz (112.4 kg)     GEN:  Well nourished, well developed in no acute distress HEENT: Normal NECK: No JVD; No carotid bruits LYMPHATICS: No lymphadenopathy CARDIAC: RRR, no murmurs, no rubs, no gallops RESPIRATORY:  Clear to auscultation without rales, wheezing or rhonchi  ABDOMEN: Soft, non-tender, non-distended MUSCULOSKELETAL:  No edema; No deformity  SKIN: Warm and dry LOWER EXTREMITIES: no swelling NEUROLOGIC:  Alert and oriented x  3 PSYCHIATRIC:  Normal affect   ASSESSMENT:    1. Other chronic pulmonary embolism without acute cor pulmonale (HCC)   2. OSA (obstructive sleep apnea)   3. Palpitations   4. Atypical chest pain    PLAN:    In order of problems listed above:  1. History of pulmonary emboli anticoagulated which I will continue repeated CT did not show any reactivation of the problem.  Last echocardiogram showed no evidence of tricuspid regurgitation which make me think that there is lack of significant pulmonary hypertension. 2. Obstructive sleep apnea followed by internal medicine team. 3. Palpitations denies having any after her bronchodilators has been replaced with steroids. 4. Atypical chest pain CT of her chest showed no evidence of critical coronary artery disease.   Medication Adjustments/Labs and Tests Ordered: Current medicines are reviewed at length with the patient today.  Concerns regarding medicines are outlined above.  No orders of the defined types were placed in this encounter.  Medication changes: No orders of the defined types were placed in this encounter.   Signed, Georgeanna Leaobert J. , MD, [facility] 11/12/2017 5:01 PM    [facility]

## 2017-11-12 NOTE — Patient Instructions (Signed)
Medication Instructions:   Your physician recommends that you continue on your current medications as directed. Please refer to the Current Medication list given to you today.  Labwork:  None  Testing/Procedures:  None  Follow-Up:  Your physician recommends that you schedule a follow-up appointment in: 2 months.  Any Other Special Instructions Will Be Listed Below (If Applicable).  If you need a refill on your cardiac medications before your next appointment, please call your pharmacy. 

## 2017-11-16 ENCOUNTER — Other Ambulatory Visit: Payer: PPO

## 2017-11-17 ENCOUNTER — Other Ambulatory Visit: Payer: PPO

## 2017-11-20 ENCOUNTER — Ambulatory Visit: Payer: PPO | Admitting: Pulmonary Disease

## 2017-11-20 ENCOUNTER — Encounter: Payer: Self-pay | Admitting: Pulmonary Disease

## 2017-11-20 VITALS — BP 142/90 | HR 106 | Ht 66.0 in | Wt 251.6 lb

## 2017-11-20 DIAGNOSIS — G4733 Obstructive sleep apnea (adult) (pediatric): Secondary | ICD-10-CM | POA: Diagnosis not present

## 2017-11-20 DIAGNOSIS — J42 Unspecified chronic bronchitis: Secondary | ICD-10-CM | POA: Diagnosis not present

## 2017-11-20 MED ORDER — FLUTICASONE PROPIONATE HFA 110 MCG/ACT IN AERO: 2.0000 | INHALATION_SPRAY | Freq: Two times a day (BID) | RESPIRATORY_TRACT | 12 refills | Status: AC | PRN

## 2017-11-20 NOTE — Progress Notes (Signed)
[facility]  Chief Complaint  Patient presents with  . Hospitalization Follow-up    Pt is congested, heart rate elevated, O2 was low 80's. Pt needs to know if she can restart all VS meds. Pt has increase of difficult breathing, SOB with exertion, dry cough, headaches, and some wheezing.     Constitutional: BP (!) 142/90 (BP Location: Left Arm, Cuff Size: Normal)   Pulse (!) 106   Ht 5\' 6"  (1.676 m)   Wt 251 lb 9.6 oz (114.1 kg)   SpO2 96%   BMI 40.61 kg/m   History of Present Illness: Gail Olson is a 67 y.o. female with chronic bronchitis, asthma, obstructive sleep apnea, and PE with DVT.  She was in hospital for shortness of breath and tachycardia.  There was concern she was getting too much albuterol and this was causing fast heart rate.  She hasn't been on singulair or flovent recently.  Her neighbor is cutting hay, and she has noticed more sinus congestion, hoarseness, cough, and chest congestion.    She is using CPAP nightly w/o issue.  Remains on eliquis.  Not having palpitations, fever, hemoptysis, chest pain, or abdominal pain.   Comprehensive Respiratory Exam:  Appearance - well kempt  ENMT - nasal mucosa moist, turbinates clear, midline nasal septum, no dental lesions, no gingival bleeding, no oral exudates, no tonsillar hypertrophy Neck - no masses, trachea midline, no thyromegaly, no elevation in JVP Respiratory - normal appearance of chest wall, normal respiratory effort w/o accessory muscle use, no dullness on percussion, no wheezing or rales CV - s1s2 regular rate and rhythm, no murmurs, no peripheral edema, radial pulses symmetric GI - soft, non tender, no masses Lymph - no adenopathy noted in neck and axillary areas MSK - normal muscle strength and tone, normal gait Ext - no cyanosis, clubbing, or joint inflammation noted Skin - no rashes, lesions, or ulcers Neuro - oriented to person, place, and time Psych -  normal mood and affect   Assessment/Plan:  Chronic bronchitis with allergic asthma. - will have her resume singulair for now - if symptoms persist then she can resume flovent - should be okay to use prn proair - don't think she needs xopenex  Obstructive sleep apnea. - she is compliant with CPAP and reports benefit - continue auto CPAP  Unprovoked pulmonary embolism with Lt leg DVT. - continue eliquis  Time spent 27 minutes  Patient Instructions  Resume using montelukast (singulair) 10 mg pill at night If chest congestion and cough persists, then restart flovent two puffs in the morning and two puffs at night  Follow up in 6 months   Coralyn HellingVineet Corsica Franson, MD [facility] 11/20/2017, 3:31 PM Pager:  [phone]  Flow Sheet  Pulmonary tests: CT angio chest 02/26/17 >> b/l PE with RV:LV ratio 1.2, ATX, small airway disease CT angio chest 03/19/17 >> decreased size of PE, ATX, small hiatal hernia  Sleep tests: ONO with RA 04/27/17 >> test time 8 hrs 21 min.  Basal SpO2 92%, low SpO2 83%.  Spent 18 min with SpO2 < 88%. HST 06/11/17 >> AHI 19.5, SaO2 low 81%. Auto CPAP 08/25/17 to 09/23/17 >> used on 30 of 30 nights with average 6 hrs 33 min.  Average AHI 1.6 with median CPAP 7 and 95 th percentile CPAP 10 cm H2O.  Cardiac tests: Echo Echo 10/08/17 >> EF 60 to 65%, grade 1 DD  Past Medical History: She  has a past medical  history of Arthritis, DVT (deep venous thrombosis) (HCC), Pulmonary embolism (HCC), and Thyroid disease.  Past Surgical History: She  has a past surgical history that includes Appendectomy; Back surgery; Cholecystectomy; and Ovary surgery.  Family History: Her family history includes Cancer in her mother; Hypertension in her brother, mother, and sister.  Social History: She  reports that she has never smoked. She has never used smokeless tobacco. She reports that she does not drink alcohol or use drugs.  Medications: Allergies as of 11/20/2017       Reactions   Nsaids Other (See Comments)   Not suppose to take with her blood thinner   Other Other (See Comments)   Extreme stomach pain if any of these are eaten: spicy foods, onions, fried foods, cauliflower, and broccoli   Prednisone Other (See Comments)   Not suppose to take due to advanced osteoporosis   Risedronate Other (See Comments)   (Actonel) Caused EXTREME JAW PAIN and muscles and joints ached   Adhesive [tape] Rash, Other (See Comments)   EKG pads left welts on the skin   Codeine Palpitations, Other (See Comments)   Tachycardia   Penicillins Rash   Has patient had a PCN reaction causing immediate rash, facial/tongue/throat swelling, SOB or lightheadedness with hypotension: Yes Has patient had a PCN reaction causing severe rash involving mucus membranes or skin necrosis: Unk Has patient had a PCN reaction that required hospitalization: Unk Has patient had a PCN reaction occurring within the last 10 years: Unk If all of the above answers are "NO", then may proceed with Cephalosporin use.      Medication List        Accurate as of 11/20/17  3:31 PM. Always use your most recent med list.          acetaminophen 650 MG CR tablet Commonly known as:  TYLENOL Take 650 mg by mouth every 8 (eight) hours as needed for pain.   AEROCHAMBER MV inhaler Use as instructed   apixaban 5 MG Tabs tablet Commonly known as:  ELIQUIS Take 1 tablet (5 mg total) by mouth 2 (two) times daily.   benzonatate 200 MG capsule Commonly known as:  TESSALON Take 1 capsule (200 mg total) by mouth 3 (three) times daily as needed for cough.   betamethasone dipropionate 0.05 % ointment Commonly known as:  DIPROLENE Apply 1 application topically 2 (two) times daily as needed (to affected areas).   Black Cohosh 20 MG Tabs Take 20 mg by mouth 3 (three) times a week.   CORICIDIN HBP PO Take 1 tablet by mouth as needed (cold symptoms).   fluticasone 110 MCG/ACT inhaler Commonly known as:   FLOVENT HFA Inhale 2 puffs into the lungs 2 (two) times daily as needed (Cough, wheeze, chest congestion, or shortness of breath).   fluticasone 50 MCG/ACT nasal spray Commonly known as:  FLONASE PLACE 2 SPRAYS INTO BOTH NOSTRILS AT BEDTIME AS NEEDED FOR ALLERGIES OR RHINITIS.   hydrochlorothiazide 12.5 MG capsule Commonly known as:  MICROZIDE Take 12.5 mg by mouth daily as needed for edema or fluid.   HYDROcodone-acetaminophen 5-325 MG tablet Commonly known as:  NORCO/VICODIN Take 1 tablet by mouth every 8 (eight) hours as needed for pain.   levothyroxine 75 MCG tablet Commonly known as:  SYNTHROID, LEVOTHROID Take 75 mcg by mouth every Tuesday, Thursday, Saturday, and Sunday.   levothyroxine 50 MCG tablet Commonly known as:  SYNTHROID, LEVOTHROID Take 50 mcg by mouth every Monday, Wednesday, and Friday.   loratadine  10 MG tablet Commonly known as:  CLARITIN Take 1 tablet (10 mg total) by mouth daily.   methocarbamol 750 MG tablet Commonly known as:  ROBAXIN Take 750 mg by mouth 3 (three) times daily as needed for muscle spasms.   metoprolol tartrate 50 MG tablet Commonly known as:  LOPRESSOR Take 1 tablet (50 mg total) by mouth 2 (two) times daily.   montelukast 10 MG tablet Commonly known as:  SINGULAIR TAKE 1 TABLET BY MOUTH EVERYDAY AT BEDTIME   nystatin 100000 UNIT/ML suspension Commonly known as:  MYCOSTATIN Take 5 mLs (500,000 Units total) by mouth 4 (four) times daily.   omeprazole 20 MG capsule Commonly known as:  PRILOSEC Take 20 mg by mouth daily as needed (for reflux).   OVER THE COUNTER MEDICATION Remefemin tablets: Take 1 tablet by mouth two to three times a week   PROAIR HFA 108 (90 Base) MCG/ACT inhaler Generic drug:  albuterol Inhale 2 puffs into the lungs See admin instructions. 2 puffs every four to six hours as needed for shortness of breath   PROBIOTIC ACIDOPHILUS PO Take 1 capsule by mouth daily.   UNABLE TO FIND Compression stockings,  daily   Vitamin D (Ergocalciferol) 50000 units Caps capsule Commonly known as:  DRISDOL Take 50,000 Units by mouth once a week.

## 2017-11-20 NOTE — Patient Instructions (Signed)
Resume using montelukast (singulair) 10 mg pill at night If chest congestion and cough persists, then restart flovent two puffs in the morning and two puffs at night  Follow up in 6 months

## 2017-11-22 DIAGNOSIS — J410 Simple chronic bronchitis: Secondary | ICD-10-CM | POA: Diagnosis not present

## 2017-11-22 DIAGNOSIS — J42 Unspecified chronic bronchitis: Secondary | ICD-10-CM | POA: Diagnosis not present

## 2017-11-22 DIAGNOSIS — I2782 Chronic pulmonary embolism: Secondary | ICD-10-CM | POA: Diagnosis not present

## 2017-11-22 DIAGNOSIS — G4733 Obstructive sleep apnea (adult) (pediatric): Secondary | ICD-10-CM | POA: Diagnosis not present

## 2017-12-03 ENCOUNTER — Telehealth: Payer: Self-pay | Admitting: Pulmonary Disease

## 2017-12-03 MED ORDER — AZITHROMYCIN 250 MG PO TABS: ORAL_TABLET | ORAL | 0 refills | Status: DC

## 2017-12-03 MED ORDER — PREDNISONE 10 MG PO TABS: ORAL_TABLET | ORAL | 0 refills | Status: DC

## 2017-12-03 NOTE — Telephone Encounter (Signed)
Can send script for Zpak.  Can send script for prednisone 10 mg pills >> 3 pills daily for 2 days, 2 pills daily for 2 days, 1 pill daily for 2 days.  #12 with no refills.

## 2017-12-03 NOTE — Telephone Encounter (Signed)
Called and spoke with pt letting her know VS said to send script of zpak as well as pred taper to her pharmacy for her.  Pt expressed understanding. Verified pt's preferred pharmacy and sent both scripts in for pt. Nothing further needed.

## 2017-12-03 NOTE — Telephone Encounter (Signed)
Called and spoke with pt who states she believes she has a bad sinus infection and is coughing a lot with white mucus that she is able to get up.  Pt states her head is congested and also states she has a headache from this too. Pt also has pain in her teeth.  Pt states symptoms began to get worse 9/23. Pt tried recs per VS that was stated at previous OV 9/20 but stated her symptoms have not improved since trying the meds he recommended.  Pt is wanting something to be called in to help with her symptoms. Dr. Craige Cotta, please advise on this for pt. Thanks!

## 2017-12-14 ENCOUNTER — Ambulatory Visit: Payer: PPO | Admitting: Cardiology

## 2017-12-22 DIAGNOSIS — G4733 Obstructive sleep apnea (adult) (pediatric): Secondary | ICD-10-CM | POA: Diagnosis not present

## 2017-12-22 DIAGNOSIS — J410 Simple chronic bronchitis: Secondary | ICD-10-CM | POA: Diagnosis not present

## 2017-12-22 DIAGNOSIS — I2782 Chronic pulmonary embolism: Secondary | ICD-10-CM | POA: Diagnosis not present

## 2017-12-22 DIAGNOSIS — J42 Unspecified chronic bronchitis: Secondary | ICD-10-CM | POA: Diagnosis not present

## 2018-01-12 ENCOUNTER — Encounter: Payer: Self-pay | Admitting: Cardiology

## 2018-01-12 ENCOUNTER — Ambulatory Visit (INDEPENDENT_AMBULATORY_CARE_PROVIDER_SITE_OTHER): Payer: PPO | Admitting: Cardiology

## 2018-01-12 VITALS — BP 110/72 | HR 81 | Ht 66.0 in | Wt 245.8 lb

## 2018-01-12 DIAGNOSIS — I2782 Chronic pulmonary embolism: Secondary | ICD-10-CM

## 2018-01-12 DIAGNOSIS — G4733 Obstructive sleep apnea (adult) (pediatric): Secondary | ICD-10-CM

## 2018-01-12 DIAGNOSIS — R002 Palpitations: Secondary | ICD-10-CM

## 2018-01-12 DIAGNOSIS — R072 Precordial pain: Secondary | ICD-10-CM | POA: Diagnosis not present

## 2018-01-12 NOTE — Progress Notes (Signed)
Cardiology Office Note:    Date:  01/12/2018   ID:  Gail Olson, DOB 1950-11-08, MRN [id number]  PCP:  Shellia Cleverly, PA  Cardiologist:  Gypsy Balsam, MD    Referring MD: Shellia Cleverly, [place]   Chief Complaint  Patient presents with  . 2 month follow up  Doing better  History of Present Illness:    Gail Olson is a 66 y.o. female with history of pulmonary emboli, pulmonary hypertension palpitations comes to my office for follow-up overall doing well palpitations ongoing shortness of breath improves she is gradually increasing the distance she is able to walk.  Overall she is doing well.  Past Medical History:  Diagnosis Date  . Arthritis   . DVT (deep venous thrombosis) (HCC)   . Pulmonary embolism (HCC)   . Thyroid disease     Past Surgical History:  Procedure Laterality Date  . APPENDECTOMY    . BACK SURGERY    . CHOLECYSTECTOMY    . OVARY SURGERY      Current Medications: Current Meds  Medication Sig  . acetaminophen (TYLENOL) 650 MG CR tablet Take 650 mg by mouth every 8 (eight) hours as needed for pain.  Marland Kitchen albuterol (PROAIR HFA) 108 (90 Base) MCG/ACT inhaler Inhale 2 puffs into the lungs See admin instructions. 2 puffs every four to six hours as needed for shortness of breath  . apixaban (ELIQUIS) 5 MG TABS tablet Take 1 tablet (5 mg total) by mouth 2 (two) times daily.  Marland Kitchen azithromycin (ZITHROMAX) 250 MG tablet Take two tabs today and then one daily until gone.  . benzonatate (TESSALON) 200 MG capsule Take 1 capsule (200 mg total) by mouth 3 (three) times daily as needed for cough.  . betamethasone dipropionate (DIPROLENE) 0.05 % ointment Apply 1 application topically 2 (two) times daily as needed (to affected areas).   . Black Cohosh 20 MG TABS Take 20 mg by mouth 3 (three) times a week.  Marland Kitchen DM-APAP-CPM (CORICIDIN HBP PO) Take 1 tablet by mouth as needed (cold symptoms).  . fluticasone (FLONASE) 50 MCG/ACT nasal spray PLACE 2 SPRAYS INTO BOTH NOSTRILS  AT BEDTIME AS NEEDED FOR ALLERGIES OR RHINITIS. (Patient taking differently: Place 2 sprays into both nostrils 2 (two) times daily. )  . fluticasone (FLOVENT HFA) 110 MCG/ACT inhaler Inhale 2 puffs into the lungs 2 (two) times daily as needed (Cough, wheeze, chest congestion, or shortness of breath).  . hydrochlorothiazide (MICROZIDE) 12.5 MG capsule Take 12.5 mg by mouth daily as needed for edema or fluid.  Marland Kitchen HYDROcodone-acetaminophen (NORCO/VICODIN) 5-325 MG tablet Take 1 tablet by mouth every 8 (eight) hours as needed for pain.  . Lactobacillus (PROBIOTIC ACIDOPHILUS PO) Take 1 capsule by mouth daily.  Marland Kitchen levothyroxine (SYNTHROID, LEVOTHROID) 50 MCG tablet Take 50 mcg by mouth every Monday, Wednesday, and Friday.  . levothyroxine (SYNTHROID, LEVOTHROID) 75 MCG tablet Take 75 mcg by mouth every Tuesday, Thursday, Saturday, and Sunday.  . loratadine (CLARITIN) 10 MG tablet Take 1 tablet (10 mg total) by mouth daily.  . methocarbamol (ROBAXIN) 750 MG tablet Take 750 mg by mouth 3 (three) times daily as needed for muscle spasms.   . metoprolol tartrate (LOPRESSOR) 50 MG tablet Take 1 tablet (50 mg total) by mouth 2 (two) times daily.  . montelukast (SINGULAIR) 10 MG tablet TAKE 1 TABLET BY MOUTH EVERYDAY AT BEDTIME (Patient taking differently: Take 10 mg by mouth at bedtime. )  . nystatin (MYCOSTATIN) 100000 UNIT/ML suspension Take 5  mLs (500,000 Units total) by mouth 4 (four) times daily.  Marland Kitchen omeprazole (PRILOSEC) 20 MG capsule Take 20 mg by mouth daily as needed (for reflux).   Marland Kitchen OVER THE COUNTER MEDICATION Remefemin tablets: Take 1 tablet by mouth two to three times a week  . predniSONE (DELTASONE) 10 MG tablet Take 3tabs x2days, 2tabs x2days, 1tab x2days, then stop  . Spacer/Aero-Holding Chambers (AEROCHAMBER MV) inhaler Use as instructed  . UNABLE TO FIND Compression stockings, daily  . Vitamin D, Ergocalciferol, (DRISDOL) 50000 units CAPS capsule Take 50,000 Units by mouth once a week.      Allergies:   Nsaids; Other; Prednisone; Risedronate; Adhesive [tape]; Codeine; and Penicillins   Social History   Socioeconomic History  . Marital status: Married    Spouse name: Not on file  . Number of children: Not on file  . Years of education: Not on file  . Highest education level: Not on file  Occupational History  . Not on file  Social Needs  . Financial resource strain: Not on file  . Food insecurity:    Worry: Not on file    Inability: Not on file  . Transportation needs:    Medical: Not on file    Non-medical: Not on file  Tobacco Use  . Smoking status: Never Smoker  . Smokeless tobacco: Never Used  Substance and Sexual Activity  . Alcohol use: No    Frequency: Never  . Drug use: No  . Sexual activity: Not on file  Lifestyle  . Physical activity:    Days per week: Not on file    Minutes per session: Not on file  . Stress: Not on file  Relationships  . Social connections:    Talks on phone: Not on file    Gets together: Not on file    Attends religious service: Not on file    Active member of club or organization: Not on file    Attends meetings of clubs or organizations: Not on file    Relationship status: Not on file  Other Topics Concern  . Not on file  Social History Narrative  . Not on file     Family History: The patient's family history includes Cancer in her mother; Hypertension in her brother, mother, and sister. There is no history of Pulmonary disease. ROS:   Please see the history of present illness.    All 14 point review of systems negative except as described per history of present illness  EKGs/Labs/Other Studies Reviewed:      Recent Labs: 10/07/2017: ALT 23; B Natriuretic Peptide 19.0; BUN 15; Creatinine, Ser 0.93; Hemoglobin 14.7; Platelets 218; Potassium 3.6; Sodium 138; TSH 3.178  Recent Lipid Panel No results found for: CHOL, TRIG, HDL, CHOLHDL, VLDL, LDLCALC, LDLDIRECT  Physical Exam:    VS:  BP 110/72   Pulse 81    Ht 5\' 6"  (1.676 m)   Wt 245 lb 12.8 oz (111.5 kg)   SpO2 97%   BMI 39.67 kg/m     Wt Readings from Last 3 Encounters:  01/12/18 245 lb 12.8 oz (111.5 kg)  11/20/17 251 lb 9.6 oz (114.1 kg)  11/12/17 247 lb 3.2 oz (112.1 kg)     GEN:  Well nourished, well developed in no acute distress HEENT: Normal NECK: No JVD; No carotid bruits LYMPHATICS: No lymphadenopathy CARDIAC: RRR, no murmurs, no rubs, no gallops RESPIRATORY:  Clear to auscultation without rales, wheezing or rhonchi  ABDOMEN: Soft, non-tender, non-distended MUSCULOSKELETAL:  No  edema; No deformity  SKIN: Warm and dry LOWER EXTREMITIES: no swelling NEUROLOGIC:  Alert and oriented x 3 PSYCHIATRIC:  Normal affect   ASSESSMENT:    1. Other chronic pulmonary embolism without acute cor pulmonale (HCC)   2. OSA (obstructive sleep apnea)   3. Precordial chest pain   4. Palpitations    PLAN:    In order of problems listed above:  1. History of pulmonary emboli anticoagulated which I will continue.  Overall gradually improving. 2. Obstructive sleep apnea followed by internal medicine team 3. Precordial chest pain denies having any   Medication Adjustments/Labs and Tests Ordered: Current medicines are reviewed at length with the patient today.  Concerns regarding medicines are outlined above.  No orders of the defined types were placed in this encounter.  Medication changes: No orders of the defined types were placed in this encounter.   Signed, Georgeanna Leaobert J. Krasowski, MD, [facility] 01/12/2018 4:16 PM    [facility]

## 2018-01-12 NOTE — Patient Instructions (Signed)
Medication Instructions:  Your physician recommends that you continue on your current medications as directed. Please refer to the Current Medication list given to you today.  If you need a refill on your cardiac medications before your next appointment, please call your pharmacy.   Lab work: None.  If you have labs (blood work) drawn today and your tests are completely normal, you will receive your results only by: . MyChart Message (if you have MyChart) OR . A paper copy in the mail If you have any lab test that is abnormal or we need to change your treatment, we will call you to review the results.  Testing/Procedures: None.   Follow-Up: At [facility], you and your health needs are our priority.  As part of our continuing mission to provide you with exceptional heart care, we have created designated Provider Care Teams.  These Care Teams include your primary Cardiologist (physician) and Advanced Practice Providers (APPs -  Physician Assistants and Nurse Practitioners) who all work together to provide you with the care you need, when you need it. You will need a follow up appointment in 5 months.  Please call our office 2 months in advance to schedule this appointment.  You may see No primary care provider on file. or another member of our [facility] Provider Team in [place]: Brian Munley, MD . Rajan Revankar, MD  Any Other Special Instructions Will Be Listed Below (If Applicable).    

## 2018-01-16 IMAGING — CT CT ANGIO CHEST
2 of 8 series · 18 of 46 positions shown · IV contrast (APPLIED)
Comparison: Chest radiograph from earlier today. 02/26/2017 chest
CT angiogram.

CLINICAL DATA: Left upper extremity pain for 2 days. History of DVT
and PE.

EXAM:
CT ANGIOGRAPHY CHEST WITH CONTRAST
TECHNIQUE: Multidetector CT imaging of the chest was performed using the
standard protocol during bolus administration of intravenous
contrast. Multiplanar CT image reconstructions and MIPs were
obtained to evaluate the vascular anatomy.
CONTRAST:  80 cc 8ECFX3-U5W IOPAMIDOL ([id number]) INJECTION 76%

[Series 6: thins · axial · 0.65mm/px · z∈[+1155,+1430]mm · 15 of 303 slices shown]
[im 14/303  lung]
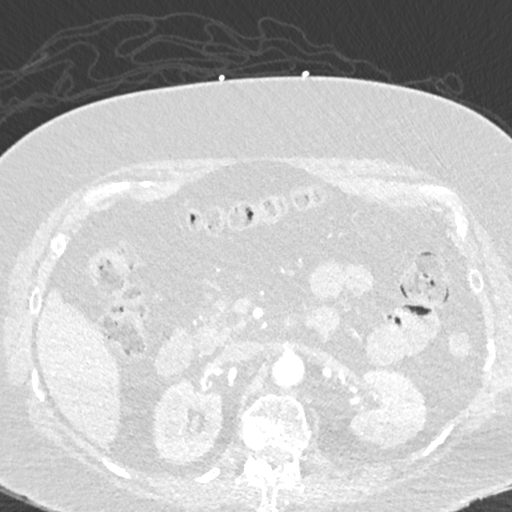
[im 42/303  soft-tissue]
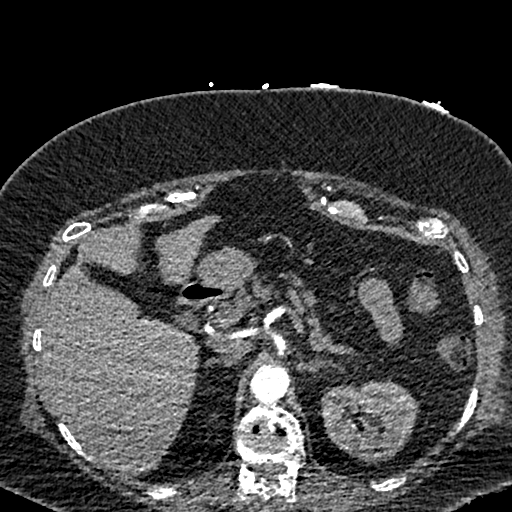
[im 55/303  lung]
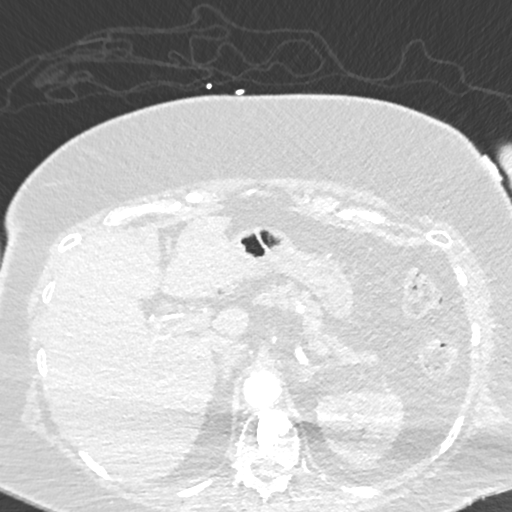
[im 69/303  soft-tissue]
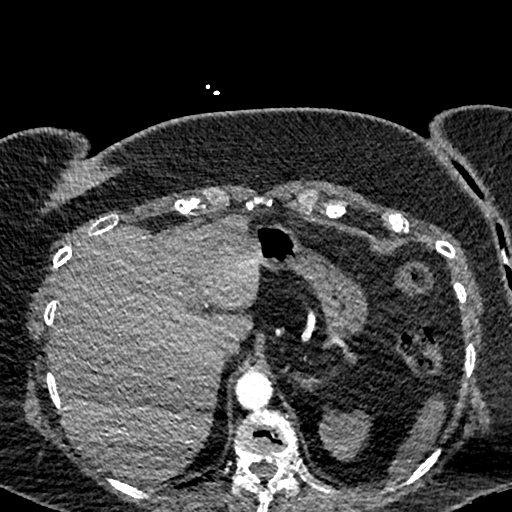
[im 97/303  lung]
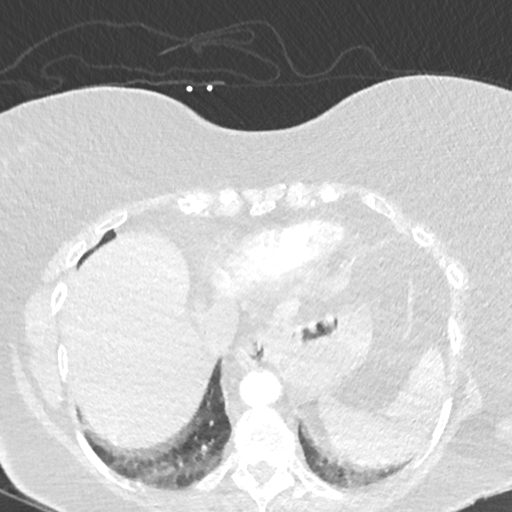
[im 110/303  soft-tissue]
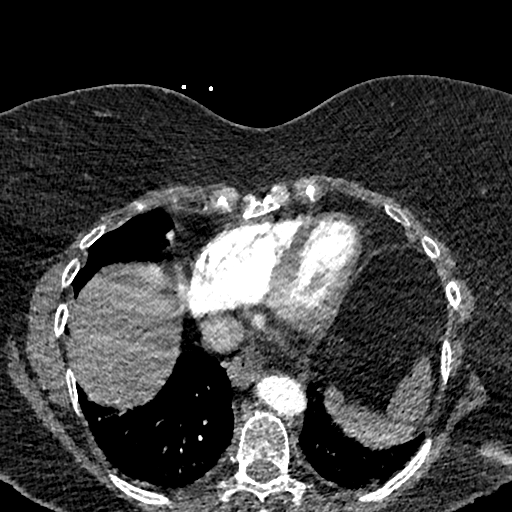
[im 138/303  lung]
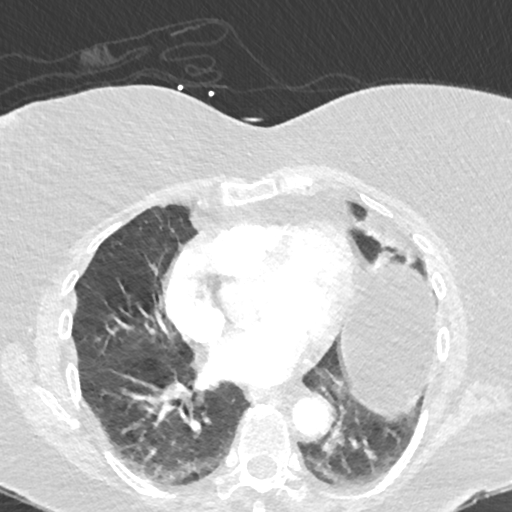
[im 152/303  soft-tissue]
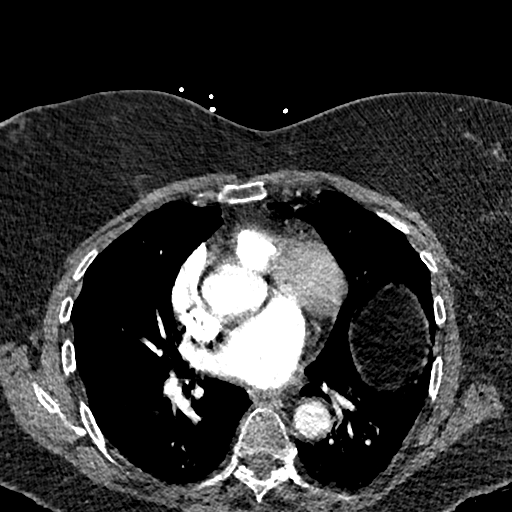
[im 165/303  lung]
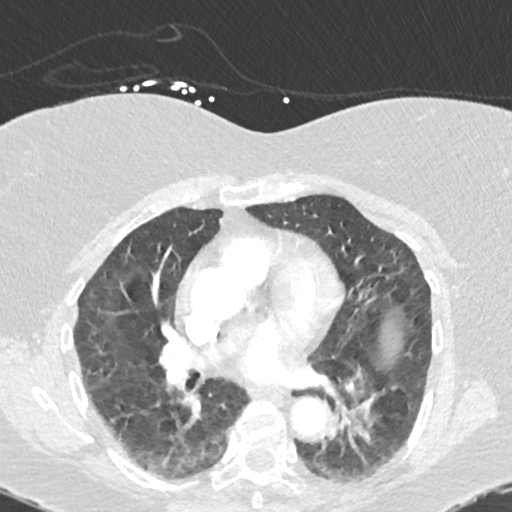
[im 193/303  soft-tissue]
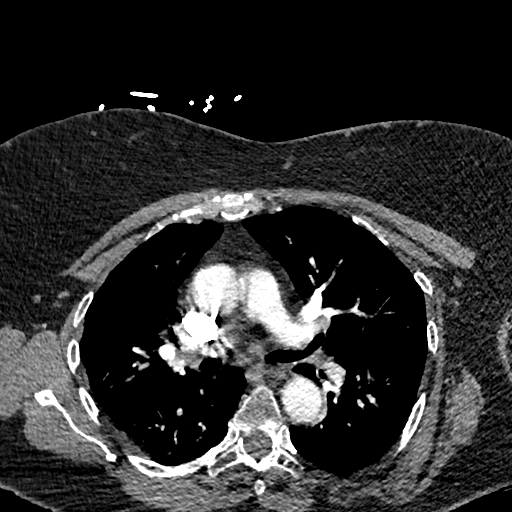
[im 206/303  lung]
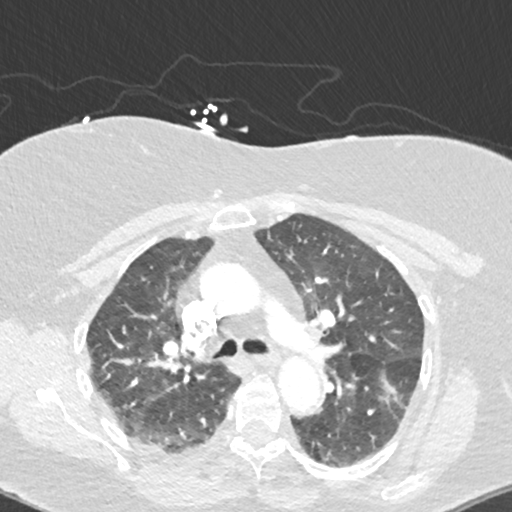
[im 234/303  soft-tissue]
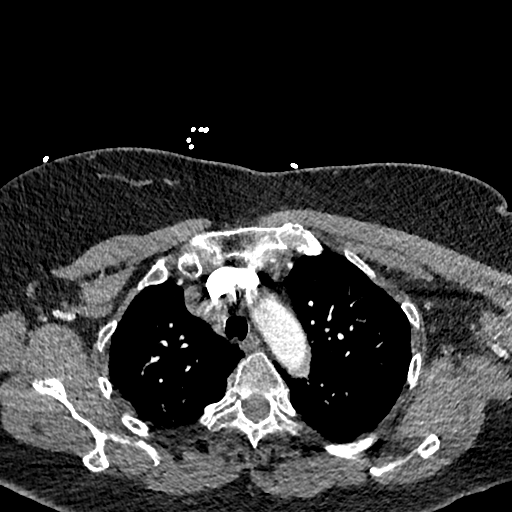
[im 248/303  lung]
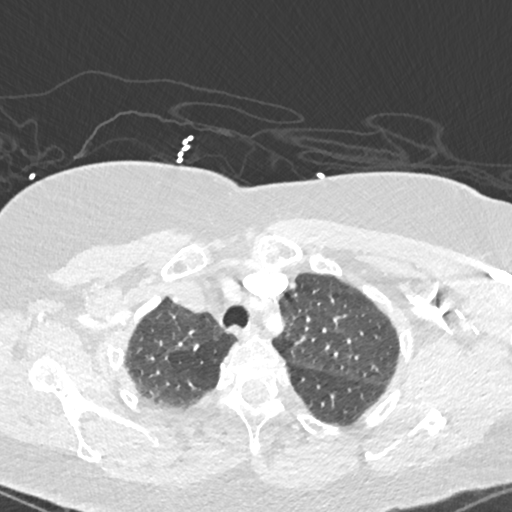
[im 261/303  soft-tissue]
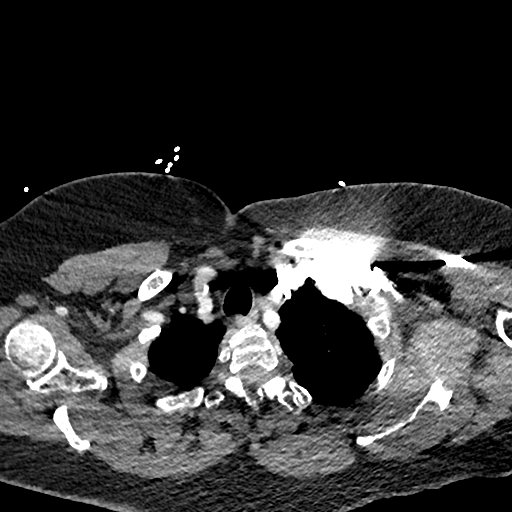
[im 289/303  lung]
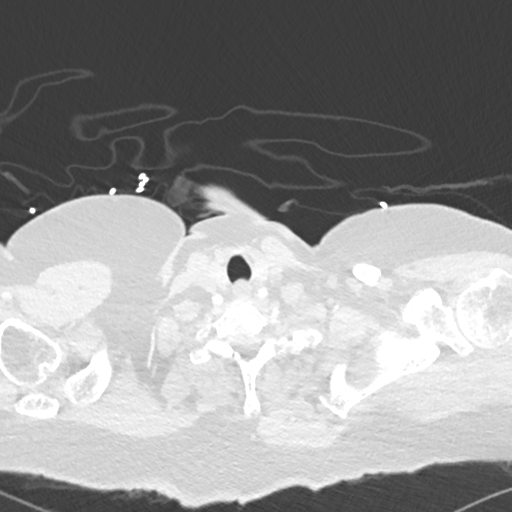

[Series 8: coronal mpr · coronal · 0.59mm/px · 3 of 139 slices shown]
[im 35/139  soft-tissue]
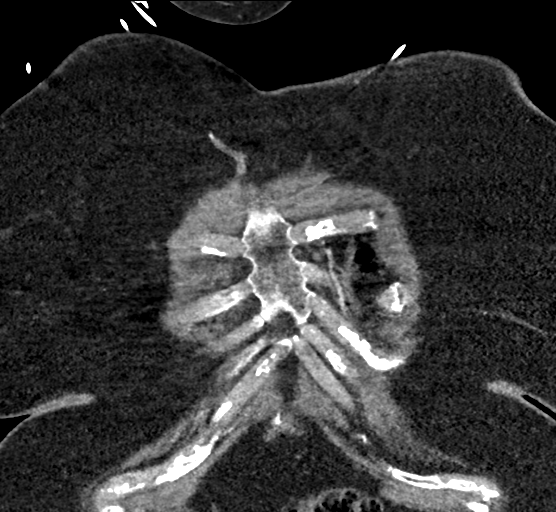
[im 70/139  soft-tissue]
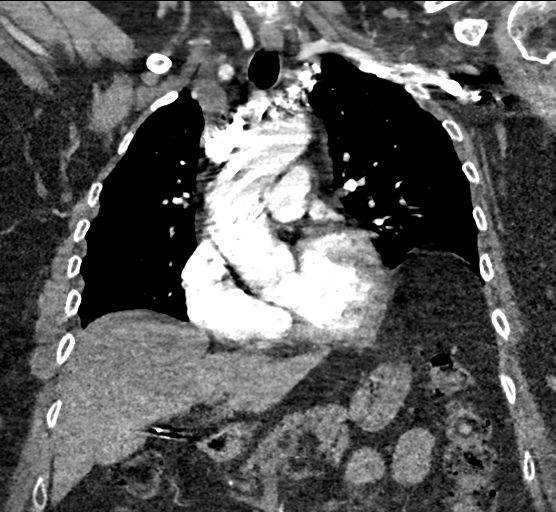
[im 104/139  soft-tissue]
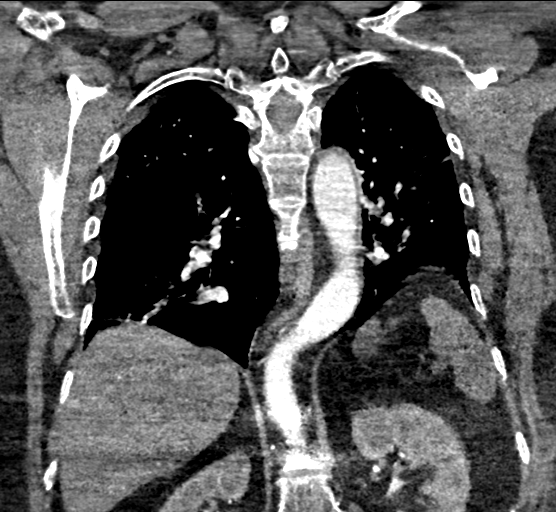

[18 of 46 positions shown; findings below may reference images not displayed]

FINDINGS: Cardiovascular: The study is low quality for the evaluation of
pulmonary embolism, significantly limited by motion degradation and
suboptimal contrast opacification of the pulmonary arteries, which
largely precludes evaluation of the segmental and subsegmental
pulmonary arteries. No evidence of acute central pulmonary emboli.
Recently identified bilateral lobar, segmental and subsegmental
pulmonary emboli appears substantially decreased, with small
residual subacute filling defects in the segmental and subsegmental
pulmonary artery branches of the left upper lobe, left lower lobe
and right middle lobe. Atherosclerotic nonaneurysmal thoracic aorta.
Normal caliber pulmonary arteries (main pulmonary artery diameter
2.6 cm). Normal heart size. No significant pericardial
fluid/thickening.

Mediastinum/Nodes: No discrete thyroid nodules. Unremarkable
esophagus. No pathologically enlarged axillary, mediastinal or hilar
lymph nodes.

Lungs/Pleura: No pneumothorax. No pleural effusion. No acute
consolidative airspace disease, lung masses or significant pulmonary
nodules. Subsegmental scarring versus atelectasis in lower lobes.

Upper abdomen: Small hiatal hernia.  Cholecystectomy.

Musculoskeletal: No aggressive appearing focal osseous lesions.
Stable vertebroplasty changes in the T11 and T12 mild vertebral
compression fractures. Mild thoracic spondylosis.

Review of the MIP images confirms the above findings.
IMPRESSION: 1. Very limited motion degraded scan. No acute central pulmonary
embolism. Recently identified bilateral pulmonary emboli are
substantially decreased with small residual subacute emboli in the
segmental and subsegmental bilateral pulmonary artery branches as
detailed.
2. Subsegmental scarring versus atelectasis in the lower lobes.
3. Small hiatal hernia.

Aortic Atherosclerosis ([id number]-[id number]).

## 2018-01-18 DIAGNOSIS — E039 Hypothyroidism, unspecified: Secondary | ICD-10-CM | POA: Diagnosis not present

## 2018-01-18 DIAGNOSIS — Z9989 Dependence on other enabling machines and devices: Secondary | ICD-10-CM | POA: Diagnosis not present

## 2018-01-18 DIAGNOSIS — Z23 Encounter for immunization: Secondary | ICD-10-CM | POA: Diagnosis not present

## 2018-01-18 DIAGNOSIS — R7301 Impaired fasting glucose: Secondary | ICD-10-CM | POA: Diagnosis not present

## 2018-01-18 DIAGNOSIS — I10 Essential (primary) hypertension: Secondary | ICD-10-CM | POA: Diagnosis not present

## 2018-01-18 DIAGNOSIS — G4733 Obstructive sleep apnea (adult) (pediatric): Secondary | ICD-10-CM | POA: Diagnosis not present

## 2018-01-22 DIAGNOSIS — J410 Simple chronic bronchitis: Secondary | ICD-10-CM | POA: Diagnosis not present

## 2018-01-22 DIAGNOSIS — J42 Unspecified chronic bronchitis: Secondary | ICD-10-CM | POA: Diagnosis not present

## 2018-01-22 DIAGNOSIS — G4733 Obstructive sleep apnea (adult) (pediatric): Secondary | ICD-10-CM | POA: Diagnosis not present

## 2018-01-22 DIAGNOSIS — I2782 Chronic pulmonary embolism: Secondary | ICD-10-CM | POA: Diagnosis not present

## 2018-02-04 DIAGNOSIS — G4733 Obstructive sleep apnea (adult) (pediatric): Secondary | ICD-10-CM | POA: Diagnosis not present

## 2018-02-04 DIAGNOSIS — H2513 Age-related nuclear cataract, bilateral: Secondary | ICD-10-CM | POA: Diagnosis not present

## 2018-02-04 DIAGNOSIS — H52223 Regular astigmatism, bilateral: Secondary | ICD-10-CM | POA: Diagnosis not present

## 2018-02-10 DIAGNOSIS — Z872 Personal history of diseases of the skin and subcutaneous tissue: Secondary | ICD-10-CM | POA: Diagnosis not present

## 2018-02-10 DIAGNOSIS — Z1151 Encounter for screening for human papillomavirus (HPV): Secondary | ICD-10-CM | POA: Diagnosis not present

## 2018-02-10 DIAGNOSIS — Z124 Encounter for screening for malignant neoplasm of cervix: Secondary | ICD-10-CM | POA: Diagnosis not present

## 2018-02-10 DIAGNOSIS — N952 Postmenopausal atrophic vaginitis: Secondary | ICD-10-CM | POA: Diagnosis not present

## 2018-02-10 DIAGNOSIS — Z90721 Acquired absence of ovaries, unilateral: Secondary | ICD-10-CM | POA: Diagnosis not present

## 2018-02-10 DIAGNOSIS — Z01419 Encounter for gynecological examination (general) (routine) without abnormal findings: Secondary | ICD-10-CM | POA: Diagnosis not present

## 2018-02-10 DIAGNOSIS — Z801 Family history of malignant neoplasm of trachea, bronchus and lung: Secondary | ICD-10-CM | POA: Diagnosis not present

## 2018-02-21 DIAGNOSIS — J410 Simple chronic bronchitis: Secondary | ICD-10-CM | POA: Diagnosis not present

## 2018-02-21 DIAGNOSIS — J42 Unspecified chronic bronchitis: Secondary | ICD-10-CM | POA: Diagnosis not present

## 2018-02-21 DIAGNOSIS — I2782 Chronic pulmonary embolism: Secondary | ICD-10-CM | POA: Diagnosis not present

## 2018-02-21 DIAGNOSIS — G4733 Obstructive sleep apnea (adult) (pediatric): Secondary | ICD-10-CM | POA: Diagnosis not present

## 2018-03-03 DIAGNOSIS — G4733 Obstructive sleep apnea (adult) (pediatric): Secondary | ICD-10-CM | POA: Diagnosis not present

## 2018-03-03 DIAGNOSIS — J410 Simple chronic bronchitis: Secondary | ICD-10-CM | POA: Diagnosis not present

## 2018-03-03 DIAGNOSIS — J42 Unspecified chronic bronchitis: Secondary | ICD-10-CM | POA: Diagnosis not present

## 2018-03-03 DIAGNOSIS — I2782 Chronic pulmonary embolism: Secondary | ICD-10-CM | POA: Diagnosis not present

## 2018-03-24 DIAGNOSIS — G4733 Obstructive sleep apnea (adult) (pediatric): Secondary | ICD-10-CM | POA: Diagnosis not present

## 2018-03-24 DIAGNOSIS — J410 Simple chronic bronchitis: Secondary | ICD-10-CM | POA: Diagnosis not present

## 2018-03-24 DIAGNOSIS — I2782 Chronic pulmonary embolism: Secondary | ICD-10-CM | POA: Diagnosis not present

## 2018-03-24 DIAGNOSIS — J42 Unspecified chronic bronchitis: Secondary | ICD-10-CM | POA: Diagnosis not present

## 2018-03-31 ENCOUNTER — Other Ambulatory Visit (HOSPITAL_BASED_OUTPATIENT_CLINIC_OR_DEPARTMENT_OTHER): Payer: Self-pay | Admitting: General Practice

## 2018-03-31 ENCOUNTER — Ambulatory Visit (HOSPITAL_BASED_OUTPATIENT_CLINIC_OR_DEPARTMENT_OTHER)
Admission: RE | Admit: 2018-03-31 | Discharge: 2018-03-31 | Disposition: A | Discharge status: Home / Self Care | Payer: PPO | Source: Ambulatory Visit | Attending: General Practice | Admitting: General Practice

## 2018-03-31 DIAGNOSIS — Z8781 Personal history of (healed) traumatic fracture: Secondary | ICD-10-CM

## 2018-03-31 DIAGNOSIS — M7989 Other specified soft tissue disorders: Secondary | ICD-10-CM | POA: Diagnosis not present

## 2018-03-31 DIAGNOSIS — R52 Pain, unspecified: Secondary | ICD-10-CM | POA: Insufficient documentation

## 2018-03-31 DIAGNOSIS — M79661 Pain in right lower leg: Secondary | ICD-10-CM | POA: Diagnosis not present

## 2018-03-31 DIAGNOSIS — M79604 Pain in right leg: Secondary | ICD-10-CM | POA: Diagnosis not present

## 2018-03-31 DIAGNOSIS — Z9989 Dependence on other enabling machines and devices: Secondary | ICD-10-CM | POA: Diagnosis not present

## 2018-03-31 DIAGNOSIS — M1990 Unspecified osteoarthritis, unspecified site: Secondary | ICD-10-CM | POA: Diagnosis not present

## 2018-03-31 DIAGNOSIS — G4733 Obstructive sleep apnea (adult) (pediatric): Secondary | ICD-10-CM | POA: Diagnosis not present

## 2018-03-31 DIAGNOSIS — Z86718 Personal history of other venous thrombosis and embolism: Secondary | ICD-10-CM | POA: Diagnosis not present

## 2018-03-31 DIAGNOSIS — E039 Hypothyroidism, unspecified: Secondary | ICD-10-CM | POA: Diagnosis not present

## 2018-03-31 DIAGNOSIS — E559 Vitamin D deficiency, unspecified: Secondary | ICD-10-CM | POA: Diagnosis not present

## 2018-03-31 DIAGNOSIS — M47816 Spondylosis without myelopathy or radiculopathy, lumbar region: Secondary | ICD-10-CM | POA: Diagnosis not present

## 2018-03-31 DIAGNOSIS — I10 Essential (primary) hypertension: Secondary | ICD-10-CM | POA: Diagnosis not present

## 2018-03-31 DIAGNOSIS — R7301 Impaired fasting glucose: Secondary | ICD-10-CM | POA: Diagnosis not present

## 2018-04-01 ENCOUNTER — Other Ambulatory Visit: Payer: Self-pay | Admitting: Cardiology

## 2018-04-20 DIAGNOSIS — J40 Bronchitis, not specified as acute or chronic: Secondary | ICD-10-CM | POA: Diagnosis not present

## 2018-04-20 DIAGNOSIS — G4733 Obstructive sleep apnea (adult) (pediatric): Secondary | ICD-10-CM | POA: Diagnosis not present

## 2018-04-20 DIAGNOSIS — R7301 Impaired fasting glucose: Secondary | ICD-10-CM | POA: Diagnosis not present

## 2018-04-20 DIAGNOSIS — E669 Obesity, unspecified: Secondary | ICD-10-CM

## 2018-04-20 DIAGNOSIS — Z9989 Dependence on other enabling machines and devices: Secondary | ICD-10-CM | POA: Diagnosis not present

## 2018-04-20 DIAGNOSIS — E039 Hypothyroidism, unspecified: Secondary | ICD-10-CM | POA: Diagnosis not present

## 2018-04-20 DIAGNOSIS — R05 Cough: Secondary | ICD-10-CM | POA: Diagnosis not present

## 2018-04-20 DIAGNOSIS — I2699 Other pulmonary embolism without acute cor pulmonale: Secondary | ICD-10-CM | POA: Diagnosis not present

## 2018-04-20 DIAGNOSIS — I10 Essential (primary) hypertension: Secondary | ICD-10-CM | POA: Diagnosis not present

## 2018-04-20 HISTORY — DX: Obesity, unspecified: E66.9

## 2018-04-24 DIAGNOSIS — J410 Simple chronic bronchitis: Secondary | ICD-10-CM | POA: Diagnosis not present

## 2018-04-24 DIAGNOSIS — J42 Unspecified chronic bronchitis: Secondary | ICD-10-CM | POA: Diagnosis not present

## 2018-04-24 DIAGNOSIS — I2782 Chronic pulmonary embolism: Secondary | ICD-10-CM | POA: Diagnosis not present

## 2018-04-24 DIAGNOSIS — G4733 Obstructive sleep apnea (adult) (pediatric): Secondary | ICD-10-CM | POA: Diagnosis not present

## 2018-05-06 ENCOUNTER — Other Ambulatory Visit: Payer: Self-pay | Admitting: Pulmonary Disease

## 2018-05-23 DIAGNOSIS — J42 Unspecified chronic bronchitis: Secondary | ICD-10-CM | POA: Diagnosis not present

## 2018-05-23 DIAGNOSIS — G4733 Obstructive sleep apnea (adult) (pediatric): Secondary | ICD-10-CM | POA: Diagnosis not present

## 2018-05-23 DIAGNOSIS — I2782 Chronic pulmonary embolism: Secondary | ICD-10-CM | POA: Diagnosis not present

## 2018-05-23 DIAGNOSIS — J410 Simple chronic bronchitis: Secondary | ICD-10-CM | POA: Diagnosis not present

## 2018-06-21 ENCOUNTER — Telehealth: Payer: Self-pay | Admitting: Cardiology

## 2018-06-21 NOTE — Telephone Encounter (Signed)
Virtual Visit Pre-Appointment Phone Call  Steps For Call:  1. Confirm consent - "In the setting of the current Covid19 crisis, you are scheduled for a (phone or video) visit with your provider on (date) at (time).  Just as we do with many in-office visits, in order for you to participate in this visit, we must obtain consent.  If you'd like, I can send this to your mychart (if signed up) or email for you to review.  Otherwise, I can obtain your verbal consent now.  All virtual visits are billed to your insurance company just like a normal visit would be.  By agreeing to a virtual visit, we'd like you to understand that the technology does not allow for your provider to perform an examination, and thus may limit your provider's ability to fully assess your condition. If your provider identifies any concerns that need to be evaluated in person, we will make arrangements to do so.  Finally, though the technology is pretty good, we cannot assure that it will always work on either your or our end, and in the setting of a video visit, we may have to convert it to a phone-only visit.  In either situation, we cannot ensure that we have a secure connection.  Are you willing to proceed?" STAFF: Did the patient verbally acknowledge consent to telehealth visit? Document YES/NO here: YES  2. Confirm the BEST phone number to call the day of the visit by including in appointment notes  3. Give patient instructions for WebEx/MyChart download to smartphone as below or Doximity/Doxy.me if video visit (depending on what platform provider is using)  4. Advise patient to be prepared with their blood pressure, heart rate, weight, any heart rhythm information, their current medicines, and a piece of paper and pen handy for any instructions they may receive the day of their visit  5. Inform patient they will receive a phone call 15 minutes prior to their appointment time (may be from unknown caller ID) so they should be  prepared to answer  6. Confirm that appointment type is correct in Epic appointment notes (VIDEO vs PHONE)     TELEPHONE CALL NOTE  Gail Olson has been deemed a candidate for a follow-up tele-health visit to limit community exposure during the Covid-19 pandemic. I spoke with the patient via phone to ensure availability of phone/video source, confirm preferred email & phone number, and discuss instructions and expectations.  I reminded BREKYN OGLE to be prepared with any vital sign and/or heart rhythm information that could potentially be obtained via home monitoring, at the time of her visit. I reminded ZAYLEY BOXELL to expect a phone call at the time of her visit if her visit.  Kittie Plater 06/21/2018 11:22 AM   INSTRUCTIONS FOR DOWNLOADING THE WEBEX APP TO SMARTPHONE  - If Apple, ask patient to go to App Store and type in WebEx in the search bar. Download [employer], the blue/green circle. If Android, go to [employer] and type in [employer] in the search bar. The app is free but as with any other app downloads, their phone may require them to verify saved payment information or Apple/Android password.  - The patient does NOT have to create an account. - On the day of the visit, the assist will walk the patient through joining the meeting with the meeting number/password.  INSTRUCTIONS FOR DOWNLOADING THE MYCHART APP TO SMARTPHONE  - The patient must first make  sure to have activated MyChart and know their login information - If Apple, go to [employer] and type in MyChart in the search bar and download the app. If Android, ask patient to go to [employer] and type in [place] in the search bar and download the app. The app is free but as with any other app downloads, their phone may require them to verify saved payment information or Apple/Android password.  - The patient will need to then log into the app with their MyChart username and password, and  select [facility] as their healthcare provider to link the account. When it is time for your visit, go to the MyChart app, find appointments, and click Begin Video Visit. Be sure to Select Allow for your device to access the Microphone and Camera for your visit. You will then be connected, and your provider will be with you shortly.  **If they have any issues connecting, or need assistance please contact MyChart service desk [phone])**  **If using a computer, in order to ensure the best quality for their visit they will need to use either of the following Internet Browsers: [employer], or Google Chrome**  IF USING DOXIMITY or DOXY.ME - The patient will receive a link just prior to their visit, either by text or email (to be determined day of appointment depending on if it's doxy.me or Doximity).     FULL LENGTH CONSENT FOR TELE-HEALTH VISIT   I hereby voluntarily request, consent and authorize [facility] and its employed or contracted physicians, physician assistants, nurse practitioners or other licensed health care professionals (the Practitioner), to provide me with telemedicine health care services (the Services") as deemed necessary by the treating Practitioner. I acknowledge and consent to receive the Services by the Practitioner via telemedicine. I understand that the telemedicine visit will involve communicating with the Practitioner through live audiovisual communication technology and the disclosure of certain medical information by electronic transmission. I acknowledge that I have been given the opportunity to request an in-person assessment or other available alternative prior to the telemedicine visit and am voluntarily participating in the telemedicine visit.  I understand that I have the right to withhold or withdraw my consent to the use of telemedicine in the course of my care at any time, without affecting my right to future care or treatment, and that  the Practitioner or I may terminate the telemedicine visit at any time. I understand that I have the right to inspect all information obtained and/or recorded in the course of the telemedicine visit and may receive copies of available information for a reasonable fee.  I understand that some of the potential risks of receiving the Services via telemedicine include:   Delay or interruption in medical evaluation due to technological equipment failure or disruption;  Information transmitted may not be sufficient (e.g. poor resolution of images) to allow for appropriate medical decision making by the Practitioner; and/or   In rare instances, security protocols could fail, causing a breach of personal health information.  Furthermore, I acknowledge that it is my responsibility to provide information about my medical history, conditions and care that is complete and accurate to the best of my ability. I acknowledge that Practitioner's advice, recommendations, and/or decision may be based on factors not within their control, such as incomplete or inaccurate data provided by me or distortions of diagnostic images or specimens that may result from electronic transmissions. I understand that the practice of medicine is not an exact  science and that Practitioner makes no warranties or guarantees regarding treatment outcomes. I acknowledge that I will receive a copy of this consent concurrently upon execution via email to the email address I last provided but may also request a printed copy by calling the office of [facility].    I understand that my insurance will be billed for this visit.   I have read or had this consent read to me.  I understand the contents of this consent, which adequately explains the benefits and risks of the Services being provided via telemedicine.   I have been provided ample opportunity to ask questions regarding this consent and the Services and have had my questions answered to  my satisfaction.  I give my informed consent for the services to be provided through the use of telemedicine in my medical care  By participating in this telemedicine visit I agree to the above.

## 2018-06-23 DIAGNOSIS — G4733 Obstructive sleep apnea (adult) (pediatric): Secondary | ICD-10-CM | POA: Diagnosis not present

## 2018-06-23 DIAGNOSIS — J410 Simple chronic bronchitis: Secondary | ICD-10-CM | POA: Diagnosis not present

## 2018-06-23 DIAGNOSIS — J42 Unspecified chronic bronchitis: Secondary | ICD-10-CM | POA: Diagnosis not present

## 2018-06-23 DIAGNOSIS — I2782 Chronic pulmonary embolism: Secondary | ICD-10-CM | POA: Diagnosis not present

## 2018-06-30 ENCOUNTER — Other Ambulatory Visit: Payer: Self-pay

## 2018-06-30 ENCOUNTER — Encounter: Payer: Self-pay | Admitting: Cardiology

## 2018-06-30 ENCOUNTER — Telehealth (INDEPENDENT_AMBULATORY_CARE_PROVIDER_SITE_OTHER): Payer: PPO | Admitting: Cardiology

## 2018-06-30 VITALS — Wt 238.0 lb

## 2018-06-30 DIAGNOSIS — R0609 Other forms of dyspnea: Secondary | ICD-10-CM

## 2018-06-30 DIAGNOSIS — R0789 Other chest pain: Secondary | ICD-10-CM

## 2018-06-30 DIAGNOSIS — R079 Chest pain, unspecified: Secondary | ICD-10-CM | POA: Diagnosis not present

## 2018-06-30 DIAGNOSIS — I2721 Secondary pulmonary arterial hypertension: Secondary | ICD-10-CM | POA: Diagnosis not present

## 2018-06-30 DIAGNOSIS — R002 Palpitations: Secondary | ICD-10-CM | POA: Diagnosis not present

## 2018-06-30 NOTE — Patient Instructions (Signed)
Medication Instructions:  Your physician recommends that you continue on your current medications as directed. Please refer to the Current Medication list given to you today.  If you need a refill on your cardiac medications before your next appointment, please call your pharmacy.   Lab work: Your physician recommends that you return for lab work today: Troponin   If you have labs (blood work) drawn today and your tests are completely normal, you will receive your results only by: Marland Kitchen MyChart Message (if you have MyChart) OR . A paper copy in the mail If you have any lab test that is abnormal or we need to change your treatment, we will call you to review the results.  Testing/Procedures: You had a ekg today.   Follow-Up: At [facility], you and your health needs are our priority.  As part of our continuing mission to provide you with exceptional heart care, we have created designated Provider Care Teams.  These Care Teams include your primary Cardiologist (physician) and Advanced Practice Providers (APPs -  Physician Assistants and Nurse Practitioners) who all work together to provide you with the care you need, when you need it. You will need a follow up appointment in 1 months.  Please call our office 2 months in advance to schedule this appointment.  You may see No primary care provider on file. or another member of our [employer] Provider Team in [place]: Norman Herrlich, MD . Belva Crome, MD  Any Other Special Instructions Will Be Listed Below (If Applicable).

## 2018-06-30 NOTE — Progress Notes (Signed)
Cardiology Office Note:    Date:  06/30/2018   ID:  Gail Olson, DOB [birth year], MRN [id number]  PCP:  Shellia Cleverly, PA  Cardiologist:  Gypsy Balsam, MD    Referring MD: Shellia Cleverly, [place]   No chief complaint on file. Other chest pain Monday night  History of Present Illness:    Gail Olson is a 68 y.o. female who comes today to my office for follow-up.  Initially she was scheduled as a video visit but after conversation with her I realized that she need to be seen in person EKG need to be done in troponin I need to be done as well on Monday evening she started having extraordinarily severe heartburn she was pale at the time she also got some shortness of breath.  That sensation lasted for all night she did try all different antiacid with limited success.  Her daughter is a Engineer, civil (consulting) and her she advised her against going to the hospital. She felt a little better but still has some pain and now she is doing well asymptomatic right now.  Described usual exertional shortness of breath but otherwise seems to be doing fine  Past Medical History:  Diagnosis Date  . Arthritis   . DVT (deep venous thrombosis) (HCC)   . Pulmonary embolism (HCC)   . Thyroid disease     Past Surgical History:  Procedure Laterality Date  . APPENDECTOMY    . BACK SURGERY    . CHOLECYSTECTOMY    . OVARY SURGERY      Current Medications: Current Meds  Medication Sig  . acetaminophen (TYLENOL) 650 MG CR tablet Take 650 mg by mouth every 8 (eight) hours as needed for pain.  Marland Kitchen albuterol (PROAIR HFA) 108 (90 Base) MCG/ACT inhaler Inhale 2 puffs into the lungs See admin instructions. 2 puffs every four to six hours as needed for shortness of breath  . betamethasone dipropionate (DIPROLENE) 0.05 % ointment Apply 1 application topically 2 (two) times daily as needed (to affected areas).   Marland Kitchen ELIQUIS 5 MG TABS tablet TAKE 1 TABLET BY MOUTH TWICE A DAY  . fluticasone (FLONASE) 50 MCG/ACT nasal spray  PLACE 2 SPRAYS INTO BOTH NOSTRILS AT BEDTIME AS NEEDED FOR ALLERGIES OR RHINITIS. (Patient taking differently: Place 2 sprays into both nostrils 2 (two) times daily. )  . fluticasone (FLOVENT HFA) 110 MCG/ACT inhaler Inhale 2 puffs into the lungs 2 (two) times daily as needed (Cough, wheeze, chest congestion, or shortness of breath).  . hydrochlorothiazide (MICROZIDE) 12.5 MG capsule Take 12.5 mg by mouth daily as needed for edema or fluid.  Marland Kitchen HYDROcodone-acetaminophen (NORCO/VICODIN) 5-325 MG tablet Take 1 tablet by mouth every 8 (eight) hours as needed for pain.  . Lactobacillus (PROBIOTIC ACIDOPHILUS PO) Take 1 capsule by mouth daily.  Marland Kitchen levothyroxine (SYNTHROID, LEVOTHROID) 50 MCG tablet Take 50 mcg by mouth daily.   Marland Kitchen loratadine (CLARITIN) 10 MG tablet Take 1 tablet (10 mg total) by mouth daily.  . Magnesium 250 MG TABS Take 1 tablet by mouth daily.  . methocarbamol (ROBAXIN) 750 MG tablet Take 750 mg by mouth 3 (three) times daily as needed for muscle spasms.   . Metoprolol Succinate 50 MG CS24 Take 1 tablet by mouth daily.  . montelukast (SINGULAIR) 10 MG tablet TAKE 1 TABLET BY MOUTH EVERYDAY AT BEDTIME  . omeprazole (PRILOSEC) 20 MG capsule Take 20 mg by mouth daily as needed (for reflux).   Marland Kitchen OVER THE COUNTER MEDICATION Remefemin tablets:  Take 1 tablet by mouth two to three times a week  . Spacer/Aero-Holding Chambers (AEROCHAMBER MV) inhaler Use as instructed  . UNABLE TO FIND Compression stockings, daily  . vitamin B-12 (CYANOCOBALAMIN) 1000 MCG tablet Take 1,000 mcg by mouth daily.  . Vitamin D, Ergocalciferol, (DRISDOL) 50000 units CAPS capsule Take 50,000 Units by mouth once a week.     Allergies:   Nsaids; Other; Prednisone; Risedronate; Adhesive [tape]; Codeine; and Penicillins   Social History   Socioeconomic History  . Marital status: Married    Spouse name: Not on file  . Number of children: Not on file  . Years of education: Not on file  . Highest education level: Not  on file  Occupational History  . Not on file  Social Needs  . Financial resource strain: Not on file  . Food insecurity:    Worry: Not on file    Inability: Not on file  . Transportation needs:    Medical: Not on file    Non-medical: Not on file  Tobacco Use  . Smoking status: Never Smoker  . Smokeless tobacco: Never Used  Substance and Sexual Activity  . Alcohol use: No    Frequency: Never  . Drug use: No  . Sexual activity: Not on file  Lifestyle  . Physical activity:    Days per week: Not on file    Minutes per session: Not on file  . Stress: Not on file  Relationships  . Social connections:    Talks on phone: Not on file    Gets together: Not on file    Attends religious service: Not on file    Active member of club or organization: Not on file    Attends meetings of clubs or organizations: Not on file    Relationship status: Not on file  Other Topics Concern  . Not on file  Social History Narrative  . Not on file     Family History: The patient's family history includes Cancer in her mother; Hypertension in her brother, mother, and sister. There is no history of Pulmonary disease. ROS:   Please see the history of present illness.    All 14 point review of systems negative except as described per history of present illness  EKGs/Labs/Other Studies Reviewed:      Recent Labs: 10/07/2017: ALT 23; B Natriuretic Peptide 19.0; BUN 15; Creatinine, Ser 0.93; Hemoglobin 14.7; Platelets 218; Potassium 3.6; Sodium 138; TSH 3.178  Recent Lipid Panel No results found for: CHOL, TRIG, HDL, CHOLHDL, VLDL, LDLCALC, LDLDIRECT  Physical Exam:    VS:  Wt 238 lb (108 kg)   BMI 38.41 kg/m     Wt Readings from Last 3 Encounters:  06/30/18 238 lb (108 kg)  01/12/18 245 lb 12.8 oz (111.5 kg)  11/20/17 251 lb 9.6 oz (114.1 kg)     GEN:  Well nourished, well developed in no acute distress HEENT: Normal NECK: No JVD; No carotid bruits LYMPHATICS: No lymphadenopathy  CARDIAC: RRR, no murmurs, no rubs, no gallops RESPIRATORY:  Clear to auscultation without rales, wheezing or rhonchi  ABDOMEN: Soft, non-tender, non-distended MUSCULOSKELETAL:  No edema; No deformity  SKIN: Warm and dry LOWER EXTREMITIES: no swelling NEUROLOGIC:  Alert and oriented x 3 PSYCHIATRIC:  Normal affect   ASSESSMENT:    1. Atypical chest pain   2. Chest pain, unspecified type   3. Pulmonary artery hypertension (HCC)   4. Palpitations   5. Dyspnea on exertion    PLAN:  In order of problems listed above:  1. Chest pain concerning EKG showed no acute changes identical to the EKG done before.  She is on appropriate medication which I will continue troponin I will be done today.  Then decision will be made what to do next. 2. Pulmonary hypertension.  Noted last echocardiogram showed stable. 3. Palpitations denies having any 4. Dyspnea on exertion stable.   Medication Adjustments/Labs and Tests Ordered: Current medicines are reviewed at length with the patient today.  Concerns regarding medicines are outlined above.  Orders Placed This Encounter  Procedures  . Troponin I  . EKG 12-Lead   Medication changes: No orders of the defined types were placed in this encounter.   Signed, Georgeanna Lea, MD, [facility] 06/30/2018 4:31 PM    [facility]

## 2018-07-01 LAB — TROPONIN I: Troponin I: <0.01 ng/mL (ref 0.00–0.04)

## 2018-07-21 DIAGNOSIS — Z9989 Dependence on other enabling machines and devices: Secondary | ICD-10-CM | POA: Diagnosis not present

## 2018-07-21 DIAGNOSIS — E039 Hypothyroidism, unspecified: Secondary | ICD-10-CM | POA: Diagnosis not present

## 2018-07-21 DIAGNOSIS — R002 Palpitations: Secondary | ICD-10-CM | POA: Diagnosis not present

## 2018-07-21 DIAGNOSIS — G4733 Obstructive sleep apnea (adult) (pediatric): Secondary | ICD-10-CM | POA: Diagnosis not present

## 2018-07-21 DIAGNOSIS — E538 Deficiency of other specified B group vitamins: Secondary | ICD-10-CM | POA: Diagnosis not present

## 2018-07-21 DIAGNOSIS — E559 Vitamin D deficiency, unspecified: Secondary | ICD-10-CM | POA: Diagnosis not present

## 2018-07-21 DIAGNOSIS — R7301 Impaired fasting glucose: Secondary | ICD-10-CM | POA: Diagnosis not present

## 2018-07-21 DIAGNOSIS — I10 Essential (primary) hypertension: Secondary | ICD-10-CM | POA: Diagnosis not present

## 2018-07-21 DIAGNOSIS — Z0000 Encounter for general adult medical examination without abnormal findings: Secondary | ICD-10-CM | POA: Diagnosis not present

## 2018-07-23 DIAGNOSIS — J42 Unspecified chronic bronchitis: Secondary | ICD-10-CM | POA: Diagnosis not present

## 2018-07-23 DIAGNOSIS — I2782 Chronic pulmonary embolism: Secondary | ICD-10-CM | POA: Diagnosis not present

## 2018-07-23 DIAGNOSIS — G4733 Obstructive sleep apnea (adult) (pediatric): Secondary | ICD-10-CM | POA: Diagnosis not present

## 2018-07-23 DIAGNOSIS — J410 Simple chronic bronchitis: Secondary | ICD-10-CM | POA: Diagnosis not present

## 2018-07-30 ENCOUNTER — Encounter: Payer: Self-pay | Admitting: Cardiology

## 2018-07-30 ENCOUNTER — Other Ambulatory Visit: Payer: Self-pay

## 2018-07-30 ENCOUNTER — Telehealth (INDEPENDENT_AMBULATORY_CARE_PROVIDER_SITE_OTHER): Payer: PPO | Admitting: Cardiology

## 2018-07-30 VITALS — HR 72 | Wt 239.0 lb

## 2018-07-30 DIAGNOSIS — I2721 Secondary pulmonary arterial hypertension: Secondary | ICD-10-CM | POA: Diagnosis not present

## 2018-07-30 DIAGNOSIS — Z86711 Personal history of pulmonary embolism: Secondary | ICD-10-CM

## 2018-07-30 DIAGNOSIS — K219 Gastro-esophageal reflux disease without esophagitis: Secondary | ICD-10-CM

## 2018-07-30 DIAGNOSIS — R0609 Other forms of dyspnea: Secondary | ICD-10-CM

## 2018-07-30 DIAGNOSIS — R0789 Other chest pain: Secondary | ICD-10-CM

## 2018-07-30 HISTORY — DX: Personal history of pulmonary embolism: Z86.711

## 2018-07-30 NOTE — Progress Notes (Signed)
Virtual Visit via Telephone Note   This visit type was conducted due to national recommendations for restrictions regarding the COVID-19 Pandemic (e.g. social distancing) in an effort to limit this patient's exposure and mitigate transmission in our community.  Due to her co-morbid illnesses, this patient is at least at moderate risk for complications without adequate follow up.  This format is felt to be most appropriate for this patient at this time.  The patient did not have access to video technology/had technical difficulties with video requiring transitioning to audio format only (telephone).  All issues noted in this document were discussed and addressed.  No physical exam could be performed with this format.  Please refer to the patient's chart for her  consent to [facility].  Evaluation Performed:  Follow-up visit  This visit type was conducted due to national recommendations for restrictions regarding the COVID-19 Pandemic (e.g. social distancing).  This format is felt to be most appropriate for this patient at this time.  All issues noted in this document were discussed and addressed.  No physical exam was performed (except for noted visual exam findings with Video Visits).  Please refer to the patient's chart (MyChart message for video visits and phone note for telephone visits) for the patient's consent to telehealth [facility].  Date:  07/30/2018  ID: Gail Olson, DOB 08-Jun-1950, MRN [id number]   Patient Location: [address] [facility] [address] [facility] [place] [id number]   [facility]   [facility] [place] [facility]  PCP:  Shellia CleverlyBeane, Lori M, [place]  Cardiologist:  Gypsy Balsamobert Krasowski, MD     Chief Complaint: I am doing better  History of Present Illness:    Gail DullBrenda H Olson is a 68 y.o. female  who presents via audio/video conferencing for a telehealth visit today.  With history of pulmonary emboli few years ago, also DVT, arthritis I seen her about a week ago  because of chest pain pain was very atypical however we decided to order troponin I which came normal.  Since that time I seen her last time she described to have 2 episodes very short lasting episode of pain on the second that happened at rest the same time she is trying to be active however she does have problem with her arthritic pain and walking is somewhat difficult to her.  Overall she is feeling better we discussed in length what to do with the situation where when thinking about potentially doing stress test however she said she is fine and she would like to continue doing what we doing so far.  I told her that it would be reasonable to perform echocardiogram to assess pulmonary pressure still there is a long waiting time for echocardiogram so I will order it now.   The patient does not have symptoms concerning for COVID-19 infection (fever, chills, cough, or new SHORTNESS OF BREATH).    Prior CV studies:   The following studies were reviewed today:  Troponin I was negative     Past Medical History:  Diagnosis Date  . Arthritis   . DVT (deep venous thrombosis) (HCC)   . Pulmonary embolism (HCC)   . Thyroid disease     Past Surgical History:  Procedure Laterality Date  . APPENDECTOMY    . BACK SURGERY    . CHOLECYSTECTOMY    . OVARY SURGERY       Current Meds  Medication Sig  . acetaminophen (TYLENOL) 650 MG CR tablet Take 650 mg by  mouth every 8 (eight) hours as needed for pain.  Marland Kitchen albuterol (PROAIR HFA) 108 (90 Base) MCG/ACT inhaler Inhale 2 puffs into the lungs See admin instructions. 2 puffs every four to six hours as needed for shortness of breath  . betamethasone dipropionate (DIPROLENE) 0.05 % ointment Apply 1 application topically 2 (two) times daily as needed (to affected areas).   Marland Kitchen ELIQUIS 5 MG TABS tablet TAKE 1 TABLET BY MOUTH TWICE A DAY  . fluticasone (FLONASE) 50 MCG/ACT nasal spray PLACE 2 SPRAYS INTO BOTH NOSTRILS AT BEDTIME AS NEEDED FOR ALLERGIES OR  RHINITIS. (Patient taking differently: Place 2 sprays into both nostrils 2 (two) times daily. )  . fluticasone (FLOVENT HFA) 110 MCG/ACT inhaler Inhale 2 puffs into the lungs 2 (two) times daily as needed (Cough, wheeze, chest congestion, or shortness of breath).  . hydrochlorothiazide (MICROZIDE) 12.5 MG capsule Take 12.5 mg by mouth daily as needed for edema or fluid.  Marland Kitchen HYDROcodone-acetaminophen (NORCO/VICODIN) 5-325 MG tablet Take 1 tablet by mouth every 8 (eight) hours as needed for pain.  . Lactobacillus (PROBIOTIC ACIDOPHILUS PO) Take 1 capsule by mouth daily.  Marland Kitchen levothyroxine (SYNTHROID, LEVOTHROID) 50 MCG tablet Take 50 mcg by mouth daily.   Marland Kitchen loratadine (CLARITIN) 10 MG tablet Take 1 tablet (10 mg total) by mouth daily. (Patient taking differently: Take 10 mg by mouth 2 (two) times daily. )  . Magnesium 250 MG TABS Take 2 tablets by mouth daily.   . methocarbamol (ROBAXIN) 750 MG tablet Take 750 mg by mouth 3 (three) times daily as needed for muscle spasms.   . Metoprolol Succinate 50 MG CS24 Take 1 tablet by mouth daily.  . montelukast (SINGULAIR) 10 MG tablet TAKE 1 TABLET BY MOUTH EVERYDAY AT BEDTIME  . omeprazole (PRILOSEC) 20 MG capsule Take 20 mg by mouth daily as needed (for reflux).   Marland Kitchen Spacer/Aero-Holding Chambers (AEROCHAMBER MV) inhaler Use as instructed  . UNABLE TO FIND Compression stockings, daily  . vitamin B-12 (CYANOCOBALAMIN) 1000 MCG tablet Take 1,000 mcg by mouth 2 (two) times daily.   . Vitamin D, Ergocalciferol, (DRISDOL) 50000 units CAPS capsule Take 50,000 Units by mouth once a week.      Family History: The patient's family history includes Cancer in her mother; Hypertension in her brother, mother, and sister. There is no history of Pulmonary disease.   ROS:   Please see the history of present illness.     All other systems reviewed and are negative.   Labs/Other Tests and Data Reviewed:     Recent Labs: 10/07/2017: ALT 23; B Natriuretic Peptide 19.0;  BUN 15; Creatinine, Ser 0.93; Hemoglobin 14.7; Platelets 218; Potassium 3.6; Sodium 138; TSH 3.178  Recent Lipid Panel No results found for: CHOL, TRIG, HDL, CHOLHDL, VLDL, LDLCALC, LDLDIRECT    Exam:    Vital Signs:  Pulse 72   Wt 239 lb (108.4 kg)   SpO2 97%   BMI 38.58 kg/m     Wt Readings from Last 3 Encounters:  07/30/18 239 lb (108.4 kg)  06/30/18 238 lb (108 kg)  01/12/18 245 lb 12.8 oz (111.5 kg)     Well nourished, well developed in no acute distress. Alert awake oriented x3.  She had no technical ability to establish video link therefore with talking over the phone.  Denies having any problems at the time of my interview  Diagnosis for this visit:   1. Pulmonary artery hypertension (HCC)   2. Gastroesophageal reflux disease without esophagitis  3. Dyspnea on exertion   4. Atypical chest pain   5. History of pulmonary embolism      ASSESSMENT & PLAN:    1.  Pulmonary hypertension echocardiogram will be repeated to check on that. 2.  Gastroesophageal reflux disease.  Followed by anti-medicine team.  3.  Dyspnea on exertion denies having any we will slow her down when she exercise is back pain rather than shortness of breath. 4.  Atypical chest pain.  We decided to just watch it.  She will let me know if she will have more of pain.  I will schedule her to have echocardiogram to assess pulmonary artery pressure. 5.  Pulmonary emboli history of continue present management.  COVID-19 Education: The signs and symptoms of COVID-19 were discussed with the patient and how to seek care for testing (follow up with PCP or arrange E-visit).  The importance of social distancing was discussed today.  Patient Risk:   After full review of this patients clinical status, I feel that they are at least moderate risk at this time.  Time:   Today, I have spent 16 minutes with the patient with telehealth technology discussing pt health issues.  I spent 5 minutes reviewing her chart  before the visit.  Visit was finished at 2:55 PM.    Medication Adjustments/Labs and Tests Ordered: Current medicines are reviewed at length with the patient today.  Concerns regarding medicines are outlined above.  No orders of the defined types were placed in this encounter.  Medication changes: No orders of the defined types were placed in this encounter.    Disposition: Follow-up 2 months  Signed, Georgeanna Lea, MD, [facility] 07/30/2018 2:54 PM    [facility] '

## 2018-07-30 NOTE — Addendum Note (Signed)
Addended by: Lita Mains on: 07/30/2018 03:04 PM   Modules accepted: Orders

## 2018-07-30 NOTE — Patient Instructions (Signed)
Medication Instructions:  Your physician recommends that you continue on your current medications as directed. Please refer to the Current Medication list given to you today.  If you need a refill on your cardiac medications before your next appointment, please call your pharmacy.   Lab work: None.  If you have labs (blood work) drawn today and your tests are completely normal, you will receive your results only by: . MyChart Message (if you have MyChart) OR . A paper copy in the mail If you have any lab test that is abnormal or we need to change your treatment, we will call you to review the results.  Testing/Procedures: Your physician has requested that you have an echocardiogram. Echocardiography is a painless test that uses sound waves to create images of your heart. It provides your doctor with information about the size and shape of your heart and how well your heart's chambers and valves are working. This procedure takes approximately one hour. There are no restrictions for this procedure.    Follow-Up: At [facility], you and your health needs are our priority.  As part of our continuing mission to provide you with exceptional heart care, we have created designated Provider Care Teams.  These Care Teams include your primary Cardiologist (physician) and Advanced Practice Providers (APPs -  Physician Assistants and Nurse Practitioners) who all work together to provide you with the care you need, when you need it. You will need a follow up appointment in 2 months.  Please call our office 2 months in advance to schedule this appointment.  You may see No primary care provider on file. or another member of our [facility] Provider Team in [place]: Brian Munley, MD . Rajan Revankar, MD  Any Other Special Instructions Will Be Listed Below (If Applicable).   Echocardiogram An echocardiogram is a procedure that uses painless sound waves (ultrasound) to produce an image of the heart.  Images from an echocardiogram can provide important information about:  Signs of coronary artery disease (CAD).  Aneurysm detection. An aneurysm is a weak or damaged part of an artery wall that bulges out from the normal force of blood pumping through the body.  Heart size and shape. Changes in the size or shape of the heart can be associated with certain conditions, including heart failure, aneurysm, and CAD.  Heart muscle function.  Heart valve function.  Signs of a past heart attack.  Fluid buildup around the heart.  Thickening of the heart muscle.  A tumor or infectious growth around the heart valves. Tell a health care provider about:  Any allergies you have.  All medicines you are taking, including vitamins, herbs, eye drops, creams, and over-the-counter medicines.  Any blood disorders you have.  Any surgeries you have had.  Any medical conditions you have.  Whether you are pregnant or may be pregnant. What are the risks? Generally, this is a safe procedure. However, problems may occur, including:  Allergic reaction to dye (contrast) that may be used during the procedure. What happens before the procedure? No specific preparation is needed. You may eat and drink normally. What happens during the procedure?   An IV tube may be inserted into one of your veins.  You may receive contrast through this tube. A contrast is an injection that improves the quality of the pictures from your heart.  A gel will be applied to your chest.  A wand-like tool (transducer) will be moved over your chest. The gel will help to   transmit the sound waves from the transducer.  The sound waves will harmlessly bounce off of your heart to allow the heart images to be captured in real-time motion. The images will be recorded on a computer. The procedure may vary among health care providers and hospitals. What happens after the procedure?  You may return to your normal, everyday life,  including diet, activities, and medicines, unless your health care provider tells you not to do that. Summary  An echocardiogram is a procedure that uses painless sound waves (ultrasound) to produce an image of the heart.  Images from an echocardiogram can provide important information about the size and shape of your heart, heart muscle function, heart valve function, and fluid buildup around your heart.  You do not need to do anything to prepare before this procedure. You may eat and drink normally.  After the echocardiogram is completed, you may return to your normal, everyday life, unless your health care provider tells you not to do that. This information is not intended to replace advice given to you by your health care provider. Make sure you discuss any questions you have with your health care provider. Document Released: 02/15/2000 Document Revised: 03/22/2016 Document Reviewed: 03/22/2016 Elsevier Interactive Patient Education  2019 Elsevier Inc.    

## 2018-08-23 ENCOUNTER — Other Ambulatory Visit: Payer: PPO

## 2018-09-23 ENCOUNTER — Other Ambulatory Visit: Payer: Self-pay

## 2018-09-23 ENCOUNTER — Telehealth (INDEPENDENT_AMBULATORY_CARE_PROVIDER_SITE_OTHER): Payer: PPO | Admitting: Cardiology

## 2018-09-23 ENCOUNTER — Encounter: Payer: Self-pay | Admitting: Cardiology

## 2018-09-23 VITALS — Wt 240.0 lb

## 2018-09-23 DIAGNOSIS — I2721 Secondary pulmonary arterial hypertension: Secondary | ICD-10-CM

## 2018-09-23 DIAGNOSIS — R0609 Other forms of dyspnea: Secondary | ICD-10-CM

## 2018-09-23 DIAGNOSIS — R0789 Other chest pain: Secondary | ICD-10-CM

## 2018-09-23 NOTE — Progress Notes (Signed)
Virtual Visit via Telephone Note   Gail visit type was conducted due to national recommendations for restrictions regarding Gail COVID-19 Pandemic (e.g. social distancing) in an effort to limit Gail Olson's exposure and mitigate transmission in our community.  Due to Gail Olson co-morbid illnesses, Gail Olson is at least at moderate risk for complications without adequate follow up.  Gail format is felt to be most appropriate for Gail Olson at Gail time.  Gail Olson did not have access to video technology/had technical difficulties with video requiring transitioning to audio format only (telephone).  All issues noted in Gail document were discussed and addressed.  No physical exam could be performed with Gail format.  Please refer to Gail Olson's chart for Gail Olson  consent to telehealth for [facility].  Evaluation Performed:  Follow-up visit  Gail visit type was conducted due to national recommendations for restrictions regarding Gail COVID-19 Pandemic (e.g. social distancing).  Gail format is felt to be most appropriate for Gail Olson at Gail time.  All issues noted in Gail document were discussed and addressed.  No physical exam was performed (except for noted visual exam findings with Video Visits).  Please refer to Gail Olson's chart (MyChart message for video visits and phone note for telephone visits) for Gail Olson's consent to telehealth for [facility].  Date:  09/23/2018  ID: Gail Olson, DOB 09/30/1950, MRN [id number]   Olson Location: [place] [address]   Provider location:   [place] Office  PCP:  Manfred Shirts, [place]  Cardiologist:  Jenne Campus, MD     Chief Complaint: Doing better from heart point of view  History of Present Illness:    [name] is a 68 y.o. female  who presents via audio/video conferencing for a telehealth visit today.  With history of DVT and PE, she is anticoagulated, I spoke to Gail Olson a few months  ago because she was complaining of having some chest pain.  Pain was very atypical short not related to exercise but concerning.  Gail purpose of Gail visit was to find out how she is doing in cardiac wise she is doing very well.  She denies having any chest pain tightness squeezing pressure burning chest.  Because of history of pulmonary hypertension secondary to most likely pulmonary emboli she was scheduled to have repeated echocardiogram however did not do it because of coronavirus situation overall she says she is doing well and she still want to postpone Gail test.  Therefore we will continue present management.   Gail Olson does not have symptoms concerning for COVID-19 infection (fever, chills, cough, or new SHORTNESS OF BREATH).    Prior CV studies:   Gail following studies were reviewed today:       Past Medical History:  Diagnosis Date  . Arthritis   . DVT (deep venous thrombosis) ([place])   . Pulmonary embolism ([place])   . Thyroid disease     Past Surgical History:  Procedure Laterality Date  . APPENDECTOMY    . BACK SURGERY    . CHOLECYSTECTOMY    . OVARY SURGERY       Current Meds  Medication Sig  . acetaminophen (TYLENOL) 650 MG CR tablet Take 650 mg by mouth every 8 (eight) hours as needed for pain.  Marland Kitchen albuterol (PROAIR HFA) 108 (90 Base) MCG/ACT inhaler Inhale 2 puffs into Gail lungs See admin instructions. 2 puffs every four to six hours as needed for shortness  of breath  . betamethasone dipropionate (DIPROLENE) 0.05 % ointment Apply 1 application topically 2 (two) times daily as needed (to affected areas).   . Magnesium 250 MG TABS Take 2 tablets by mouth daily.   . vitamin B-12 (CYANOCOBALAMIN) 1000 MCG tablet Take 1,000 mcg by mouth 2 (two) times daily.   . Vitamin D, Ergocalciferol, (DRISDOL) 50000 units CAPS capsule Take 50,000 Units by mouth once a week.      Family History: Gail Olson's family history includes Cancer in Gail Olson mother; Hypertension in Gail Olson  brother, mother, and sister. There is no history of Pulmonary disease.   ROS:   Please see Gail history of present illness.     All other systems reviewed and are negative.   Labs/Other Tests and Data Reviewed:     Recent Labs: 10/07/2017: ALT 23; B Natriuretic Peptide 19.0; BUN 15; Creatinine, Ser 0.93; Hemoglobin 14.7; Platelets 218; Potassium 3.6; Sodium 138; TSH 3.178  Recent Lipid Panel No results found for: CHOL, TRIG, HDL, CHOLHDL, VLDL, LDLCALC, LDLDIRECT    Exam:    Vital Signs:  Wt 240 lb (108.9 kg)   BMI 38.74 kg/m     Wt Readings from Last 3 Encounters:  09/23/18 240 lb (108.9 kg)  07/30/18 239 lb (108.4 kg)  06/30/18 238 lb (108 kg)     Well nourished, well developed in no acute distress. Alert awake and attentive 3 unable to establish video link therefore I talked to Gail Olson over Gail phone not in any distress she is at home  Diagnosis for Gail visit:   1. Pulmonary artery hypertension (HCC)   2. Atypical chest pain   3. Dyspnea on exertion      ASSESSMENT & PLAN:    1.  Pulmonary hypertension.  Overall doing well denies have any swelling of lower extremities no exertional shortness of breath she required echocardiogram however want to postpone it for 2 more months.  We will do that since clinically she is doing well. 2.  Atypical chest pain denies having any 3.  Dyspnea on exertion overall doing better from that point of view but overall Gail Olson ability to exercise is limited because of chronic back as well as hip problem.  COVID-19 Education: Gail signs and symptoms of COVID-19 were discussed with Gail Olson and how to seek care for testing (follow up with PCP or arrange E-visit).  Gail importance of social distancing was discussed today.  Olson Risk:   After full review of Gail patients clinical status, I feel that they are at least moderate risk at Gail time.  Time:   Today, I have spent 16 minutes with Gail Olson with telehealth technology discussing pt  health issues.  I spent 5 minutes reviewing Gail Olson chart before Gail visit.  Visit was finished at 11:05 AM.    Medication Adjustments/Labs and Tests Ordered: Current medicines are reviewed at length with Gail Olson today.  Concerns regarding medicines are outlined above.  No orders of Gail defined types were placed in Gail encounter.  Medication changes: No orders of Gail defined types were placed in Gail encounter.    Disposition: Follow-up in 3 months  Signed, Georgeanna Leaobert J. Marqus Macphee, MD, [facility] 09/23/2018 11:05 AM    [facility]

## 2018-09-23 NOTE — Patient Instructions (Signed)
Medication Instructions:  Your physician recommends that you continue on your current medications as directed. Please refer to the Current Medication list given to you today.  If you need a refill on your cardiac medications before your next appointment, please call your pharmacy.   Lab work: None.  If you have labs (blood work) drawn today and your tests are completely normal, you will receive your results only by: . MyChart Message (if you have MyChart) OR . A paper copy in the mail If you have any lab test that is abnormal or we need to change your treatment, we will call you to review the results.  Testing/Procedures: None.   Follow-Up: At [facility], you and your health needs are our priority.  As part of our continuing mission to provide you with exceptional heart care, we have created designated Provider Care Teams.  These Care Teams include your primary Cardiologist (physician) and Advanced Practice Providers (APPs -  Physician Assistants and Nurse Practitioners) who all work together to provide you with the care you need, when you need it. You will need a follow up appointment in 3 months.  Please call our office 2 months in advance to schedule this appointment.  You may see No primary care provider on file. or another member of our [facility] Provider Team in [place]: Brian Munley, MD . Rajan Revankar, MD  Any Other Special Instructions Will Be Listed Below (If Applicable).     

## 2018-11-25 DIAGNOSIS — M25512 Pain in left shoulder: Secondary | ICD-10-CM | POA: Diagnosis not present

## 2018-11-30 DIAGNOSIS — M7552 Bursitis of left shoulder: Secondary | ICD-10-CM | POA: Diagnosis not present

## 2018-11-30 DIAGNOSIS — E039 Hypothyroidism, unspecified: Secondary | ICD-10-CM | POA: Diagnosis not present

## 2018-11-30 DIAGNOSIS — R7301 Impaired fasting glucose: Secondary | ICD-10-CM | POA: Diagnosis not present

## 2018-11-30 DIAGNOSIS — I10 Essential (primary) hypertension: Secondary | ICD-10-CM | POA: Diagnosis not present

## 2018-11-30 DIAGNOSIS — Z23 Encounter for immunization: Secondary | ICD-10-CM | POA: Diagnosis not present

## 2018-11-30 DIAGNOSIS — E538 Deficiency of other specified B group vitamins: Secondary | ICD-10-CM | POA: Diagnosis not present

## 2018-11-30 DIAGNOSIS — E559 Vitamin D deficiency, unspecified: Secondary | ICD-10-CM | POA: Diagnosis not present

## 2018-11-30 DIAGNOSIS — K219 Gastro-esophageal reflux disease without esophagitis: Secondary | ICD-10-CM | POA: Diagnosis not present

## 2018-12-27 ENCOUNTER — Ambulatory Visit: Payer: PPO | Admitting: Cardiology

## 2019-02-04 DIAGNOSIS — R0981 Nasal congestion: Secondary | ICD-10-CM | POA: Diagnosis not present

## 2019-02-04 DIAGNOSIS — M79605 Pain in left leg: Secondary | ICD-10-CM | POA: Diagnosis not present

## 2019-02-04 DIAGNOSIS — Z20828 Contact with and (suspected) exposure to other viral communicable diseases: Secondary | ICD-10-CM | POA: Diagnosis not present

## 2019-02-04 DIAGNOSIS — J0100 Acute maxillary sinusitis, unspecified: Secondary | ICD-10-CM | POA: Diagnosis not present

## 2019-02-04 DIAGNOSIS — Z86718 Personal history of other venous thrombosis and embolism: Secondary | ICD-10-CM | POA: Diagnosis not present

## 2019-02-04 DIAGNOSIS — M1712 Unilateral primary osteoarthritis, left knee: Secondary | ICD-10-CM | POA: Diagnosis not present

## 2019-02-04 DIAGNOSIS — I82512 Chronic embolism and thrombosis of left femoral vein: Secondary | ICD-10-CM | POA: Diagnosis not present

## 2019-02-04 DIAGNOSIS — R0982 Postnasal drip: Secondary | ICD-10-CM | POA: Diagnosis not present

## 2019-02-09 ENCOUNTER — Encounter: Payer: Self-pay | Admitting: Cardiology

## 2019-02-09 ENCOUNTER — Ambulatory Visit (INDEPENDENT_AMBULATORY_CARE_PROVIDER_SITE_OTHER): Payer: PPO | Admitting: Cardiology

## 2019-02-09 ENCOUNTER — Other Ambulatory Visit: Payer: Self-pay

## 2019-02-09 VITALS — BP 130/70 | HR 80 | Ht 66.0 in | Wt 238.0 lb

## 2019-02-09 DIAGNOSIS — R0789 Other chest pain: Secondary | ICD-10-CM | POA: Diagnosis not present

## 2019-02-09 DIAGNOSIS — I2721 Secondary pulmonary arterial hypertension: Secondary | ICD-10-CM

## 2019-02-09 DIAGNOSIS — R0609 Other forms of dyspnea: Secondary | ICD-10-CM

## 2019-02-09 DIAGNOSIS — R0600 Dyspnea, unspecified: Secondary | ICD-10-CM

## 2019-02-09 DIAGNOSIS — G4733 Obstructive sleep apnea (adult) (pediatric): Secondary | ICD-10-CM | POA: Diagnosis not present

## 2019-02-09 DIAGNOSIS — Z86711 Personal history of pulmonary embolism: Secondary | ICD-10-CM

## 2019-02-09 NOTE — Addendum Note (Signed)
Addended by: Aleatha Borer on: 02/09/2019 08:55 AM   Modules accepted: Orders

## 2019-02-09 NOTE — Progress Notes (Signed)
Cardiology Office Note:    Date:  02/09/2019   ID:  NEESHA LANGTON, DOB 1950-07-21, MRN [id number]  PCP:  Shellia Cleverly, PA  Cardiologist:  Gypsy Balsam, MD    Referring MD: Shellia Cleverly, [place]   Chief Complaint  Patient presents with  . Follow-up  I have pain in my left leg  History of Present Illness:    Gail Olson is a 68 y.o. female with history of DVT and PE, she does have mild elevation of pulmonary pressure.  She comes to the [facility] for follow-up.  Recently she started experiencing pain in her left knee quite extensive evaluation has been done which includes a DVT study which showed some chronic DVT involving femoral veins otherwise there is no acute issues.  She is still anticoagulated.  She still described to have some exertional shortness of breath.  In the summer we were contemplating doing echocardiogram, however, she decided not to do with being scared of coronavirus.  Because of exertional shortness of breath that she got I insist on getting this test done.  Past Medical History:  Diagnosis Date  . Arthritis   . DVT (deep venous thrombosis) (HCC)   . Pulmonary embolism (HCC)   . Thyroid disease     Past Surgical History:  Procedure Laterality Date  . APPENDECTOMY    . BACK SURGERY    . CHOLECYSTECTOMY    . OVARY SURGERY      Current Medications: Current Meds  Medication Sig  . acetaminophen (TYLENOL) 650 MG CR tablet Take 650 mg by mouth every 8 (eight) hours as needed for pain.  Marland Kitchen albuterol (PROAIR HFA) 108 (90 Base) MCG/ACT inhaler Inhale 2 puffs into the lungs See admin instructions. 2 puffs every four to six hours as needed for shortness of breath  . betamethasone dipropionate (DIPROLENE) 0.05 % ointment Apply 1 application topically 2 (two) times daily as needed (to affected areas).   Marland Kitchen doxycycline (DORYX) 100 MG EC tablet Take 100 mg by mouth 2 (two) times daily.  Marland Kitchen ELIQUIS 5 MG TABS tablet TAKE 1 TABLET BY MOUTH TWICE A DAY  . fluticasone  (FLONASE) 50 MCG/ACT nasal spray PLACE 2 SPRAYS INTO BOTH NOSTRILS AT BEDTIME AS NEEDED FOR ALLERGIES OR RHINITIS. (Patient taking differently: Place 2 sprays into both nostrils 2 (two) times daily. )  . fluticasone (FLOVENT HFA) 110 MCG/ACT inhaler Inhale 2 puffs into the lungs 2 (two) times daily as needed (Cough, wheeze, chest congestion, or shortness of breath).  . hydrochlorothiazide (MICROZIDE) 12.5 MG capsule Take 12.5 mg by mouth daily as needed for edema or fluid.  Marland Kitchen HYDROcodone-acetaminophen (NORCO/VICODIN) 5-325 MG tablet Take 1 tablet by mouth every 8 (eight) hours as needed for pain.  . Lactobacillus (PROBIOTIC ACIDOPHILUS PO) Take 1 capsule by mouth daily.  Marland Kitchen levothyroxine (SYNTHROID, LEVOTHROID) 50 MCG tablet Take 50 mcg by mouth daily.   Marland Kitchen loratadine (CLARITIN) 10 MG tablet Take 1 tablet (10 mg total) by mouth daily. (Patient taking differently: Take 10 mg by mouth 2 (two) times daily. )  . Magnesium 250 MG TABS Take 2 tablets by mouth daily.   . methocarbamol (ROBAXIN) 750 MG tablet Take 750 mg by mouth 3 (three) times daily as needed for muscle spasms.   . Metoprolol Succinate 50 MG CS24 Take 1 tablet by mouth daily.  . montelukast (SINGULAIR) 10 MG tablet TAKE 1 TABLET BY MOUTH EVERYDAY AT BEDTIME  . omeprazole (PRILOSEC) 20 MG capsule Take 20 mg by  mouth daily as needed (for reflux).   Marland Kitchen. OVER THE COUNTER MEDICATION Remefemin tablets: Take 1 tablet by mouth two to three times a week  . Spacer/Aero-Holding Chambers (AEROCHAMBER MV) inhaler Use as instructed  . UNABLE TO FIND Compression stockings, daily  . vitamin B-12 (CYANOCOBALAMIN) 1000 MCG tablet Take 1,000 mcg by mouth 2 (two) times daily.   . Vitamin D, Ergocalciferol, (DRISDOL) 50000 units CAPS capsule Take 50,000 Units by mouth once a week.     Allergies:   Nsaids, Other, Prednisone, Risedronate, Adhesive [tape], Codeine, and Penicillins   Social History   Socioeconomic History  . Marital status: Married    Spouse  name: Not on file  . Number of children: Not on file  . Years of education: Not on file  . Highest education level: Not on file  Occupational History  . Not on file  Social Needs  . Financial resource strain: Not on file  . Food insecurity    Worry: Not on file    Inability: Not on file  . Transportation needs    Medical: Not on file    Non-medical: Not on file  Tobacco Use  . Smoking status: Never Smoker  . Smokeless tobacco: Never Used  Substance and Sexual Activity  . Alcohol use: No    Frequency: Never  . Drug use: No  . Sexual activity: Not on file  Lifestyle  . Physical activity    Days per week: Not on file    Minutes per session: Not on file  . Stress: Not on file  Relationships  . Social Musicianconnections    Talks on phone: Not on file    Gets together: Not on file    Attends religious service: Not on file    Active member of club or organization: Not on file    Attends meetings of clubs or organizations: Not on file    Relationship status: Not on file  Other Topics Concern  . Not on file  Social History Narrative  . Not on file     Family History: The patient's family history includes Cancer in her mother; Hypertension in her brother, mother, and sister. There is no history of Pulmonary disease. ROS:   Please see the history of present illness.    All 14 point review of systems negative except as described per history of present illness  EKGs/Labs/Other Studies Reviewed:      Recent Labs: No results found for requested labs within last 8760 hours.  Recent Lipid Panel No results found for: CHOL, TRIG, HDL, CHOLHDL, VLDL, LDLCALC, LDLDIRECT  Physical Exam:    VS:  BP 130/70   Pulse 80   Ht 5\' 6"  (1.676 m)   Wt 238 lb (108 kg) Comment: Reported, wheel chair  SpO2 98%   BMI 38.41 kg/m     Wt Readings from Last 3 Encounters:  02/09/19 238 lb (108 kg)  09/23/18 240 lb (108.9 kg)  07/30/18 239 lb (108.4 kg)     GEN:  Well nourished, well developed  in no acute distress HEENT: Normal NECK: No JVD; No carotid bruits LYMPHATICS: No lymphadenopathy CARDIAC: RRR, no murmurs, no rubs, no gallops RESPIRATORY:  Clear to auscultation without rales, wheezing or rhonchi  ABDOMEN: Soft, non-tender, non-distended MUSCULOSKELETAL:  No edema; No deformity  SKIN: Warm and dry LOWER EXTREMITIES: no swelling NEUROLOGIC:  Alert and oriented x 3 PSYCHIATRIC:  Normal affect   ASSESSMENT:    1. Pulmonary artery hypertension (HCC)  2. OSA (obstructive sleep apnea)   3. History of pulmonary embolism   4. Dyspnea on exertion   5. Atypical chest pain    PLAN:    In order of problems listed above:  1. History of pulmonary emboli with mild pulmonary hypertension.  Echocardiogram will be done to reassess the situation. 2. Obstructive sleep apnea to be followed by internal medicine team. 3. Dyspnea on exertion probably multifactorial and again echocardiogram will be done to assess the situation. 4. Atypical chest pain denies having any. 5. Pain in the left lower extremity DVT study shows some chronic DVT but no acute findings.  I recommend continuation of anticoagulation.  Apparently she is scheduled to see vascular surgeon at the beginning of January.  Overall looks looks quite good.  There is no significant swelling at the moment of my evaluation.  Pulses present but weak   Medication Adjustments/Labs and Tests Ordered: Current medicines are reviewed at length with the patient today.  Concerns regarding medicines are outlined above.  No orders of the defined types were placed in this encounter.  Medication changes: No orders of the defined types were placed in this encounter.   Signed, Park Liter, MD, [facility] 02/09/2019 8:46 AM    Jul[place]

## 2019-02-09 NOTE — Patient Instructions (Signed)
Medication Instructions:  Your physician recommends that you continue on your current medications as directed. Please refer to the Current Medication list given to you today.  *If you need a refill on your cardiac medications before your next appointment, please call your pharmacy*  Lab Work: None ordered If you have labs (blood work) drawn today and your tests are completely normal, you will receive your results only by: Marland Kitchen MyChart Message (if you have MyChart) OR . A paper copy in the mail If you have any lab test that is abnormal or we need to change your treatment, we will call you to review the results.  Testing/Procedures: Your physician has requested that you have an echocardiogram. Echocardiography is a painless test that uses sound waves to create images of your heart. It provides your doctor with information about the size and shape of your heart and how well your heart's chambers and valves are working. This procedure takes approximately one hour. There are no restrictions for this procedure.  Follow-Up: At [facility], you and your health needs are our priority.  As part of our continuing mission to provide you with exceptional heart care, we have created designated Provider Care Teams.  These Care Teams include your primary Cardiologist (physician) and Advanced Practice Providers (APPs -  Physician Assistants and Nurse Practitioners) who all work together to provide you with the care you need, when you need it.  Your next appointment:   5 month(s)  The format for your next appointment:   In Person  Provider:   You will see Gail Olson.  Or, you can be scheduled with the following Advanced Practice Provider on your designated Care Team (at our [facility]):  Gail Montana, FNP

## 2019-02-15 ENCOUNTER — Ambulatory Visit (HOSPITAL_BASED_OUTPATIENT_CLINIC_OR_DEPARTMENT_OTHER)
Admission: RE | Admit: 2019-02-15 | Discharge: 2019-02-15 | Disposition: A | Discharge status: Home / Self Care | Payer: PPO | Source: Ambulatory Visit | Attending: Cardiology | Admitting: Cardiology

## 2019-02-15 ENCOUNTER — Other Ambulatory Visit: Payer: Self-pay

## 2019-02-15 DIAGNOSIS — R0600 Dyspnea, unspecified: Secondary | ICD-10-CM | POA: Diagnosis not present

## 2019-02-15 DIAGNOSIS — I2721 Secondary pulmonary arterial hypertension: Secondary | ICD-10-CM | POA: Diagnosis not present

## 2019-02-15 DIAGNOSIS — R0789 Other chest pain: Secondary | ICD-10-CM | POA: Diagnosis not present

## 2019-02-15 DIAGNOSIS — R0609 Other forms of dyspnea: Secondary | ICD-10-CM

## 2019-02-15 NOTE — Progress Notes (Signed)
  Echocardiogram 2D Echocardiogram has been performed.  Gail Olson 02/15/2019, 12:00 PM

## 2019-02-17 ENCOUNTER — Ambulatory Visit: Payer: PPO | Admitting: Cardiology

## 2019-03-16 DIAGNOSIS — I872 Venous insufficiency (chronic) (peripheral): Secondary | ICD-10-CM | POA: Diagnosis not present

## 2019-03-16 DIAGNOSIS — I272 Pulmonary hypertension, unspecified: Secondary | ICD-10-CM | POA: Diagnosis not present

## 2019-03-16 DIAGNOSIS — D6859 Other primary thrombophilia: Secondary | ICD-10-CM

## 2019-03-16 DIAGNOSIS — I82512 Chronic embolism and thrombosis of left femoral vein: Secondary | ICD-10-CM | POA: Diagnosis not present

## 2019-03-16 DIAGNOSIS — J449 Chronic obstructive pulmonary disease, unspecified: Secondary | ICD-10-CM | POA: Diagnosis not present

## 2019-03-16 DIAGNOSIS — Z7901 Long term (current) use of anticoagulants: Secondary | ICD-10-CM | POA: Diagnosis not present

## 2019-03-16 DIAGNOSIS — I825Y2 Chronic embolism and thrombosis of unspecified deep veins of left proximal lower extremity: Secondary | ICD-10-CM

## 2019-03-16 DIAGNOSIS — G8929 Other chronic pain: Secondary | ICD-10-CM | POA: Diagnosis not present

## 2019-03-16 DIAGNOSIS — Z86711 Personal history of pulmonary embolism: Secondary | ICD-10-CM | POA: Diagnosis not present

## 2019-03-16 HISTORY — DX: Other primary thrombophilia: D68.59

## 2019-03-16 HISTORY — DX: Chronic embolism and thrombosis of unspecified deep veins of left proximal lower extremity: I82.5Y2

## 2019-03-17 DIAGNOSIS — D6859 Other primary thrombophilia: Secondary | ICD-10-CM | POA: Diagnosis not present

## 2019-04-05 DIAGNOSIS — Z6841 Body Mass Index (BMI) 40.0 and over, adult: Secondary | ICD-10-CM | POA: Diagnosis not present

## 2019-04-05 DIAGNOSIS — I10 Essential (primary) hypertension: Secondary | ICD-10-CM | POA: Diagnosis not present

## 2019-04-05 DIAGNOSIS — E559 Vitamin D deficiency, unspecified: Secondary | ICD-10-CM | POA: Diagnosis not present

## 2019-04-05 DIAGNOSIS — I825Y2 Chronic embolism and thrombosis of unspecified deep veins of left proximal lower extremity: Secondary | ICD-10-CM | POA: Diagnosis not present

## 2019-04-05 DIAGNOSIS — E039 Hypothyroidism, unspecified: Secondary | ICD-10-CM | POA: Diagnosis not present

## 2019-04-05 DIAGNOSIS — E538 Deficiency of other specified B group vitamins: Secondary | ICD-10-CM | POA: Diagnosis not present

## 2019-04-05 DIAGNOSIS — R7301 Impaired fasting glucose: Secondary | ICD-10-CM | POA: Diagnosis not present

## 2019-04-08 ENCOUNTER — Other Ambulatory Visit: Payer: Self-pay | Admitting: Cardiology

## 2019-05-04 DIAGNOSIS — Z7901 Long term (current) use of anticoagulants: Secondary | ICD-10-CM | POA: Diagnosis not present

## 2019-05-04 DIAGNOSIS — I872 Venous insufficiency (chronic) (peripheral): Secondary | ICD-10-CM | POA: Diagnosis not present

## 2019-05-04 DIAGNOSIS — I825Y2 Chronic embolism and thrombosis of unspecified deep veins of left proximal lower extremity: Secondary | ICD-10-CM | POA: Diagnosis not present

## 2019-05-04 DIAGNOSIS — J449 Chronic obstructive pulmonary disease, unspecified: Secondary | ICD-10-CM | POA: Diagnosis not present

## 2019-06-10 DIAGNOSIS — J449 Chronic obstructive pulmonary disease, unspecified: Secondary | ICD-10-CM | POA: Diagnosis not present

## 2019-06-10 DIAGNOSIS — J0100 Acute maxillary sinusitis, unspecified: Secondary | ICD-10-CM | POA: Diagnosis not present

## 2019-06-10 DIAGNOSIS — J40 Bronchitis, not specified as acute or chronic: Secondary | ICD-10-CM | POA: Diagnosis not present

## 2019-06-10 DIAGNOSIS — J4540 Moderate persistent asthma, uncomplicated: Secondary | ICD-10-CM | POA: Diagnosis not present

## 2019-06-10 DIAGNOSIS — R05 Cough: Secondary | ICD-10-CM | POA: Diagnosis not present

## 2019-07-26 DIAGNOSIS — G4733 Obstructive sleep apnea (adult) (pediatric): Secondary | ICD-10-CM | POA: Diagnosis not present

## 2019-08-23 DIAGNOSIS — M722 Plantar fascial fibromatosis: Secondary | ICD-10-CM | POA: Diagnosis not present

## 2019-08-23 DIAGNOSIS — J0100 Acute maxillary sinusitis, unspecified: Secondary | ICD-10-CM | POA: Diagnosis not present

## 2019-08-24 ENCOUNTER — Telehealth: Payer: Self-pay | Admitting: Cardiology

## 2019-08-24 NOTE — Telephone Encounter (Signed)
Patient states she is super weak and just not feeling right.  She would like to speak to the nurse.

## 2019-08-24 NOTE — Telephone Encounter (Signed)
Called patient. She reported that yesterday she went to urgent care for a sinus infection she was not tested for covid. She was sent home with prednisone and antibiotics. When she got home she became very weak she couldn't even get out of the car her husband had to help her. Today she is a little better but still weak. She does not have a fever but does report being more cold today than normal. Dr. Bing Matter advised her to get a appointment with pcp patient reports she can't because there is no appointment available with her. Patient is going to go back to urgent care and get tested for covid. No further questions.

## 2019-08-25 ENCOUNTER — Emergency Department (HOSPITAL_BASED_OUTPATIENT_CLINIC_OR_DEPARTMENT_OTHER)
Admission: EM | Admit: 2019-08-25 | Discharge: 2019-08-25 | Disposition: A | Discharge status: Home / Self Care | Payer: PPO | Attending: Emergency Medicine | Admitting: Emergency Medicine

## 2019-08-25 ENCOUNTER — Other Ambulatory Visit: Payer: Self-pay

## 2019-08-25 ENCOUNTER — Encounter (HOSPITAL_BASED_OUTPATIENT_CLINIC_OR_DEPARTMENT_OTHER): Payer: Self-pay | Admitting: Emergency Medicine

## 2019-08-25 DIAGNOSIS — Z79899 Other long term (current) drug therapy: Secondary | ICD-10-CM | POA: Insufficient documentation

## 2019-08-25 DIAGNOSIS — J0101 Acute recurrent maxillary sinusitis: Secondary | ICD-10-CM | POA: Diagnosis not present

## 2019-08-25 DIAGNOSIS — R531 Weakness: Secondary | ICD-10-CM | POA: Insufficient documentation

## 2019-08-25 DIAGNOSIS — I10 Essential (primary) hypertension: Secondary | ICD-10-CM | POA: Diagnosis not present

## 2019-08-25 LAB — CBC WITH DIFFERENTIAL/PLATELET
Abs Immature Granulocytes: 0.03 K/uL (ref 0.00–0.07)
Basophils Absolute: 0 K/uL (ref 0.0–0.1)
Basophils Relative: 0 %
Eosinophils Absolute: 0 K/uL (ref 0.0–0.5)
Eosinophils Relative: 0 %
HCT: 41.4 % (ref 36.0–46.0)
Hemoglobin: 13.4 g/dL (ref 12.0–15.0)
Immature Granulocytes: 0 %
Lymphocytes Relative: 18 %
Lymphs Abs: 1.6 K/uL (ref 0.7–4.0)
MCH: 29.6 pg (ref 26.0–34.0)
MCHC: 32.4 g/dL (ref 30.0–36.0)
MCV: 91.4 fL (ref 80.0–100.0)
Monocytes Absolute: 1.3 K/uL — ABNORMAL HIGH (ref 0.1–1.0)
Monocytes Relative: 15 %
Neutro Abs: 5.7 K/uL (ref 1.7–7.7)
Neutrophils Relative %: 67 %
Platelets: 232 K/uL (ref 150–400)
RBC: 4.53 MIL/uL (ref 3.87–5.11)
RDW: 13.4 % (ref 11.5–15.5)
WBC: 8.5 K/uL (ref 4.0–10.5)
nRBC: 0 % (ref 0.0–0.2)

## 2019-08-25 LAB — BASIC METABOLIC PANEL WITH GFR
Anion gap: 9 (ref 5–15)
BUN: 12 mg/dL (ref 8–23)
CO2: 25 mmol/L (ref 22–32)
Calcium: 8.7 mg/dL — ABNORMAL LOW (ref 8.9–10.3)
Chloride: 106 mmol/L (ref 98–111)
Creatinine, Ser: 0.77 mg/dL (ref 0.44–1.00)
GFR calc Af Amer: >60 mL/min (ref >60)
GFR calc non Af Amer: >60 mL/min (ref >60)
Glucose, Bld: 150 mg/dL — ABNORMAL HIGH (ref 70–99)
Potassium: 4.1 mmol/L (ref 3.5–5.1)
Sodium: 140 mmol/L (ref 135–145)

## 2019-08-25 LAB — TROPONIN I (HIGH SENSITIVITY): Troponin I (High Sensitivity): 2 ng/L (ref <18)

## 2019-08-25 NOTE — ED Triage Notes (Signed)
Pt seen at urgent care on 08-23-19 for sinus infection. When pt got home that afternoon, she started to have generalized weakness with difficulty ambulating.  Pt states she had to use a wheelchair that evening.  The next morning she felt a little better but still had to use wheelchair to move a distance.  Pt states she went back to urgent care this am, was told to come to ED.  Pt states weakness started prior to taking abx.   No neuro deficits noted.  Pt has some generalized weakness, grips equal.  No facial droop.

## 2019-08-25 NOTE — ED Provider Notes (Signed)
[facility] EMERGENCY DEPARTMENT Provider Note   CSN: [id number] Arrival date & time: 08/25/19  1027     History Chief Complaint  Patient presents with  . Weakness    Gail Olson is a 69 y.o. female.  69 yo F with a chief complaints of generalized fatigue.  Patient states that she had been coming down with a sinus infection over the past week or so and went to urgent care and was prescribed an antibiotic.  When she went home that evening she felt that she was suddenly very weak and unable to stand on her own.  Her husband was able to get her into the house and she has a wheelchair that she uses at home to sit in because she cannot push the chair back away from the table without hurting her back.  She has had multiple back fractures that required operation and she is never gotten back to 100% functionality afterwards usually walks with a cane and does have the walker in case she needs it to get around the house.  She denies any worsening to her back pain she denies chest pain.  Has chronic shortness of breath is unchanged.  She denies abdominal pain nausea vomiting or diarrhea.  She denies any other medication change other than the antibiotic.  She feels that her sinus infection has improved somewhat.  She feels that her weakness is also significantly improved.  She is not back to normal but now feels that she can stand independently.  The history is provided by the patient.  Weakness Severity:  Moderate Onset quality:  Gradual Duration:  2 days Timing:  Constant Progression:  Worsening Chronicity:  New Relieved by:  Nothing Worsened by:  Nothing Associated symptoms: cough   Associated symptoms: no arthralgias, no chest pain, no dizziness, no dysuria, no fever, no headaches, no myalgias, no nausea, no shortness of breath, no urgency and no vomiting        Past Medical History:  Diagnosis Date  . Arthritis   . DVT (deep venous thrombosis) (HCC)   . Pulmonary  embolism (HCC)   . Thyroid disease     Patient Active Problem List   Diagnosis Date Noted  . History of pulmonary embolism 07/30/2018  . Precordial chest pain 10/07/2017  . Pulmonary artery hypertension (HCC) 05/21/2017  . Simple chronic bronchitis (HCC) 05/06/2017  . OSA (obstructive sleep apnea) 05/06/2017  . Chest pain 03/18/2017  . Pulmonary embolism (HCC) 02/26/2017  . Hypothyroidism 02/26/2017  . Atypical chest pain 11/01/2015  . Palpitations 11/01/2015  . Nodule of right lung 10/25/2015  . Dyspnea on exertion 08/01/2015  . Sinus tachycardia 08/01/2015  . Gastroesophageal reflux disease without esophagitis 05/24/2014  . Elevated blood-pressure reading without diagnosis of hypertension 05/24/2014  . Lichen sclerosus et atrophicus 12/16/2013  . Vitamin D deficiency 12/16/2013    Past Surgical History:  Procedure Laterality Date  . APPENDECTOMY    . BACK SURGERY    . CHOLECYSTECTOMY    . OVARY SURGERY       OB History   No obstetric history on file.     Family History  Problem Relation Age of Onset  . Hypertension Mother   . Cancer Mother   . Hypertension Sister   . Hypertension Brother   . Pulmonary disease Neg Hx     Social History   Tobacco Use  . Smoking status: Never Smoker  . Smokeless tobacco: Never Used  Vaping Use  . Vaping Use:  Never used  Substance Use Topics  . Alcohol use: No  . Drug use: No    Home Medications Prior to Admission medications   Medication Sig Start Date End Date Taking? Authorizing Provider  acetaminophen (TYLENOL) 650 MG CR tablet Take 650 mg by mouth every 8 (eight) hours as needed for pain.    [provider]  albuterol (PROAIR HFA) 108 (90 Base) MCG/ACT inhaler Inhale 2 puffs into the lungs See admin instructions. 2 puffs every four to six hours as needed for shortness of breath    [provider]  betamethasone dipropionate (DIPROLENE) 0.05 % ointment Apply 1 application topically 2 (two) times  daily as needed (to affected areas).  01/09/17   [provider]  doxycycline (DORYX) 100 MG EC tablet Take 100 mg by mouth 2 (two) times daily.    [provider]  ELIQUIS 5 MG TABS tablet TAKE 1 TABLET BY MOUTH TWICE A DAY 04/08/19   Georgeanna Lea, MD  fluticasone (FLONASE) 50 MCG/ACT nasal spray PLACE 2 SPRAYS INTO BOTH NOSTRILS AT BEDTIME AS NEEDED FOR ALLERGIES OR RHINITIS. Patient taking differently: Place 2 sprays into both nostrils 2 (two) times daily.  07/31/17   Coralyn Helling, MD  fluticasone (FLOVENT HFA) 110 MCG/ACT inhaler Inhale 2 puffs into the lungs 2 (two) times daily as needed (Cough, wheeze, chest congestion, or shortness of breath). 11/20/17   Coralyn Helling, MD  hydrochlorothiazide (MICROZIDE) 12.5 MG capsule Take 12.5 mg by mouth daily as needed for edema or fluid. 03/12/17   [provider]  HYDROcodone-acetaminophen (NORCO/VICODIN) 5-325 MG tablet Take 1 tablet by mouth every 8 (eight) hours as needed for pain. 03/12/17   [provider]  Lactobacillus (PROBIOTIC ACIDOPHILUS PO) Take 1 capsule by mouth daily.    [provider]  levothyroxine (SYNTHROID, LEVOTHROID) 50 MCG tablet Take 50 mcg by mouth daily.  10/06/16   [provider]  loratadine (CLARITIN) 10 MG tablet Take 1 tablet (10 mg total) by mouth daily. Patient taking differently: Take 10 mg by mouth 2 (two) times daily.  06/10/17   Bevelyn Ngo, NP  Magnesium 250 MG TABS Take 2 tablets by mouth daily.     [provider]  methocarbamol (ROBAXIN) 750 MG tablet Take 750 mg by mouth 3 (three) times daily as needed for muscle spasms.  06/27/16   [provider]  Metoprolol Succinate 50 MG CS24 Take 1 tablet by mouth daily.    [provider]  montelukast (SINGULAIR) 10 MG tablet TAKE 1 TABLET BY MOUTH EVERYDAY AT BEDTIME 05/07/18   Coralyn Helling, MD  omeprazole (PRILOSEC) 20 MG capsule Take 20 mg by mouth daily as needed (for reflux).  02/06/17    [provider]  OVER THE COUNTER MEDICATION Remefemin tablets: Take 1 tablet by mouth two to three times a week    [provider]  Spacer/Aero-Holding Chambers (AEROCHAMBER MV) inhaler Use as instructed 09/23/17   Coralyn Helling, MD  UNABLE TO FIND Compression stockings, daily 02/28/17   Leatha Gilding, MD  vitamin B-12 (CYANOCOBALAMIN) 1000 MCG tablet Take 1,000 mcg by mouth 2 (two) times daily.     [provider]  Vitamin D, Ergocalciferol, (DRISDOL) 50000 units CAPS capsule Take 50,000 Units by mouth once a week. 06/29/17   [provider]    Allergies    Nsaids, Other, Prednisone, Risedronate, Adhesive [tape], Codeine, and Penicillins  Review of Systems   Review of Systems  Constitutional: Negative for  chills and fever.  HENT: Positive for congestion and sinus pressure. Negative for rhinorrhea.   Eyes: Negative for redness and visual disturbance.  Respiratory: Positive for cough. Negative for shortness of breath and wheezing.   Cardiovascular: Negative for chest pain and palpitations.  Gastrointestinal: Negative for nausea and vomiting.  Genitourinary: Negative for dysuria and urgency.  Musculoskeletal: Negative for arthralgias and myalgias.  Skin: Negative for pallor and wound.  Neurological: Positive for weakness. Negative for dizziness and headaches.    Physical Exam Updated Vital Signs BP 139/88   Pulse 97   Temp 98.9 F (37.2 C) (Oral)   Resp 16   Ht 5\' 6"  (1.676 m)   Wt 115.2 kg   SpO2 98%   BMI 41.00 kg/m   Physical Exam Vitals and nursing note reviewed.  Constitutional:      General: She is not in acute distress.    Appearance: She is well-developed. She is not diaphoretic.  HENT:     Head: Normocephalic and atraumatic.     Comments: Swollen turbinates, posterior nasal drip, no noted sinus ttp, tm normal bilaterally.   Eyes:     Pupils: Pupils are equal, round, and reactive to light.  Cardiovascular:     Rate and  Rhythm: Normal rate and regular rhythm.     Heart sounds: No murmur heard.  No friction rub. No gallop.   Pulmonary:     Effort: Pulmonary effort is normal.     Breath sounds: No wheezing or rales.  Abdominal:     General: There is no distension.     Palpations: Abdomen is soft.     Tenderness: There is no abdominal tenderness.  Musculoskeletal:        General: No tenderness.     Cervical back: Normal range of motion and neck supple.     Comments: There is no clonus to the lower extremities.  Her reflexes are 2+ and equal bilaterally.  Strength is equal bilaterally.  Skin:    General: Skin is warm and dry.  Neurological:     Mental Status: She is alert and oriented to person, place, and time.  Psychiatric:        Behavior: Behavior normal.     ED Results / Procedures / Treatments   Labs (all labs ordered are listed, but only abnormal results are displayed) Labs Reviewed  CBC WITH DIFFERENTIAL/PLATELET - Abnormal; Notable for the following components:      Result Value   Monocytes Absolute 1.3 (*)    All other components within normal limits  BASIC METABOLIC PANEL - Abnormal; Notable for the following components:   Glucose, Bld 150 (*)    Calcium 8.7 (*)    All other components within normal limits  TROPONIN I (HIGH SENSITIVITY)    EKG EKG Interpretation  Date/Time:  Thursday August 25 2019 11:40:03 EDT Ventricular Rate:  88 PR Interval:    QRS Duration: 87 QT Interval:  367 QTC Calculation: 444 R Axis:   -34 Text Interpretation: Sinus rhythm Left axis deviation Abnormal R-wave progression, early transition No significant change since last tracing Confirmed by Deno Etienne [phone]) on 08/25/2019 11:53:42 AM   Radiology No results found.  Procedures Procedures (including critical care time)  Medications Ordered in ED Medications - No data to display  ED Course  I have reviewed the triage vital signs and the nursing notes.  Pertinent labs & imaging results that  were available during my care of the patient were reviewed by me  and considered in my medical decision making (see chart for details).    MDM Rules/Calculators/A&P                          69 yo F with a chief complaints of weakness.  Patient went to urgent care couple days ago and was diagnosed with a sinus infection and that evening she was too weak to stand.  This is actually improved over the course the past couple days.  She has no focal deficit for me.  I wonder if this is just deconditioning due to her infection and her chronic gait instability from her prior back surgeries.  This seems less likely to be Guillain-Barr or Lambert-Eaton syndrome as she has intact reflexes and her symptoms are actually improving.  We will obtain a laboratory evaluation to assess for other causes of weakness.  As she is improving I suspect that she will continue to and we will have her follow-up with her family doctor.  The patient is not anemic.  She has no significant electrolyte abnormality.  No leukocytosis.  Her EKG is unremarkable and her troponin is negative.  At this point I will discharge the patient home.  Have her follow-up with her family doctor.  12:16 PM:  I have discussed the diagnosis/risks/treatment options with the patient and family and believe the pt to be eligible for discharge home to follow-up with PCP. We also discussed returning to the ED immediately if new or worsening sx occur. We discussed the sx which are most concerning (e.g., sudden worsening pain, fever, inability to tolerate by mouth) that necessitate immediate return. Medications administered to the patient during their visit and any new prescriptions provided to the patient are listed below.  Medications given during this visit Medications - No data to display   The patient appears reasonably screen and/or stabilized for discharge and I doubt any other medical condition or other [facility] requiring further screening, evaluation, or  treatment in the ED at this time prior to discharge.   Final Clinical Impression(s) / ED Diagnoses Final diagnoses:  Generalized weakness  Acute recurrent maxillary sinusitis    Rx / DC Orders ED Discharge Orders    None       Melene Plan, DO 08/25/19 1216

## 2019-08-25 NOTE — ED Notes (Signed)
ED Provider at bedside. 

## 2019-08-25 NOTE — Discharge Instructions (Signed)
Please return to the ED for worsening weakness.  Follow-up with your family doctor.

## 2019-09-29 ENCOUNTER — Other Ambulatory Visit: Payer: Self-pay | Admitting: Cardiology

## 2019-10-04 DIAGNOSIS — Z1231 Encounter for screening mammogram for malignant neoplasm of breast: Secondary | ICD-10-CM | POA: Diagnosis not present

## 2019-10-05 DIAGNOSIS — E039 Hypothyroidism, unspecified: Secondary | ICD-10-CM | POA: Diagnosis not present

## 2019-10-05 DIAGNOSIS — L820 Inflamed seborrheic keratosis: Secondary | ICD-10-CM | POA: Diagnosis not present

## 2019-10-05 DIAGNOSIS — R7301 Impaired fasting glucose: Secondary | ICD-10-CM | POA: Diagnosis not present

## 2019-10-05 DIAGNOSIS — E538 Deficiency of other specified B group vitamins: Secondary | ICD-10-CM | POA: Diagnosis not present

## 2019-10-05 DIAGNOSIS — R29898 Other symptoms and signs involving the musculoskeletal system: Secondary | ICD-10-CM | POA: Diagnosis not present

## 2019-10-05 DIAGNOSIS — Z6841 Body Mass Index (BMI) 40.0 and over, adult: Secondary | ICD-10-CM | POA: Diagnosis not present

## 2019-10-05 DIAGNOSIS — I10 Essential (primary) hypertension: Secondary | ICD-10-CM | POA: Diagnosis not present

## 2019-10-05 DIAGNOSIS — Z20822 Contact with and (suspected) exposure to covid-19: Secondary | ICD-10-CM | POA: Diagnosis not present

## 2019-10-05 DIAGNOSIS — Z09 Encounter for follow-up examination after completed treatment for conditions other than malignant neoplasm: Secondary | ICD-10-CM | POA: Diagnosis not present

## 2019-10-05 DIAGNOSIS — E559 Vitamin D deficiency, unspecified: Secondary | ICD-10-CM | POA: Diagnosis not present

## 2019-10-23 ENCOUNTER — Other Ambulatory Visit: Payer: Self-pay | Admitting: Cardiology

## 2019-12-02 ENCOUNTER — Other Ambulatory Visit: Payer: Self-pay

## 2019-12-02 ENCOUNTER — Encounter: Payer: Self-pay | Admitting: Cardiology

## 2019-12-02 ENCOUNTER — Ambulatory Visit (INDEPENDENT_AMBULATORY_CARE_PROVIDER_SITE_OTHER): Payer: PPO | Admitting: Cardiology

## 2019-12-02 VITALS — BP 136/82 | HR 103 | Ht 66.0 in | Wt 256.6 lb

## 2019-12-02 DIAGNOSIS — R0609 Other forms of dyspnea: Secondary | ICD-10-CM

## 2019-12-02 DIAGNOSIS — I2721 Secondary pulmonary arterial hypertension: Secondary | ICD-10-CM

## 2019-12-02 DIAGNOSIS — K219 Gastro-esophageal reflux disease without esophagitis: Secondary | ICD-10-CM

## 2019-12-02 DIAGNOSIS — Z86711 Personal history of pulmonary embolism: Secondary | ICD-10-CM

## 2019-12-02 DIAGNOSIS — R0600 Dyspnea, unspecified: Secondary | ICD-10-CM

## 2019-12-02 NOTE — Progress Notes (Signed)
Cardiology Office Note:    Date:  12/02/2019   ID:  Gail Olson, DOB 11-Jun-1950, MRN [id number]  PCP:  Shellia Cleverly, PA  Cardiologist:  Gypsy Balsam, MD    Referring MD: Shellia Cleverly, [place]   No chief complaint on file. Am not doing well  History of Present Illness:    Gail Olson is a 69 y.o. female with past medical history significant for DVT, PE, she is anticoagulated will continue.  She also got history of essential hypertension obstructive sleep apnea sinus tachycardia.  She presented to my office for regular follow-up she is devastated by the fact that her sister is in our ICU with COVID-19 infection she is intermittent just today she had a stroke.  On top of that she does have multiple family members with a stroke none of them had vaccine.  She described to have more shortness of breath than before some more swelling of lower extremities at evening time.  Described also to have palpitations when she feels her heart speeding up.  Past Medical History:  Diagnosis Date  . Arthritis   . DVT (deep venous thrombosis) (HCC)   . Pulmonary embolism (HCC)   . Thyroid disease     Past Surgical History:  Procedure Laterality Date  . APPENDECTOMY    . BACK SURGERY    . CHOLECYSTECTOMY    . OVARY SURGERY      Current Medications: Current Meds  Medication Sig  . acetaminophen (TYLENOL) 650 MG CR tablet Take 650 mg by mouth every 8 (eight) hours as needed for pain.  Marland Kitchen ascorbic acid (VITAMIN C) 500 MG tablet 500 mg daily.  . betamethasone dipropionate (DIPROLENE) 0.05 % ointment Apply 1 application topically 2 (two) times daily as needed (to affected areas).   Marland Kitchen ELIQUIS 5 MG TABS tablet TAKE 1 TABLET BY MOUTH TWICE A DAY  . fluticasone (FLONASE) 50 MCG/ACT nasal spray PLACE 2 SPRAYS INTO BOTH NOSTRILS AT BEDTIME AS NEEDED FOR ALLERGIES OR RHINITIS. (Patient taking differently: Place 2 sprays into both nostrils 2 (two) times daily. )  . fluticasone (FLOVENT HFA) 110  MCG/ACT inhaler Inhale 2 puffs into the lungs 2 (two) times daily as needed (Cough, wheeze, chest congestion, or shortness of breath).  . hydrochlorothiazide (MICROZIDE) 12.5 MG capsule Take 12.5 mg by mouth daily as needed for edema or fluid.  Marland Kitchen HYDROcodone-acetaminophen (NORCO/VICODIN) 5-325 MG tablet Take 1 tablet by mouth every 8 (eight) hours as needed for pain.  . Lactobacillus (PROBIOTIC ACIDOPHILUS PO) Take 1 capsule by mouth daily.  Marland Kitchen levothyroxine (SYNTHROID, LEVOTHROID) 50 MCG tablet Take 50 mcg by mouth daily.   Marland Kitchen loratadine (CLARITIN) 10 MG tablet Take 1 tablet (10 mg total) by mouth daily. (Patient taking differently: Take 10 mg by mouth 2 (two) times daily. )  . Magnesium Oxide 250 MG TABS 250 mg daily.  . meclizine (ANTIVERT) 25 MG tablet Take 25 mg by mouth 3 (three) times daily as needed.  . methocarbamol (ROBAXIN) 750 MG tablet Take 750 mg by mouth 3 (three) times daily as needed for muscle spasms.   . Metoprolol Succinate 50 MG CS24 Take 1 tablet by mouth daily.  . montelukast (SINGULAIR) 10 MG tablet TAKE 1 TABLET BY MOUTH EVERYDAY AT BEDTIME  . omeprazole (PRILOSEC) 20 MG capsule Take 20 mg by mouth daily as needed (for reflux).   Marland Kitchen OVER THE COUNTER MEDICATION Remefemin tablets: Take 1 tablet by mouth two to three times a week  .  sulfamethoxazole-trimethoprim (BACTRIM DS) 800-160 MG tablet Take 1 tablet by mouth as needed.  Marland Kitchen UNABLE TO FIND Compression stockings, daily  . vitamin B-12 (CYANOCOBALAMIN) 1000 MCG tablet Take 1,000 mcg by mouth 2 (two) times daily.   . Vitamin D, Ergocalciferol, (DRISDOL) 50000 units CAPS capsule Take 50,000 Units by mouth once a week.     Allergies:   Nsaids, Other, Prednisone, Risedronate, Adhesive [tape], Codeine, and Penicillins   Social History   Socioeconomic History  . Marital status: Married    Spouse name: Not on file  . Number of children: Not on file  . Years of education: Not on file  . Highest education level: Not on file    Occupational History  . Not on file  Tobacco Use  . Smoking status: Never Smoker  . Smokeless tobacco: Never Used  Vaping Use  . Vaping Use: Never used  Substance and Sexual Activity  . Alcohol use: No  . Drug use: No  . Sexual activity: Not on file  Other Topics Concern  . Not on file  Social History Narrative  . Not on file   Social Determinants of Health   Financial Resource Strain:   . Difficulty of Paying Living Expenses: Not on file  Food Insecurity:   . Worried About Programme researcher, broadcasting/film/video in the Last Year: Not on file  . Ran Out of Food in the Last Year: Not on file  Transportation Needs:   . Lack of Transportation (Medical): Not on file  . Lack of Transportation (Non-Medical): Not on file  Physical Activity:   . Days of Exercise per Week: Not on file  . Minutes of Exercise per Session: Not on file  Stress:   . Feeling of Stress : Not on file  Social Connections:   . Frequency of Communication with Friends and Family: Not on file  . Frequency of Social Gatherings with Friends and Family: Not on file  . Attends Religious Services: Not on file  . Active Member of Clubs or Organizations: Not on file  . Attends Banker Meetings: Not on file  . Marital Status: Not on file     Family History: The patient's family history includes Cancer in her mother; Hypertension in her brother, mother, and sister. There is no history of Pulmonary disease. ROS:   Please see the history of present illness.    All 14 point review of systems negative except as described per history of present illness  EKGs/Labs/Other Studies Reviewed:      Recent Labs: 08/25/2019: BUN 12; Creatinine, Ser 0.77; Hemoglobin 13.4; Platelets 232; Potassium 4.1; Sodium 140  Recent Lipid Panel No results found for: CHOL, TRIG, HDL, CHOLHDL, VLDL, LDLCALC, LDLDIRECT  Physical Exam:    VS:  BP 136/82 (BP Location: Left Arm, Patient Position: Sitting, Cuff Size: Large)   Pulse (!) 103   Ht  5\' 6"  (1.676 m)   Wt 256 lb 9.6 oz (116.4 kg)   SpO2 97%   BMI 41.42 kg/m     Wt Readings from Last 3 Encounters:  12/02/19 256 lb 9.6 oz (116.4 kg)  08/25/19 254 lb (115.2 kg)  02/09/19 238 lb (108 kg)     GEN:  Well nourished, well developed in no acute distress HEENT: Normal NECK: No JVD; No carotid bruits LYMPHATICS: No lymphadenopathy CARDIAC: RRR, no murmurs, no rubs, no gallops RESPIRATORY:  Clear to auscultation without rales, wheezing or rhonchi  ABDOMEN: Soft, non-tender, non-distended MUSCULOSKELETAL:  No edema; No  deformity  SKIN: Warm and dry LOWER EXTREMITIES: no swelling NEUROLOGIC:  Alert and oriented x 3 PSYCHIATRIC:  Normal affect   ASSESSMENT:    1. Pulmonary artery hypertension (HCC)   2. Gastroesophageal reflux disease without esophagitis   3. History of pulmonary embolism   4. Dyspnea on exertion    PLAN:    In order of problems listed above:  1. Pulmonary hypertension last echocardiogram showed normal pulmonary pressure, however she complained of having some more shortness of breath.  We will ask her to have echocardiogram repeated. 2. History of gastroesophageal reflux disease that seems to be under control. 3. History of pulmonary emboli: Anticoagulated which I will continue. 4. Dyspnea on exertion: Plan as outlined above   Medication Adjustments/Labs and Tests Ordered: Current medicines are reviewed at length with the patient today.  Concerns regarding medicines are outlined above.  No orders of the defined types were placed in this encounter.  Medication changes: No orders of the defined types were placed in this encounter.   Signed, Georgeanna Lea, MD, [facility] 12/02/2019 3:43 PM    [facility]

## 2019-12-02 NOTE — Addendum Note (Signed)
Addended by: Hazle Quant on: 12/02/2019 03:47 PM   Modules accepted: Orders

## 2019-12-02 NOTE — Patient Instructions (Signed)
Medication Instructions:  Your physician recommends that you continue on your current medications as directed. Please refer to the Current Medication list given to you today.  *If you need a refill on your cardiac medications before your next appointment, please call your pharmacy*   Lab Work: None If you have labs (blood work) drawn today and your tests are completely normal, you will receive your results only by: . MyChart Message (if you have MyChart) OR . A paper copy in the mail If you have any lab test that is abnormal or we need to change your treatment, we will call you to review the results.   Testing/Procedures: .Your physician has requested that you have an echocardiogram. Echocardiography is a painless test that uses sound waves to create images of your heart. It provides your doctor with information about the size and shape of your heart and how well your heart's chambers and valves are working. This procedure takes approximately one hour. There are no restrictions for this procedure.     Follow-Up: At [facility], you and your health needs are our priority.  As part of our continuing mission to provide you with exceptional heart care, we have created designated Provider Care Teams.  These Care Teams include your primary Cardiologist (physician) and Advanced Practice Providers (APPs -  Physician Assistants and Nurse Practitioners) who all work together to provide you with the care you need, when you need it.  We recommend signing up for the patient portal called "MyChart".  Sign up information is provided on this After Visit Summary.  MyChart is used to connect with patients for Virtual Visits (Telemedicine).  Patients are able to view lab/test results, encounter notes, upcoming appointments, etc.  Non-urgent messages can be sent to your provider as well.   To learn more about what you can do with MyChart, go to [email or web address].    Your next appointment:   4  month(s)  The format for your next appointment:   In Person  Provider:   Robert Krasowski, MD   Other Instructions   Echocardiogram An echocardiogram is a procedure that uses painless sound waves (ultrasound) to produce an image of the heart. Images from an echocardiogram can provide important information about:  Signs of coronary artery disease (CAD).  Aneurysm detection. An aneurysm is a weak or damaged part of an artery wall that bulges out from the normal force of blood pumping through the body.  Heart size and shape. Changes in the size or shape of the heart can be associated with certain conditions, including heart failure, aneurysm, and CAD.  Heart muscle function.  Heart valve function.  Signs of a past heart attack.  Fluid buildup around the heart.  Thickening of the heart muscle.  A tumor or infectious growth around the heart valves. Tell a health care provider about:  Any allergies you have.  All medicines you are taking, including vitamins, herbs, eye drops, creams, and over-the-counter medicines.  Any blood disorders you have.  Any surgeries you have had.  Any medical conditions you have.  Whether you are pregnant or may be pregnant. What are the risks? Generally, this is a safe procedure. However, problems may occur, including:  Allergic reaction to dye (contrast) that may be used during the procedure. What happens before the procedure? No specific preparation is needed. You may eat and drink normally. What happens during the procedure?   An IV tube may be inserted into one of your veins.  You may receive   contrast through this tube. A contrast is an injection that improves the quality of the pictures from your heart.  A gel will be applied to your chest.  A wand-like tool (transducer) will be moved over your chest. The gel will help to transmit the sound waves from the transducer.  The sound waves will harmlessly bounce off of your heart to  allow the heart images to be captured in real-time motion. The images will be recorded on a computer. The procedure may vary among health care providers and hospitals. What happens after the procedure?  You may return to your normal, everyday life, including diet, activities, and medicines, unless your health care provider tells you not to do that. Summary  An echocardiogram is a procedure that uses painless sound waves (ultrasound) to produce an image of the heart.  Images from an echocardiogram can provide important information about the size and shape of your heart, heart muscle function, heart valve function, and fluid buildup around your heart.  You do not need to do anything to prepare before this procedure. You may eat and drink normally.  After the echocardiogram is completed, you may return to your normal, everyday life, unless your health care provider tells you not to do that. This information is not intended to replace advice given to you by your health care provider. Make sure you discuss any questions you have with your health care provider. Document Revised: 06/10/2018 Document Reviewed: 03/22/2016 Elsevier Patient Education  2020 Elsevier Inc.   

## 2019-12-13 ENCOUNTER — Encounter: Payer: Self-pay | Admitting: Cardiology

## 2019-12-21 ENCOUNTER — Other Ambulatory Visit (HOSPITAL_BASED_OUTPATIENT_CLINIC_OR_DEPARTMENT_OTHER): Payer: PPO

## 2019-12-31 DIAGNOSIS — J209 Acute bronchitis, unspecified: Secondary | ICD-10-CM | POA: Diagnosis not present

## 2020-01-10 DIAGNOSIS — E559 Vitamin D deficiency, unspecified: Secondary | ICD-10-CM | POA: Diagnosis not present

## 2020-01-10 DIAGNOSIS — E538 Deficiency of other specified B group vitamins: Secondary | ICD-10-CM | POA: Diagnosis not present

## 2020-01-10 DIAGNOSIS — I10 Essential (primary) hypertension: Secondary | ICD-10-CM | POA: Diagnosis not present

## 2020-01-10 DIAGNOSIS — I272 Pulmonary hypertension, unspecified: Secondary | ICD-10-CM | POA: Diagnosis not present

## 2020-01-10 DIAGNOSIS — R7301 Impaired fasting glucose: Secondary | ICD-10-CM | POA: Diagnosis not present

## 2020-01-10 DIAGNOSIS — J321 Chronic frontal sinusitis: Secondary | ICD-10-CM | POA: Diagnosis not present

## 2020-01-10 DIAGNOSIS — Z6841 Body Mass Index (BMI) 40.0 and over, adult: Secondary | ICD-10-CM | POA: Diagnosis not present

## 2020-01-10 DIAGNOSIS — M1990 Unspecified osteoarthritis, unspecified site: Secondary | ICD-10-CM | POA: Diagnosis not present

## 2020-01-10 DIAGNOSIS — E039 Hypothyroidism, unspecified: Secondary | ICD-10-CM | POA: Diagnosis not present

## 2020-01-30 ENCOUNTER — Other Ambulatory Visit: Payer: Self-pay

## 2020-01-30 ENCOUNTER — Ambulatory Visit: Payer: PPO | Admitting: Pulmonary Disease

## 2020-01-30 ENCOUNTER — Encounter: Payer: Self-pay | Admitting: Pulmonary Disease

## 2020-01-30 VITALS — BP 128/70 | HR 82 | Temp 97.4°F | Ht 66.0 in | Wt 257.6 lb

## 2020-01-30 DIAGNOSIS — E669 Obesity, unspecified: Secondary | ICD-10-CM | POA: Diagnosis not present

## 2020-01-30 DIAGNOSIS — R0600 Dyspnea, unspecified: Secondary | ICD-10-CM | POA: Diagnosis not present

## 2020-01-30 DIAGNOSIS — J4540 Moderate persistent asthma, uncomplicated: Secondary | ICD-10-CM

## 2020-01-30 DIAGNOSIS — G4733 Obstructive sleep apnea (adult) (pediatric): Secondary | ICD-10-CM | POA: Diagnosis not present

## 2020-01-30 DIAGNOSIS — G4730 Sleep apnea, unspecified: Secondary | ICD-10-CM

## 2020-01-30 DIAGNOSIS — R0609 Other forms of dyspnea: Secondary | ICD-10-CM

## 2020-01-30 MED ORDER — ALBUTEROL SULFATE HFA 108 (90 BASE) MCG/ACT IN AERS: 2.0000 | INHALATION_SPRAY | Freq: Four times a day (QID) | RESPIRATORY_TRACT | 5 refills | Status: AC | PRN

## 2020-01-30 MED ORDER — MONTELUKAST SODIUM 10 MG PO TABS: 10.0000 mg | ORAL_TABLET | Freq: Every day | ORAL | 5 refills | Status: AC

## 2020-01-30 NOTE — Patient Instructions (Addendum)
Use singulair 10 mg nightly  Continue flovent two puffs twice per day, and rinse your mouth after each use  Follow up in 6 months

## 2020-01-30 NOTE — Progress Notes (Signed)
[facility]  Chief Complaint  Patient presents with  . Follow-up    shortness of breath with exertion    Constitutional:  BP 128/70 (BP Location: Left Wrist, Cuff Size: Normal)   Pulse 82   Temp (!) 97.4 F (36.3 C) (Other (Comment)) Comment (Src): wrist  Ht 5\' 6"  (1.676 m)   Wt 257 lb 9.6 oz (116.8 kg)   SpO2 98% Comment: room air  BMI 41.58 kg/m   Past Medical History:  OA, Hypothyroidism  Past Surgical History:  Her  has a past surgical history that includes Appendectomy; Back surgery; Cholecystectomy; and Ovary surgery.  Brief Summary:  Gail Olson is a 69 y.o. female with chronic bronchitis, asthma, obstructive sleep apnea, and PE with DVT.      Subjective:   She has noticed more trouble with her breathing over the past few months.  This started when she had dental infection and then fell out.  She wasn't able to walk for several days.  She hasn't recuperated her stamina since then.  She also has noticed more cough and wheezing.  This was associated with exposure to hay in September and October.  [place]  She has been using flovent.  Hasn't used singulair recently.  Her sister recently passed away from COVID and she has been trying to cope with this.  She uses CPAP nightly.  No issues with mask fit.  Physical Exam:   Appearance - well kempt   ENMT - no sinus tenderness, no oral exudate, no LAN, Mallampati 3 airway, no stridor  Respiratory - equal breath sounds bilaterally, no wheezing or rales  CV - s1s2 regular rate and rhythm, no murmurs  Ext - no clubbing, no edema  Skin - no rashes  Psych - normal mood and affect   Pulmonary testing:    Chest Imaging:   CT angio chest 02/26/17 >> b/l PE with RV:LV ratio 1.2, ATX, small airway disease  CT angio chest 03/19/17 >> decreased size of PE, ATX, small hiatal hernia  Sleep Tests:   ONO with RA 04/27/17 >>test  time 8 hrs 21 min. Basal SpO2 92%, low SpO2 83%. Spent 18 min with SpO2 <88%.  HST 06/11/17 >> AHI 19.5, SaO2 low 81%.  Auto CPAP 12/31/19 to 01/29/20 >> used on 30 of 30 nights with average 7 hrs 53 min.  Average AHI 3.6 with median CPAP 9 and 95 th percentile CPAP 12 cm H2O  Cardiac Tests:   Echo 02/15/19 >> EF 60 to 65%, mild LVH, grade 1 DD  Social History:  She  reports that she has never smoked. She has never used smokeless tobacco. She reports that she does not drink alcohol and does not use drugs.  Family History:  Her family history includes Cancer in her mother; Hypertension in her brother, mother, and sister.     Assessment/Plan:   Chronic bronchitis with allergic asthma. - worse symptoms likely from allergies - continue flonase and flovent - will have her start singulair again - continue claritin - prn proair  Obstructive sleep apnea. - she is compliant with CPAP and reports benefit - she uses [employer] for her DME - continue auto CPAP 5 to 15 cm H2O  Unprovoked pulmonary embolism with Lt leg DVT. - continue eliquis through Dr. 02/17/19  Dyspnea on exertion. - much of this is likely related to obesity and deconditioning - encouraged her to maintain a regular  exercise and weight loss regimen   Time Spent Involved in Patient Care on Day of Examination:  33 minutes  Follow up:  Patient Instructions  Use singulair 10 mg nightly  Continue flovent two puffs twice per day, and rinse your mouth after each use  Follow up in 6 months   Medication List:   Allergies as of 01/30/2020      Reactions   Nsaids Other (See Comments)   Not suppose to take with her blood thinner   Other Other (See Comments)   Extreme stomach pain if any of these are eaten: spicy foods, onions, fried foods, cauliflower, and broccoli   Prednisone Other (See Comments)   Not suppose to take due to advanced osteoporosis   Risedronate Other (See Comments)   (Actonel) Caused EXTREME  JAW PAIN and muscles and joints ached   Adhesive [tape] Rash, Other (See Comments)   EKG pads left welts on the skin   Codeine Palpitations, Other (See Comments)   Tachycardia   Penicillins Rash   Has patient had a PCN reaction causing immediate rash, facial/tongue/throat swelling, SOB or lightheadedness with hypotension: Yes Has patient had a PCN reaction causing severe rash involving mucus membranes or skin necrosis: Unk Has patient had a PCN reaction that required hospitalization: Unk Has patient had a PCN reaction occurring within the last 10 years: Unk If all of the above answers are "NO", then may proceed with Cephalosporin use.      Medication List       Accurate as of January 30, 2020  5:19 PM. If you have any questions, ask your nurse or doctor.        STOP taking these medications   doxycycline 100 MG EC tablet Commonly known as: DORYX Stopped by: Coralyn Helling, MD     TAKE these medications   acetaminophen 650 MG CR tablet Commonly known as: TYLENOL Take 650 mg by mouth every 8 (eight) hours as needed for pain.   albuterol 108 (90 Base) MCG/ACT inhaler Commonly known as: ProAir HFA Inhale 2 puffs into the lungs every 6 (six) hours as needed for wheezing or shortness of breath. Started by: Coralyn Helling, MD   ascorbic acid 500 MG tablet Commonly known as: VITAMIN C 500 mg daily.   betamethasone dipropionate 0.05 % ointment Commonly known as: DIPROLENE Apply 1 application topically 2 (two) times daily as needed (to affected areas).   Eliquis 5 MG Tabs tablet Generic drug: apixaban TAKE 1 TABLET BY MOUTH TWICE A DAY   fluticasone 110 MCG/ACT inhaler Commonly known as: Flovent HFA Inhale 2 puffs into the lungs 2 (two) times daily as needed (Cough, wheeze, chest congestion, or shortness of breath).   fluticasone 50 MCG/ACT nasal spray Commonly known as: FLONASE PLACE 2 SPRAYS INTO BOTH NOSTRILS AT BEDTIME AS NEEDED FOR ALLERGIES OR RHINITIS. What changed:  when to take this   hydrochlorothiazide 12.5 MG capsule Commonly known as: MICROZIDE Take 12.5 mg by mouth daily as needed for edema or fluid.   HYDROcodone-acetaminophen 5-325 MG tablet Commonly known as: NORCO/VICODIN Take 1 tablet by mouth every 8 (eight) hours as needed for pain.   levothyroxine 50 MCG tablet Commonly known as: SYNTHROID Take 50 mcg by mouth daily.   loratadine 10 MG tablet Commonly known as: CLARITIN Take 1 tablet (10 mg total) by mouth daily. What changed: when to take this   Magnesium Oxide 250 MG Tabs 250 mg daily.   meclizine 25 MG tablet Commonly known as: ANTIVERT  Take 25 mg by mouth 3 (three) times daily as needed.   methocarbamol 750 MG tablet Commonly known as: ROBAXIN Take 750 mg by mouth 3 (three) times daily as needed for muscle spasms.   Metoprolol Succinate 50 MG Cs24 Take 1 tablet by mouth daily.   montelukast 10 MG tablet Commonly known as: SINGULAIR Take 1 tablet (10 mg total) by mouth at bedtime. What changed: See the new instructions. Changed by: Coralyn Helling, MD   omeprazole 20 MG capsule Commonly known as: PRILOSEC Take 20 mg by mouth daily as needed (for reflux).   OVER THE COUNTER MEDICATION Remefemin tablets: Take 1 tablet by mouth two to three times a week   PROBIOTIC ACIDOPHILUS PO Take 1 capsule by mouth daily.   sulfamethoxazole-trimethoprim 800-160 MG tablet Commonly known as: BACTRIM DS Take 1 tablet by mouth as needed.   UNABLE TO FIND Compression stockings, daily   vitamin B-12 1000 MCG tablet Commonly known as: CYANOCOBALAMIN Take 1,000 mcg by mouth 2 (two) times daily.   Vitamin D (Ergocalciferol) 1.25 MG (50000 UNIT) Caps capsule Commonly known as: DRISDOL Take 50,000 Units by mouth once a week.       Signature:  Coralyn Helling, MD [facility] Pager - [phone] 01/30/2020, 5:19 PM

## 2020-03-12 ENCOUNTER — Other Ambulatory Visit: Payer: Self-pay

## 2020-03-12 ENCOUNTER — Ambulatory Visit (HOSPITAL_BASED_OUTPATIENT_CLINIC_OR_DEPARTMENT_OTHER)
Admission: RE | Admit: 2020-03-12 | Discharge: 2020-03-12 | Disposition: A | Discharge status: Home / Self Care | Payer: PPO | Source: Ambulatory Visit | Attending: Cardiology | Admitting: Cardiology

## 2020-03-12 DIAGNOSIS — R0600 Dyspnea, unspecified: Secondary | ICD-10-CM | POA: Diagnosis not present

## 2020-03-12 DIAGNOSIS — R0609 Other forms of dyspnea: Secondary | ICD-10-CM

## 2020-03-13 DIAGNOSIS — Z01419 Encounter for gynecological examination (general) (routine) without abnormal findings: Secondary | ICD-10-CM | POA: Diagnosis not present

## 2020-03-13 LAB — ECHOCARDIOGRAM COMPLETE
Area-P 1/2: 3.74 cm2
S' Lateral: 2.58 cm

## 2020-04-03 ENCOUNTER — Other Ambulatory Visit: Payer: Self-pay

## 2020-04-03 DIAGNOSIS — M1990 Unspecified osteoarthritis, unspecified site: Secondary | ICD-10-CM | POA: Insufficient documentation

## 2020-04-03 DIAGNOSIS — I82409 Acute embolism and thrombosis of unspecified deep veins of unspecified lower extremity: Secondary | ICD-10-CM | POA: Insufficient documentation

## 2020-04-03 DIAGNOSIS — E079 Disorder of thyroid, unspecified: Secondary | ICD-10-CM | POA: Insufficient documentation

## 2020-04-04 ENCOUNTER — Ambulatory Visit: Payer: PPO | Admitting: Cardiology

## 2020-04-05 ENCOUNTER — Other Ambulatory Visit: Payer: Self-pay

## 2020-04-05 ENCOUNTER — Ambulatory Visit: Payer: PPO | Admitting: Cardiology

## 2020-04-05 ENCOUNTER — Encounter: Payer: Self-pay | Admitting: Cardiology

## 2020-04-05 VITALS — BP 128/80 | HR 54 | Ht 66.0 in | Wt 265.0 lb

## 2020-04-05 DIAGNOSIS — R0600 Dyspnea, unspecified: Secondary | ICD-10-CM

## 2020-04-05 DIAGNOSIS — I825Y2 Chronic embolism and thrombosis of unspecified deep veins of left proximal lower extremity: Secondary | ICD-10-CM

## 2020-04-05 DIAGNOSIS — Z86711 Personal history of pulmonary embolism: Secondary | ICD-10-CM

## 2020-04-05 DIAGNOSIS — R0609 Other forms of dyspnea: Secondary | ICD-10-CM

## 2020-04-05 DIAGNOSIS — R0789 Other chest pain: Secondary | ICD-10-CM | POA: Diagnosis not present

## 2020-04-05 DIAGNOSIS — J410 Simple chronic bronchitis: Secondary | ICD-10-CM

## 2020-04-05 NOTE — Patient Instructions (Signed)

## 2020-04-05 NOTE — Progress Notes (Signed)
Cardiology Office Note:    Date:  04/05/2020   ID:  Gail Olson, DOB Mar 31, 1950, MRN [id number]  PCP:  Shellia Cleverly, PA  Cardiologist:  Gypsy Balsam, MD    Referring MD: Shellia Cleverly, [place]   Chief Complaint  Patient presents with  . Follow-up  . Medication Refill    Eliquis      History of Present Illness:    Gail Olson is a 70 y.o. female with past medical history significant for DVT, PE, she is anticoagulated, essential hypertension, obstructive sleep apnea, sinus tachycardia.  She presented to my office today to discuss results of her tests.  She does have a lot of stressful situation in the family her sister was very sick eventually died and there is a big fight between family members regarding some dividing of things that she is on.  She is very stressed out about that and she actually talked majority of time visit about that.  Denies having any shortness of breath chest pain tightness squeezing pressure burning chest described to have palpitations only when she gets upset.  Otherwise seems to be doing fine  Past Medical History:  Diagnosis Date  . Age-related osteoporosis without current pathological fracture 12/16/2013   Formatting of this note might be different from the original. Overview:  Due to history of fracture with the background low bone mass (osteopenia)  . Arthritis   . Atypical chest pain 11/01/2015  . Chest pain 03/18/2017  . Chronic deep vein thrombosis (DVT) of proximal vein of left lower extremity (HCC) 03/16/2019  . DVT (deep venous thrombosis) (HCC)   . Dyspnea on exertion 08/01/2015  . Elevated blood-pressure reading without diagnosis of hypertension 05/24/2014   Overview:  IMOUPDATE  . Gastroesophageal reflux disease without esophagitis 05/24/2014   Overview:  Stable on Prilosec. Overview:  Overview:  Stable on Prilosec.  Formatting of this note might be different from the original. Stable on Prilosec. Formatting of this note might be  different from the original. Overview:  Stable on Prilosec.  Overview:  Overview:  Stable on Prilosec. Overview:  Overview:  Stable on Prilosec.  Marland Kitchen History of pulmonary embolism 07/30/2018  . Hypothyroidism 02/26/2017  . Lichen sclerosus et atrophicus 12/16/2013  . Nodule of right lung 10/25/2015   Overview:  Per cxr done at [facility] on Aug 24/2017  RUL  Overview:  Overview:  Per cxr done at [facility] on Aug 24/2017  RUL   Formatting of this note might be different from the original. Per cxr done at [facility] on Aug 24/2017  RUL Formatting of this note might be different from the original. Overview:  Per cxr done at [facility] on Aug 24/2017  RUL   Overview:  Overview:  Per cxr done at [facility] on Aug 24/2017  RUL  Overview:  . Obesity (BMI 30-39.9) 04/20/2018  . Palpitations 11/01/2015  . Precordial chest pain 10/07/2017  . Primary hypercoagulable state (HCC) 03/16/2019  . Pulmonary artery hypertension (HCC) 05/21/2017  . Pulmonary embolism (HCC)   . Simple chronic bronchitis (HCC) 05/06/2017  . Sinus tachycardia 08/01/2015  . Thyroid disease   . Vitamin D deficiency 12/16/2013    Past Surgical History:  Procedure Laterality Date  . APPENDECTOMY    . BACK SURGERY    . CHOLECYSTECTOMY    . OVARY SURGERY      Current Medications: Current Meds  Medication Sig  . acetaminophen (TYLENOL) 650 MG CR tablet Take 650 mg by mouth every 8 (eight) hours  as needed for pain.  Marland Kitchen albuterol (PROAIR HFA) 108 (90 Base) MCG/ACT inhaler Inhale 2 puffs into the lungs every 6 (six) hours as needed for wheezing or shortness of breath.  Marland Kitchen ascorbic acid (VITAMIN C) 500 MG tablet 500 mg daily.  . betamethasone dipropionate (DIPROLENE) 0.05 % ointment Apply 1 application topically 2 (two) times daily as needed (to affected areas).   . clotrimazole-betamethasone (LOTRISONE) cream Apply 1 application topically in the morning and at bedtime.  Marland Kitchen ELIQUIS 5 MG TABS tablet TAKE 1 TABLET BY MOUTH TWICE A DAY  . fluticasone (FLONASE) 50 MCG/ACT nasal spray  PLACE 2 SPRAYS INTO BOTH NOSTRILS AT BEDTIME AS NEEDED FOR ALLERGIES OR RHINITIS. (Patient taking differently: Place 2 sprays into both nostrils 2 (two) times daily.)  . fluticasone (FLOVENT HFA) 110 MCG/ACT inhaler Inhale 2 puffs into the lungs 2 (two) times daily as needed (Cough, wheeze, chest congestion, or shortness of breath).  . hydrochlorothiazide (MICROZIDE) 12.5 MG capsule Take 12.5 mg by mouth daily as needed for edema or fluid.  Marland Kitchen HYDROcodone-acetaminophen (NORCO/VICODIN) 5-325 MG tablet Take 1 tablet by mouth every 8 (eight) hours as needed for pain.  . Lactobacillus (PROBIOTIC ACIDOPHILUS PO) Take 1 capsule by mouth daily.  Marland Kitchen levothyroxine (SYNTHROID, LEVOTHROID) 50 MCG tablet Take 50 mcg by mouth daily.   Marland Kitchen loratadine (CLARITIN) 10 MG tablet Take 1 tablet (10 mg total) by mouth daily. (Patient taking differently: Take 10 mg by mouth 2 (two) times daily.)  . Magnesium Oxide 250 MG TABS 250 mg daily.  . meclizine (ANTIVERT) 25 MG tablet Take 25 mg by mouth 3 (three) times daily as needed.  . methocarbamol (ROBAXIN) 750 MG tablet Take 750 mg by mouth 3 (three) times daily as needed for muscle spasms.   . Metoprolol Succinate 50 MG CS24 Take 1 tablet by mouth daily.  . montelukast (SINGULAIR) 10 MG tablet Take 1 tablet (10 mg total) by mouth at bedtime.  Marland Kitchen omeprazole (PRILOSEC) 20 MG capsule Take 20 mg by mouth daily as needed (for reflux).   Marland Kitchen OVER THE COUNTER MEDICATION Remefemin tablets: Take 1 tablet by mouth two to three times a week  . sulfamethoxazole-trimethoprim (BACTRIM DS) 800-160 MG tablet Take 1 tablet by mouth as needed.  Marland Kitchen UNABLE TO FIND Compression stockings, daily  . vitamin B-12 (CYANOCOBALAMIN) 1000 MCG tablet Take 1,000 mcg by mouth 2 (two) times daily.   . Vitamin D, Ergocalciferol, (DRISDOL) 50000 units CAPS capsule Take 50,000 Units by mouth once a week.     Allergies:   Nsaids, Other, Prednisone, Risedronate, Adhesive [tape], Codeine, and Penicillins   Social  History   Socioeconomic History  . Marital status: Married    Spouse name: Not on file  . Number of children: Not on file  . Years of education: Not on file  . Highest education level: Not on file  Occupational History  . Not on file  Tobacco Use  . Smoking status: Never Smoker  . Smokeless tobacco: Never Used  Vaping Use  . Vaping Use: Never used  Substance and Sexual Activity  . Alcohol use: No  . Drug use: No  . Sexual activity: Not on file  Other Topics Concern  . Not on file  Social History Narrative  . Not on file   Social Determinants of Health   Financial Resource Strain: Not on file  Food Insecurity: Not on file  Transportation Needs: Not on file  Physical Activity: Not on file  Stress: Not on  file  Social Connections: Not on file     Family History: The patient's family history includes Cancer in her mother; Hypertension in her brother, mother, and sister. There is no history of Pulmonary disease. ROS:   Please see the history of present illness.    All 14 point review of systems negative except as described per history of present illness  EKGs/Labs/Other Studies Reviewed:      Recent Labs: 08/25/2019: BUN 12; Creatinine, Ser 0.77; Hemoglobin 13.4; Platelets 232; Potassium 4.1; Sodium 140  Recent Lipid Panel No results found for: CHOL, TRIG, HDL, CHOLHDL, VLDL, LDLCALC, LDLDIRECT  Physical Exam:    VS:  BP 128/80 (BP Location: Left Arm, Patient Position: Sitting)   Pulse (!) 54   Ht 5\' 6"  (1.676 m)   Wt 265 lb (120.2 kg)   SpO2 97%   BMI 42.77 kg/m     Wt Readings from Last 3 Encounters:  04/05/20 265 lb (120.2 kg)  01/30/20 257 lb 9.6 oz (116.8 kg)  12/02/19 256 lb 9.6 oz (116.4 kg)     GEN:  Well nourished, well developed in no acute distress HEENT: Normal NECK: No JVD; No carotid bruits LYMPHATICS: No lymphadenopathy CARDIAC: RRR, no murmurs, no rubs, no gallops RESPIRATORY:  Clear to auscultation without rales, wheezing or rhonchi   ABDOMEN: Soft, non-tender, non-distended MUSCULOSKELETAL:  No edema; No deformity  SKIN: Warm and dry LOWER EXTREMITIES: no swelling NEUROLOGIC:  Alert and oriented x 3 PSYCHIATRIC:  Normal affect   ASSESSMENT:    1. Atypical chest pain   2. Chronic deep vein thrombosis (DVT) of proximal vein of left lower extremity (HCC)   3. Simple chronic bronchitis (HCC)   4. History of pulmonary embolism   5. Dyspnea on exertion    PLAN:    In order of problems listed above:  1. Atypical chest pain denies having any. 2. History of DVT and PE she is anticoagulated which will continue. 3. History of pulmonary hypertension but this is second echocardiogram showed normal pulmonary pressure with normal right ventricle size and function. 4. Dyspnea on exertion improved significantly. 5. Overall prolonged visit and we had a lot of discussion about her family affairs.   Medication Adjustments/Labs and Tests Ordered: Current medicines are reviewed at length with the patient today.  Concerns regarding medicines are outlined above.  Orders Placed This Encounter  Procedures  . EKG 12-Lead   Medication changes: No orders of the defined types were placed in this encounter.   Signed, 02/01/20, MD, [facility] 04/05/2020 4:19 PM    [facility]

## 2020-04-26 ENCOUNTER — Other Ambulatory Visit: Payer: Self-pay | Admitting: Cardiology

## 2020-04-30 DIAGNOSIS — J0100 Acute maxillary sinusitis, unspecified: Secondary | ICD-10-CM | POA: Diagnosis not present

## 2020-04-30 DIAGNOSIS — M79645 Pain in left finger(s): Secondary | ICD-10-CM | POA: Diagnosis not present

## 2020-04-30 DIAGNOSIS — Z20828 Contact with and (suspected) exposure to other viral communicable diseases: Secondary | ICD-10-CM | POA: Diagnosis not present

## 2020-05-11 DIAGNOSIS — I272 Pulmonary hypertension, unspecified: Secondary | ICD-10-CM | POA: Diagnosis not present

## 2020-05-11 DIAGNOSIS — R7301 Impaired fasting glucose: Secondary | ICD-10-CM | POA: Diagnosis not present

## 2020-05-11 DIAGNOSIS — E538 Deficiency of other specified B group vitamins: Secondary | ICD-10-CM | POA: Diagnosis not present

## 2020-05-11 DIAGNOSIS — Z6841 Body Mass Index (BMI) 40.0 and over, adult: Secondary | ICD-10-CM | POA: Diagnosis not present

## 2020-05-11 DIAGNOSIS — R5383 Other fatigue: Secondary | ICD-10-CM | POA: Diagnosis not present

## 2020-05-11 DIAGNOSIS — Z0000 Encounter for general adult medical examination without abnormal findings: Secondary | ICD-10-CM | POA: Diagnosis not present

## 2020-05-11 DIAGNOSIS — E039 Hypothyroidism, unspecified: Secondary | ICD-10-CM | POA: Diagnosis not present

## 2020-05-11 DIAGNOSIS — I10 Essential (primary) hypertension: Secondary | ICD-10-CM | POA: Diagnosis not present

## 2020-05-11 DIAGNOSIS — E559 Vitamin D deficiency, unspecified: Secondary | ICD-10-CM | POA: Diagnosis not present

## 2020-06-07 ENCOUNTER — Emergency Department (HOSPITAL_BASED_OUTPATIENT_CLINIC_OR_DEPARTMENT_OTHER)
Admission: EM | Admit: 2020-06-07 | Discharge: 2020-06-08 | Disposition: A | Discharge status: Home / Self Care | Payer: PPO | Attending: Emergency Medicine | Admitting: Emergency Medicine

## 2020-06-07 ENCOUNTER — Encounter (HOSPITAL_BASED_OUTPATIENT_CLINIC_OR_DEPARTMENT_OTHER): Payer: Self-pay | Admitting: *Deleted

## 2020-06-07 ENCOUNTER — Emergency Department (HOSPITAL_BASED_OUTPATIENT_CLINIC_OR_DEPARTMENT_OTHER): Payer: PPO

## 2020-06-07 ENCOUNTER — Other Ambulatory Visit: Payer: Self-pay

## 2020-06-07 DIAGNOSIS — E039 Hypothyroidism, unspecified: Secondary | ICD-10-CM | POA: Insufficient documentation

## 2020-06-07 DIAGNOSIS — H539 Unspecified visual disturbance: Secondary | ICD-10-CM | POA: Insufficient documentation

## 2020-06-07 DIAGNOSIS — R079 Chest pain, unspecified: Secondary | ICD-10-CM | POA: Diagnosis not present

## 2020-06-07 DIAGNOSIS — R519 Headache, unspecified: Secondary | ICD-10-CM | POA: Diagnosis not present

## 2020-06-07 DIAGNOSIS — R0602 Shortness of breath: Secondary | ICD-10-CM | POA: Diagnosis not present

## 2020-06-07 DIAGNOSIS — Z79899 Other long term (current) drug therapy: Secondary | ICD-10-CM | POA: Insufficient documentation

## 2020-06-07 DIAGNOSIS — Z7901 Long term (current) use of anticoagulants: Secondary | ICD-10-CM | POA: Insufficient documentation

## 2020-06-07 DIAGNOSIS — R55 Syncope and collapse: Secondary | ICD-10-CM | POA: Insufficient documentation

## 2020-06-07 DIAGNOSIS — R110 Nausea: Secondary | ICD-10-CM | POA: Insufficient documentation

## 2020-06-07 DIAGNOSIS — R42 Dizziness and giddiness: Secondary | ICD-10-CM | POA: Diagnosis not present

## 2020-06-07 DIAGNOSIS — Z86711 Personal history of pulmonary embolism: Secondary | ICD-10-CM | POA: Insufficient documentation

## 2020-06-07 DIAGNOSIS — R072 Precordial pain: Secondary | ICD-10-CM | POA: Diagnosis not present

## 2020-06-07 LAB — COMPREHENSIVE METABOLIC PANEL
ALT: 20 U/L (ref 0–44)
AST: 18 U/L (ref 15–41)
Albumin: 3.7 g/dL (ref 3.5–5.0)
Alkaline Phosphatase: 53 U/L (ref 38–126)
Anion gap: 7 (ref 5–15)
BUN: 15 mg/dL (ref 8–23)
CO2: 25 mmol/L (ref 22–32)
Calcium: 8.8 mg/dL — ABNORMAL LOW (ref 8.9–10.3)
Chloride: 105 mmol/L (ref 98–111)
Creatinine, Ser: 0.76 mg/dL (ref 0.44–1.00)
GFR, Estimated: >60 mL/min (ref >60)
Glucose, Bld: 165 mg/dL — ABNORMAL HIGH (ref 70–99)
Potassium: 3.9 mmol/L (ref 3.5–5.1)
Sodium: 137 mmol/L (ref 135–145)
Total Bilirubin: 0.2 mg/dL — ABNORMAL LOW (ref 0.3–1.2)
Total Protein: 7 g/dL (ref 6.5–8.1)

## 2020-06-07 LAB — CBC
HCT: 41.4 % (ref 36.0–46.0)
Hemoglobin: 13.6 g/dL (ref 12.0–15.0)
MCH: 29.8 pg (ref 26.0–34.0)
MCHC: 32.9 g/dL (ref 30.0–36.0)
MCV: 90.8 fL (ref 80.0–100.0)
Platelets: 238 10*3/uL (ref 150–400)
RBC: 4.56 MIL/uL (ref 3.87–5.11)
RDW: 13.9 % (ref 11.5–15.5)
WBC: 7.5 10*3/uL (ref 4.0–10.5)
nRBC: 0 % (ref 0.0–0.2)

## 2020-06-07 LAB — TROPONIN I (HIGH SENSITIVITY)
Troponin I (High Sensitivity): 3 ng/L (ref <18)
Troponin I (High Sensitivity): 3 ng/L (ref <18)

## 2020-06-07 NOTE — ED Triage Notes (Signed)
C/o increased dizziness and h/a x 3 days

## 2020-06-07 NOTE — ED Notes (Signed)
Patient unable to ambulate due to increase dizziness and very unsteady gait. MD aware.

## 2020-06-07 NOTE — ED Provider Notes (Signed)
[facility] EMERGENCY DEPARTMENT Provider Note   CSN: [id number] Arrival date & time: 06/07/20  2110     History Chief Complaint  Patient presents with  . Dizziness    Gail Olson is a 70 y.o. female.  Gail Olson presents with headaches, lightheadedness, and central burning chest pain x5 minutes today.  The lightheadedness and headaches have been somewhat chronic and have been ongoing for weeks.  She notes that she is under increased stress after losing her sister to COVID-19 in October 2021.  There has been a lot of family conflict surrounding this death as well.  However, on 05/08/2020, she had a syncopal episode.  She was being treated for sinusitis, and after getting ready for bed, she was ambulating into the living room.  She had a sudden syncopal episode, and she landed on her right side.  She was evaluated several days later by her primary care doctor.  She mainly had musculoskeletal pain on the right side, and no imaging of her head was performed.  She is also had lightheadedness that has limited her activities, and she is afraid to stand and cook or go to medical appointments because she is worried she might fall.  She ambulates with a cane at baseline.  Finally, she does have a history of bilateral PEs and is on Eliquis.  She states that she has baseline shortness of breath that has not been worse than normal.  The history is provided by the patient.  Dizziness Quality:  Lightheadedness Severity:  Severe Onset quality:  Gradual Timing:  Intermittent Progression:  Worsening Chronicity:  New Context comment:  Worse with standing and walking but has occurred at rest Relieved by:  Lying down Worsened by:  Standing up Ineffective treatments:  Medication (Does have a history of vertigo, and she has tried some meclizine) Associated symptoms: chest pain (had 5 minutes of burning chest pain at around 7 pm today), headaches (frontal), nausea, shortness of breath  (chronic), syncope (had syncope around May 08, 2020 and headaches worsened. Did not have any imaging at the time) and vision changes (intermittent blurred vision)   Associated symptoms: no palpitations and no vomiting        Past Medical History:  Diagnosis Date  . Age-related osteoporosis without current pathological fracture 12/16/2013   Formatting of this note might be different from the original. Overview:  Due to history of fracture with the background low bone mass (osteopenia)  . Arthritis   . Atypical chest pain 11/01/2015  . Chest pain 03/18/2017  . Chronic deep vein thrombosis (DVT) of proximal vein of left lower extremity (HCC) 03/16/2019  . DVT (deep venous thrombosis) (HCC)   . Dyspnea on exertion 08/01/2015  . Elevated blood-pressure reading without diagnosis of hypertension 05/24/2014   Overview:  IMOUPDATE  . Gastroesophageal reflux disease without esophagitis 05/24/2014   Overview:  Stable on Prilosec. Overview:  Overview:  Stable on Prilosec.  Formatting of this note might be different from the original. Stable on Prilosec. Formatting of this note might be different from the original. Overview:  Stable on Prilosec.  Overview:  Overview:  Stable on Prilosec. Overview:  Overview:  Stable on Prilosec.  Marland Kitchen History of pulmonary embolism 07/30/2018  . Hypothyroidism 02/26/2017  . Lichen sclerosus et atrophicus 12/16/2013  . Nodule of right lung 10/25/2015   Overview:  Per cxr done at [facility] on Aug 24/2017  RUL  Overview:  Overview:  Per cxr done at [facility] on Aug 24/2017  RUL   Formatting of this note might be different from the original. Per cxr done at [facility] on Aug 24/2017  RUL Formatting of this note might be different from the original. Overview:  Per cxr done at [facility] on Aug 24/2017  RUL   Overview:  Overview:  Per cxr done at [facility] on Aug 24/2017  RUL  Overview:  . Obesity (BMI 30-39.9) 04/20/2018  . Palpitations 11/01/2015  . Precordial chest pain 10/07/2017  . Primary hypercoagulable state (HCC) 03/16/2019   . Pulmonary artery hypertension (HCC) 05/21/2017  . Pulmonary embolism (HCC)   . Simple chronic bronchitis (HCC) 05/06/2017  . Sinus tachycardia 08/01/2015  . Thyroid disease   . Vitamin D deficiency 12/16/2013    Patient Active Problem List   Diagnosis Date Noted  . Thyroid disease   . DVT (deep venous thrombosis) (HCC)   . Arthritis   . Primary hypercoagulable state (HCC) 03/16/2019  . Chronic deep vein thrombosis (DVT) of proximal vein of left lower extremity (HCC) 03/16/2019  . History of pulmonary embolism 07/30/2018  . Obesity (BMI 30-39.9) 04/20/2018  . Pulmonary artery hypertension (HCC) 05/21/2017  . Simple chronic bronchitis (HCC) 05/06/2017  . OSA (obstructive sleep apnea) 05/06/2017  . Chest pain 03/18/2017  . Pulmonary embolism (HCC) 02/26/2017  . Hypothyroidism 02/26/2017  . Atypical chest pain 11/01/2015  . Palpitations 11/01/2015  . Nodule of right lung 10/25/2015  . Dyspnea on exertion 08/01/2015  . Sinus tachycardia 08/01/2015  . Gastroesophageal reflux disease without esophagitis 05/24/2014  . Elevated blood-pressure reading without diagnosis of hypertension 05/24/2014  . Lichen sclerosus et atrophicus 12/16/2013  . Vitamin D deficiency 12/16/2013  . Age-related osteoporosis without current pathological fracture 12/16/2013    Past Surgical History:  Procedure Laterality Date  . APPENDECTOMY    . BACK SURGERY    . CHOLECYSTECTOMY    . OVARY SURGERY       OB History   No obstetric history on file.     Family History  Problem Relation Age of Onset  . Hypertension Mother   . Cancer Mother   . Hypertension Sister   . Hypertension Brother   . Pulmonary disease Neg Hx     Social History   Tobacco Use  . Smoking status: Never Smoker  . Smokeless tobacco: Never Used  Vaping Use  . Vaping Use: Never used  Substance Use Topics  . Alcohol use: No  . Drug use: No    Home Medications Prior to Admission medications   Medication Sig Start Date  End Date Taking? Authorizing Provider  acetaminophen (TYLENOL) 650 MG CR tablet Take 650 mg by mouth every 8 (eight) hours as needed for pain.    [provider]  albuterol (PROAIR HFA) 108 (90 Base) MCG/ACT inhaler Inhale 2 puffs into the lungs every 6 (six) hours as needed for wheezing or shortness of breath. 01/30/20   Coralyn Helling, MD  ascorbic acid (VITAMIN C) 500 MG tablet 500 mg daily.    [provider]  betamethasone dipropionate (DIPROLENE) 0.05 % ointment Apply 1 application topically 2 (two) times daily as needed (to affected areas).  01/09/17   [provider]  clotrimazole-betamethasone (LOTRISONE) cream Apply 1 application topically in the morning and at bedtime. 03/13/20   [provider]  ELIQUIS 5 MG TABS tablet TAKE 1 TABLET BY MOUTH TWICE A DAY 04/26/20   Georgeanna Lea, MD  fluticasone (FLONASE) 50 MCG/ACT nasal spray PLACE 2 SPRAYS INTO BOTH NOSTRILS AT BEDTIME  AS NEEDED FOR ALLERGIES OR RHINITIS. Patient taking differently: Place 2 sprays into both nostrils 2 (two) times daily. 07/31/17   Coralyn Helling, MD  fluticasone (FLOVENT HFA) 110 MCG/ACT inhaler Inhale 2 puffs into the lungs 2 (two) times daily as needed (Cough, wheeze, chest congestion, or shortness of breath). 11/20/17   Coralyn Helling, MD  hydrochlorothiazide (MICROZIDE) 12.5 MG capsule Take 12.5 mg by mouth daily as needed for edema or fluid. 03/12/17   [provider]  HYDROcodone-acetaminophen (NORCO/VICODIN) 5-325 MG tablet Take 1 tablet by mouth every 8 (eight) hours as needed for pain. 03/12/17   [provider]  Lactobacillus (PROBIOTIC ACIDOPHILUS PO) Take 1 capsule by mouth daily.    [provider]  levothyroxine (SYNTHROID, LEVOTHROID) 50 MCG tablet Take 50 mcg by mouth daily.  10/06/16   [provider]  loratadine (CLARITIN) 10 MG tablet Take 1 tablet (10 mg total) by mouth daily. Patient taking differently: Take 10 mg by mouth 2 (two) times  daily. 06/10/17   Bevelyn Ngo, NP  Magnesium Oxide 250 MG TABS 250 mg daily.    [provider]  meclizine (ANTIVERT) 25 MG tablet Take 25 mg by mouth 3 (three) times daily as needed. 10/05/19   [provider]  methocarbamol (ROBAXIN) 750 MG tablet Take 750 mg by mouth 3 (three) times daily as needed for muscle spasms.  06/27/16   [provider]  Metoprolol Succinate 50 MG CS24 Take 1 tablet by mouth daily.    [provider]  montelukast (SINGULAIR) 10 MG tablet Take 1 tablet (10 mg total) by mouth at bedtime. 01/30/20   Coralyn Helling, MD  omeprazole (PRILOSEC) 20 MG capsule Take 20 mg by mouth daily as needed (for reflux).  02/06/17   [provider]  OVER THE COUNTER MEDICATION Remefemin tablets: Take 1 tablet by mouth two to three times a week    [provider]  sulfamethoxazole-trimethoprim (BACTRIM DS) 800-160 MG tablet Take 1 tablet by mouth as needed. 07/18/19   [provider]  UNABLE TO FIND Compression stockings, daily 02/28/17   Leatha Gilding, MD  vitamin B-12 (CYANOCOBALAMIN) 1000 MCG tablet Take 1,000 mcg by mouth 2 (two) times daily.     [provider]  Vitamin D, Ergocalciferol, (DRISDOL) 50000 units CAPS capsule Take 50,000 Units by mouth once a week. 06/29/17   [provider]    Allergies    Nsaids, Other, Prednisone, Risedronate, Adhesive [tape], Codeine, and Penicillins  Review of Systems   Review of Systems  Constitutional: Negative for chills and fever.  HENT: Negative for ear pain and sore throat.   Eyes: Negative for pain and visual disturbance.  Respiratory: Positive for shortness of breath (chronic). Negative for cough.   Cardiovascular: Positive for chest pain (had 5 minutes of burning chest pain at around 7 pm today) and syncope (had syncope around 05/01/20 and headaches worsened. Did not have any imaging at the time). Negative for palpitations.  Gastrointestinal: Positive for  nausea. Negative for abdominal pain and vomiting.  Genitourinary: Negative for dysuria and hematuria.  Musculoskeletal: Negative for arthralgias and back pain.  Skin: Negative for color change and rash.  Neurological: Positive for dizziness and headaches (frontal). Negative for seizures and syncope.  All other systems reviewed and are negative.   Physical Exam Updated Vital Signs BP 131/67   Pulse 90   Temp 98.5 F (36.9 C)   Resp 16   Ht 5\' 6"  (1.676 m)  Wt 113.9 kg   SpO2 98%   BMI 40.51 kg/m   Physical Exam Vitals and nursing note reviewed.  Constitutional:      General: She is not in acute distress.    Appearance: She is well-developed.  HENT:     Head: Normocephalic and atraumatic.     Nose: Nose normal.  Eyes:     Extraocular Movements: Extraocular movements intact.     Conjunctiva/sclera: Conjunctivae normal.     Pupils: Pupils are equal, round, and reactive to light.  Cardiovascular:     Rate and Rhythm: Normal rate and regular rhythm.     Heart sounds: No murmur heard.   Pulmonary:     Effort: Pulmonary effort is normal. No respiratory distress.     Breath sounds: Normal breath sounds.  Abdominal:     Palpations: Abdomen is soft.     Tenderness: There is no abdominal tenderness.  Musculoskeletal:        General: No swelling. Normal range of motion.     Cervical back: Neck supple.  Skin:    General: Skin is warm and dry.  Neurological:     General: No focal deficit present.     Mental Status: She is alert and oriented to person, place, and time.     Cranial Nerves: No cranial nerve deficit.     Sensory: No sensory deficit.     Motor: No weakness.     Coordination: Coordination normal.  Psychiatric:        Mood and Affect: Mood normal.     ED Results / Procedures / Treatments   Labs (all labs ordered are listed, but only abnormal results are displayed) Labs Reviewed  COMPREHENSIVE METABOLIC PANEL - Abnormal; Notable for the following  components:      Result Value   Glucose, Bld 165 (*)    Calcium 8.8 (*)    Total Bilirubin 0.2 (*)    All other components within normal limits  CBC  TROPONIN I (HIGH SENSITIVITY)  TROPONIN I (HIGH SENSITIVITY)    EKG EKG Interpretation  Date/Time:  Thursday June 07 2020 21:25:39 EDT Ventricular Rate:  92 PR Interval:  145 QRS Duration: 96 QT Interval:  369 QTC Calculation: 457 R Axis:   -33 Text Interpretation: Sinus rhythm Left axis deviation Abnormal R-wave progression, early transition Minimal ST depression, lateral leads artifact in I and II No acute ischemia Left axis deviation has been seen on prior EKGs. Confirmed by Pieter PartridgeWright, Ezreal Turay ([id number]) on 06/07/2020 9:31:03 PM   Radiology CT Head Wo Contrast  Result Date: 06/07/2020 CLINICAL DATA:  Headache EXAM: CT HEAD WITHOUT CONTRAST TECHNIQUE: Contiguous axial images were obtained from the base of the skull through the vertex without intravenous contrast. COMPARISON:  02/08/2007 FINDINGS: Brain: No acute intracranial abnormality. Specifically, no hemorrhage, hydrocephalus, mass lesion, acute infarction, or significant intracranial injury. Vascular: No hyperdense vessel or unexpected calcification. Skull: No acute calvarial abnormality. Sinuses/Orbits: No acute finding Other: None IMPRESSION: No acute intracranial abnormality. Electronically Signed   By: Charlett NoseKevin  Dover M.D.   On: 06/07/2020 22:43   DG Chest Port 1 View  Result Date: 06/07/2020 CLINICAL DATA:  Chest pain, dizziness EXAM: PORTABLE CHEST 1 VIEW COMPARISON:  10/07/2017 FINDINGS: Heart and mediastinal contours are within normal limits. No focal opacities or effusions. No acute bony abnormality. IMPRESSION: No active disease. Electronically Signed   By: Charlett NoseKevin  Dover M.D.   On: 06/07/2020 22:47    Procedures Procedures   Medications Ordered in ED Medications -  No data to display  ED Course  I have reviewed the triage vital signs and the nursing notes.  Pertinent labs &  imaging results that were available during my care of the patient were reviewed by me and considered in my medical decision making (see chart for details).    MDM Rules/Calculators/A&P                          Gail Olson presented with lightheadedness and headache that had been somewhat chronic.  She also presented with 5 minutes of burning chest pain. She was evaluated for ACS and risk stratified according to the HEART pathway. I considered orthostatic hypotension, exertional hypoxia. I considered chronic subdural hematoma as a cause of her headache. I also considered migraine headache. Unlikely subarachnoid hemorrhage given the chronicity.   Final Clinical Impression(s) / ED Diagnoses Final diagnoses:  Lightheadedness  Chest pain, unspecified type  Nonintractable headache, unspecified chronicity pattern, unspecified headache type    Rx / DC Orders ED Discharge Orders    None       Koleen Distance, MD 06/09/20 [phone]

## 2020-06-20 ENCOUNTER — Encounter: Payer: Self-pay | Admitting: Cardiology

## 2020-06-20 ENCOUNTER — Ambulatory Visit: Payer: PPO | Admitting: Cardiology

## 2020-06-20 ENCOUNTER — Other Ambulatory Visit: Payer: Self-pay

## 2020-06-20 ENCOUNTER — Ambulatory Visit (INDEPENDENT_AMBULATORY_CARE_PROVIDER_SITE_OTHER): Payer: PPO

## 2020-06-20 VITALS — BP 128/80 | HR 86 | Ht 65.5 in | Wt 255.0 lb

## 2020-06-20 DIAGNOSIS — G4733 Obstructive sleep apnea (adult) (pediatric): Secondary | ICD-10-CM

## 2020-06-20 DIAGNOSIS — R002 Palpitations: Secondary | ICD-10-CM

## 2020-06-20 DIAGNOSIS — R079 Chest pain, unspecified: Secondary | ICD-10-CM

## 2020-06-20 DIAGNOSIS — I2782 Chronic pulmonary embolism: Secondary | ICD-10-CM | POA: Diagnosis not present

## 2020-06-20 DIAGNOSIS — R55 Syncope and collapse: Secondary | ICD-10-CM | POA: Diagnosis not present

## 2020-06-20 NOTE — Addendum Note (Signed)
Addended by: Hazle Quant on: 06/20/2020 03:24 PM   Modules accepted: Orders

## 2020-06-20 NOTE — Patient Instructions (Signed)
Medication Instructions:  Your physician recommends that you continue on your current medications as directed. Please refer to the Current Medication list given to you today.  *If you need a refill on your cardiac medications before your next appointment, please call your pharmacy*   Lab Work: None If you have labs (blood work) drawn today and your tests are completely normal, you will receive your results only by: . MyChart Message (if you have MyChart) OR . A paper copy in the mail If you have any lab test that is abnormal or we need to change your treatment, we will call you to review the results.   Testing/Procedures: A zio monitor was ordered today. It will remain on for 7 days. You will then return monitor and event diary in provided box. It takes 1-2 weeks for report to be downloaded and returned to us. We will call you with the results. If monitor falls off or has orange flashing light, please call Zio for further instructions.      Follow-Up: At [facility], you and your health needs are our priority.  As part of our continuing mission to provide you with exceptional heart care, we have created designated Provider Care Teams.  These Care Teams include your primary Cardiologist (physician) and Advanced Practice Providers (APPs -  Physician Assistants and Nurse Practitioners) who all work together to provide you with the care you need, when you need it.  We recommend signing up for the patient portal called "MyChart".  Sign up information is provided on this After Visit Summary.  MyChart is used to connect with patients for Virtual Visits (Telemedicine).  Patients are able to view lab/test results, encounter notes, upcoming appointments, etc.  Non-urgent messages can be sent to your provider as well.   To learn more about what you can do with MyChart, go to [email or web address].    Your next appointment:   1 month(s)  The format for your next appointment:   In  Person  Provider:   Robert Krasowski, MD   Other Instructions   

## 2020-06-20 NOTE — Progress Notes (Signed)
Cardiology Office Note:    Date:  06/20/2020   ID:  Gail Olson, DOB 16-May-1950, MRN [id number]  PCP:  Shellia Cleverly, PA  Cardiologist:  Gypsy Balsam, MD    Referring MD: Shellia Cleverly, [place]   Chief Complaint  Patient presents with  . Dizziness  . Loss of Consciousness    Recent episodes started on 05/01/2020    History of Present Illness:    Gail Olson is a 70 y.o. female with past medical history significant for DVT, PE, she is anticoagulated, essential hypertension, obstructive sleep apnea, sinus tachycardia.  She presented to our office again because of episode of dizziness and syncope that she suffered a few days ago.  Apparently she was getting ready for bed.  She was trying to walk to the bathroom.  She started feeling dizzy next and she knows she was on the floor she injured her right side of her body.  She ended going to the emergency room work-up in the emergency was negative.  She also described to have some episode of dizziness since then she had difficulty moving around and walking around because of dizziness she does use a walker.  She complained of having some sinus problems she is being treated with doxycycline for it.  She already finished her therapy yesterday.  She increase fluid intakes with some improvement but still complaining of having dizziness and really bothers her a lot.  She does not have any palpitation what happened just dizziness.  Past Medical History:  Diagnosis Date  . Age-related osteoporosis without current pathological fracture 12/16/2013   Formatting of this note might be different from the original. Overview:  Due to history of fracture with the background low bone mass (osteopenia)  . Arthritis   . Atypical chest pain 11/01/2015  . Chest pain 03/18/2017  . Chronic deep vein thrombosis (DVT) of proximal vein of left lower extremity (HCC) 03/16/2019  . DVT (deep venous thrombosis) (HCC)   . Dyspnea on exertion 08/01/2015  . Elevated  blood-pressure reading without diagnosis of hypertension 05/24/2014   Overview:  IMOUPDATE  . Gastroesophageal reflux disease without esophagitis 05/24/2014   Overview:  Stable on Prilosec. Overview:  Overview:  Stable on Prilosec.  Formatting of this note might be different from the original. Stable on Prilosec. Formatting of this note might be different from the original. Overview:  Stable on Prilosec.  Overview:  Overview:  Stable on Prilosec. Overview:  Overview:  Stable on Prilosec.  Marland Kitchen History of pulmonary embolism 07/30/2018  . Hypothyroidism 02/26/2017  . Lichen sclerosus et atrophicus 12/16/2013  . Nodule of right lung 10/25/2015   Overview:  Per cxr done at [facility] on Aug 24/2017  RUL  Overview:  Overview:  Per cxr done at [facility] on Aug 24/2017  RUL   Formatting of this note might be different from the original. Per cxr done at [facility] on Aug 24/2017  RUL Formatting of this note might be different from the original. Overview:  Per cxr done at [facility] on Aug 24/2017  RUL   Overview:  Overview:  Per cxr done at [facility] on Aug 24/2017  RUL  Overview:  . Obesity (BMI 30-39.9) 04/20/2018  . OSA (obstructive sleep apnea) 05/06/2017   ONO with RA 04/27/17 >> test time 8 hrs 21 min.  Basal SpO2 92%, low SpO2 83%.  Spent 18 min with SpO2 < 88%. ONO shows intermittent oxygen desaturation, and pattern is concerning for sleep apnea.   . Palpitations  11/01/2015  . Precordial chest pain 10/07/2017  . Primary hypercoagulable state (HCC) 03/16/2019  . Pulmonary artery hypertension (HCC) 05/21/2017  . Pulmonary embolism (HCC)   . Simple chronic bronchitis (HCC) 05/06/2017  . Sinus tachycardia 08/01/2015  . Thyroid disease   . Vitamin D deficiency 12/16/2013    Past Surgical History:  Procedure Laterality Date  . APPENDECTOMY    . BACK SURGERY    . CHOLECYSTECTOMY    . OVARY SURGERY      Current Medications: Current Meds  Medication Sig  . acetaminophen (TYLENOL) 650 MG CR tablet Take 650 mg by mouth every 8 (eight) hours as  needed for pain.  Marland Kitchen albuterol (PROAIR HFA) 108 (90 Base) MCG/ACT inhaler Inhale 2 puffs into the lungs every 6 (six) hours as needed for wheezing or shortness of breath.  Marland Kitchen ascorbic acid (VITAMIN C) 500 MG tablet Take 500 mg by mouth daily.  . betamethasone dipropionate (DIPROLENE) 0.05 % ointment Apply 1 application topically 2 (two) times daily as needed (rash).  . clotrimazole-betamethasone (LOTRISONE) cream Apply 1 application topically in the morning and at bedtime.  Marland Kitchen ELIQUIS 5 MG TABS tablet TAKE 1 TABLET BY MOUTH TWICE A DAY (Patient taking differently: Take 5 mg by mouth 2 (two) times daily.)  . fluticasone (FLONASE) 50 MCG/ACT nasal spray PLACE 2 SPRAYS INTO BOTH NOSTRILS AT BEDTIME AS NEEDED FOR ALLERGIES OR RHINITIS. (Patient taking differently: Place 2 sprays into both nostrils 2 (two) times daily.)  . fluticasone (FLOVENT HFA) 110 MCG/ACT inhaler Inhale 2 puffs into the lungs 2 (two) times daily as needed (Cough, wheeze, chest congestion, or shortness of breath).  . hydrochlorothiazide (MICROZIDE) 12.5 MG capsule Take 12.5 mg by mouth daily as needed for edema or fluid.  Marland Kitchen HYDROcodone-acetaminophen (NORCO/VICODIN) 5-325 MG tablet Take 1 tablet by mouth every 8 (eight) hours as needed for pain.  . Lactobacillus (PROBIOTIC ACIDOPHILUS PO) Take 1 capsule by mouth daily. Unknown strenght  . levothyroxine (SYNTHROID, LEVOTHROID) 50 MCG tablet Take 50 mcg by mouth daily.   Marland Kitchen loratadine (CLARITIN) 10 MG tablet Take 1 tablet (10 mg total) by mouth daily. (Patient taking differently: Take 10 mg by mouth 2 (two) times daily.)  . meclizine (ANTIVERT) 25 MG tablet Take 25 mg by mouth 3 (three) times daily as needed for dizziness.  . methocarbamol (ROBAXIN) 750 MG tablet Take 750 mg by mouth 3 (three) times daily as needed for muscle spasms.   . Metoprolol Succinate 50 MG CS24 Take 1 tablet by mouth daily.  . montelukast (SINGULAIR) 10 MG tablet Take 1 tablet (10 mg total) by mouth at bedtime.  (Patient taking differently: Take 10 mg by mouth as needed (Allergies).)  . omeprazole (PRILOSEC) 20 MG capsule Take 20 mg by mouth daily as needed (for reflux).   Marland Kitchen sulfamethoxazole-trimethoprim (BACTRIM DS) 800-160 MG tablet Take 1 tablet by mouth as needed (UTI).  . Vitamin D, Ergocalciferol, (DRISDOL) 50000 units CAPS capsule Take 50,000 Units by mouth once a week.  . [DISCONTINUED] vitamin B-12 (CYANOCOBALAMIN) 1000 MCG tablet Take 1,000 mcg by mouth 2 (two) times daily.      Allergies:   Nsaids, Other, Prednisone, Risedronate, Adhesive [tape], Codeine, and Penicillins   Social History   Socioeconomic History  . Marital status: Married    Spouse name: Not on file  . Number of children: Not on file  . Years of education: Not on file  . Highest education level: Not on file  Occupational History  . Not on  file  Tobacco Use  . Smoking status: Never Smoker  . Smokeless tobacco: Never Used  Vaping Use  . Vaping Use: Never used  Substance and Sexual Activity  . Alcohol use: No  . Drug use: No  . Sexual activity: Not on file  Other Topics Concern  . Not on file  Social History Narrative  . Not on file   Social Determinants of Health   Financial Resource Strain: Not on file  Food Insecurity: Not on file  Transportation Needs: Not on file  Physical Activity: Not on file  Stress: Not on file  Social Connections: Not on file     Family History: The patient's family history includes Cancer in her mother; Colon cancer in her brother; Hypertension in her brother, mother, and sister. There is no history of Pulmonary disease. ROS:   Please see the history of present illness.    All 14 point review of systems negative except as described per history of present illness  EKGs/Labs/Other Studies Reviewed:      Recent Labs: 06/07/2020: ALT 20; BUN 15; Creatinine, Ser 0.76; Hemoglobin 13.6; Platelets 238; Potassium 3.9; Sodium 137  Recent Lipid Panel No results found for: CHOL,  TRIG, HDL, CHOLHDL, VLDL, LDLCALC, LDLDIRECT  Physical Exam:    VS:  BP 128/80 (BP Location: Left Arm, Patient Position: Sitting)   Pulse 86   Ht 5' 5.5" (1.664 m)   Wt 255 lb (115.7 kg)   SpO2 96%   BMI 41.79 kg/m     Wt Readings from Last 3 Encounters:  06/20/20 255 lb (115.7 kg)  06/07/20 251 lb (113.9 kg)  04/05/20 265 lb (120.2 kg)     GEN:  Well nourished, well developed in no acute distress HEENT: Normal NECK: No JVD; No carotid bruits LYMPHATICS: No lymphadenopathy CARDIAC: RRR, no murmurs, no rubs, no gallops RESPIRATORY:  Clear to auscultation without rales, wheezing or rhonchi  ABDOMEN: Soft, non-tender, non-distended MUSCULOSKELETAL:  No edema; No deformity  SKIN: Warm and dry LOWER EXTREMITIES: no swelling NEUROLOGIC:  Alert and oriented x 3 PSYCHIATRIC:  Normal affect   ASSESSMENT:    1. Other chronic pulmonary embolism without acute cor pulmonale (HCC)   2. Syncope and collapse   3. OSA (obstructive sleep apnea)   4. Palpitations   5. Chest pain, unspecified type    PLAN:    In order of problems listed above:  1. Syncope and collapse.  We will put Zio patch for week make sure she does not have any significant arrhythmia. 2. Palpitations again Zio patch will be done. 3. Chronic pulmonary emboli.  She is anticoagulant which I will continue. 4. Chest pain she described to have some uneasy sensation in the chest when she was getting very dizzy.  Work-up from the emergency room was negative. 5. I did review emergency record for this visit.   Medication Adjustments/Labs and Tests Ordered: Current medicines are reviewed at length with the patient today.  Concerns regarding medicines are outlined above.  No orders of the defined types were placed in this encounter.  Medication changes: No orders of the defined types were placed in this encounter.   Signed, Georgeanna Lea, MD, [facility] 06/20/2020 3:14 PM    [facility]

## 2020-06-30 ENCOUNTER — Emergency Department (HOSPITAL_BASED_OUTPATIENT_CLINIC_OR_DEPARTMENT_OTHER): Payer: PPO

## 2020-06-30 ENCOUNTER — Emergency Department (HOSPITAL_BASED_OUTPATIENT_CLINIC_OR_DEPARTMENT_OTHER)
Admission: EM | Admit: 2020-06-30 | Discharge: 2020-06-30 | Disposition: A | Discharge status: Home / Self Care | Payer: PPO | Attending: Emergency Medicine | Admitting: Emergency Medicine

## 2020-06-30 ENCOUNTER — Other Ambulatory Visit: Payer: Self-pay

## 2020-06-30 ENCOUNTER — Encounter (HOSPITAL_BASED_OUTPATIENT_CLINIC_OR_DEPARTMENT_OTHER): Payer: Self-pay | Admitting: Emergency Medicine

## 2020-06-30 DIAGNOSIS — R42 Dizziness and giddiness: Secondary | ICD-10-CM | POA: Diagnosis not present

## 2020-06-30 DIAGNOSIS — R079 Chest pain, unspecified: Secondary | ICD-10-CM | POA: Diagnosis not present

## 2020-06-30 DIAGNOSIS — J9811 Atelectasis: Secondary | ICD-10-CM | POA: Diagnosis not present

## 2020-06-30 DIAGNOSIS — E039 Hypothyroidism, unspecified: Secondary | ICD-10-CM | POA: Insufficient documentation

## 2020-06-30 DIAGNOSIS — R55 Syncope and collapse: Secondary | ICD-10-CM | POA: Diagnosis not present

## 2020-06-30 DIAGNOSIS — Z7901 Long term (current) use of anticoagulants: Secondary | ICD-10-CM | POA: Diagnosis not present

## 2020-06-30 DIAGNOSIS — R110 Nausea: Secondary | ICD-10-CM | POA: Insufficient documentation

## 2020-06-30 DIAGNOSIS — Z79899 Other long term (current) drug therapy: Secondary | ICD-10-CM | POA: Insufficient documentation

## 2020-06-30 LAB — BASIC METABOLIC PANEL
Anion gap: 8 (ref 5–15)
BUN: 14 mg/dL (ref 8–23)
CO2: 22 mmol/L (ref 22–32)
Calcium: 8.8 mg/dL — ABNORMAL LOW (ref 8.9–10.3)
Chloride: 107 mmol/L (ref 98–111)
Creatinine, Ser: 0.74 mg/dL (ref 0.44–1.00)
GFR, Estimated: >60 mL/min (ref >60)
Glucose, Bld: 179 mg/dL — ABNORMAL HIGH (ref 70–99)
Potassium: 3.6 mmol/L (ref 3.5–5.1)
Sodium: 137 mmol/L (ref 135–145)

## 2020-06-30 LAB — TROPONIN I (HIGH SENSITIVITY)
Troponin I (High Sensitivity): 5 ng/L (ref <18)
Troponin I (High Sensitivity): 5 ng/L (ref <18)

## 2020-06-30 LAB — CBC
HCT: 41.5 % (ref 36.0–46.0)
Hemoglobin: 13.6 g/dL (ref 12.0–15.0)
MCH: 29.6 pg (ref 26.0–34.0)
MCHC: 32.8 g/dL (ref 30.0–36.0)
MCV: 90.4 fL (ref 80.0–100.0)
Platelets: 217 10*3/uL (ref 150–400)
RBC: 4.59 MIL/uL (ref 3.87–5.11)
RDW: 13.7 % (ref 11.5–15.5)
WBC: 7.6 10*3/uL (ref 4.0–10.5)
nRBC: 0 % (ref 0.0–0.2)

## 2020-06-30 LAB — PROTIME-INR
INR: 1 (ref 0.8–1.2)
Prothrombin Time: 13.3 seconds (ref 11.4–15.2)

## 2020-06-30 LAB — D-DIMER, QUANTITATIVE: D-Dimer, Quant: 0.87 ug/mL-FEU — ABNORMAL HIGH (ref 0.00–0.50)

## 2020-06-30 MED ORDER — IOHEXOL 350 MG/ML SOLN
100.0000 mL | Freq: Once | INTRAVENOUS | Status: AC | PRN
  Administered 2020-06-30: 80 mL via INTRAVENOUS

## 2020-06-30 NOTE — ED Triage Notes (Signed)
Pt arrives pov with c/o CP, lightheadedness and HA. Pt endorses similar symptoms 3 weeks pta. 500mg Tylenol 1 hr pta

## 2020-06-30 NOTE — ED Notes (Signed)
Near syncope episode at a birthday party around 2:30 today

## 2020-06-30 NOTE — ED Provider Notes (Signed)
[facility] EMERGENCY DEPARTMENT Provider Note   CSN: [id number] Arrival date & time: 06/30/20  1422     History Chief Complaint  Patient presents with  . Chest Pain    Gail Olson is a 70 y.o. female.  Patient's had multiple episodes of lightheadedness.  Feels flushed feels near syncopal.  Has nausea.  Denies chest pain.  Denies fever denies trauma.  Has had work-up in the past with no findings thus far.  Patient cannot distinguish what causes them or what makes them better.        Past Medical History:  Diagnosis Date  . Age-related osteoporosis without current pathological fracture 12/16/2013   Formatting of this note might be different from the original. Overview:  Due to history of fracture with the background low bone mass (osteopenia)  . Arthritis   . Atypical chest pain 11/01/2015  . Chest pain 03/18/2017  . Chronic deep vein thrombosis (DVT) of proximal vein of left lower extremity (HCC) 03/16/2019  . DVT (deep venous thrombosis) (HCC)   . Dyspnea on exertion 08/01/2015  . Elevated blood-pressure reading without diagnosis of hypertension 05/24/2014   Overview:  IMOUPDATE  . Gastroesophageal reflux disease without esophagitis 05/24/2014   Overview:  Stable on Prilosec. Overview:  Overview:  Stable on Prilosec.  Formatting of this note might be different from the original. Stable on Prilosec. Formatting of this note might be different from the original. Overview:  Stable on Prilosec.  Overview:  Overview:  Stable on Prilosec. Overview:  Overview:  Stable on Prilosec.  Marland Kitchen History of pulmonary embolism 07/30/2018  . Hypothyroidism 02/26/2017  . Lichen sclerosus et atrophicus 12/16/2013  . Nodule of right lung 10/25/2015   Overview:  Per cxr done at [facility] on Aug 24/2017  RUL  Overview:  Overview:  Per cxr done at [facility] on Aug 24/2017  RUL   Formatting of this note might be different from the original. Per cxr done at [facility] on Aug 24/2017  RUL Formatting of this note might  be different from the original. Overview:  Per cxr done at [facility] on Aug 24/2017  RUL   Overview:  Overview:  Per cxr done at [facility] on Aug 24/2017  RUL  Overview:  . Obesity (BMI 30-39.9) 04/20/2018  . OSA (obstructive sleep apnea) 05/06/2017   ONO with RA 04/27/17 >> test time 8 hrs 21 min.  Basal SpO2 92%, low SpO2 83%.  Spent 18 min with SpO2 < 88%. ONO shows intermittent oxygen desaturation, and pattern is concerning for sleep apnea.   . Palpitations 11/01/2015  . Precordial chest pain 10/07/2017  . Primary hypercoagulable state (HCC) 03/16/2019  . Pulmonary artery hypertension (HCC) 05/21/2017  . Pulmonary embolism (HCC)   . Simple chronic bronchitis (HCC) 05/06/2017  . Sinus tachycardia 08/01/2015  . Thyroid disease   . Vitamin D deficiency 12/16/2013    Patient Active Problem List   Diagnosis Date Noted  . Syncope and collapse 06/20/2020  . Thyroid disease   . DVT (deep venous thrombosis) (HCC)   . Arthritis   . Primary hypercoagulable state (HCC) 03/16/2019  . Chronic deep vein thrombosis (DVT) of proximal vein of left lower extremity (HCC) 03/16/2019  . History of pulmonary embolism 07/30/2018  . Obesity (BMI 30-39.9) 04/20/2018  . Precordial chest pain 10/07/2017  . Pulmonary artery hypertension (HCC) 05/21/2017  . Simple chronic bronchitis (HCC) 05/06/2017  . OSA (obstructive sleep apnea) 05/06/2017  . Chest pain 03/18/2017  . Pulmonary embolism (HCC) 02/26/2017  .  Hypothyroidism 02/26/2017  . Atypical chest pain 11/01/2015  . Palpitations 11/01/2015  . Nodule of right lung 10/25/2015  . Dyspnea on exertion 08/01/2015  . Sinus tachycardia 08/01/2015  . Gastroesophageal reflux disease without esophagitis 05/24/2014  . Elevated blood-pressure reading without diagnosis of hypertension 05/24/2014  . Lichen sclerosus et atrophicus 12/16/2013  . Vitamin D deficiency 12/16/2013  . Age-related osteoporosis without current pathological fracture 12/16/2013    Past Surgical History:   Procedure Laterality Date  . APPENDECTOMY    . BACK SURGERY    . CHOLECYSTECTOMY    . OVARY SURGERY       OB History   No obstetric history on file.     Family History  Problem Relation Age of Onset  . Hypertension Mother   . Cancer Mother   . Hypertension Sister   . Hypertension Brother   . Colon cancer Brother   . Pulmonary disease Neg Hx     Social History   Tobacco Use  . Smoking status: Never Smoker  . Smokeless tobacco: Never Used  Vaping Use  . Vaping Use: Never used  Substance Use Topics  . Alcohol use: No  . Drug use: No    Home Medications Prior to Admission medications   Medication Sig Start Date End Date Taking? Authorizing Provider  acetaminophen (TYLENOL) 650 MG CR tablet Take 650 mg by mouth every 8 (eight) hours as needed for pain.    [provider]  albuterol (PROAIR HFA) 108 (90 Base) MCG/ACT inhaler Inhale 2 puffs into the lungs every 6 (six) hours as needed for wheezing or shortness of breath. 01/30/20   Coralyn HellingSood, Vineet, MD  ascorbic acid (VITAMIN C) 500 MG tablet Take 500 mg by mouth daily.    [provider]  betamethasone dipropionate (DIPROLENE) 0.05 % ointment Apply 1 application topically 2 (two) times daily as needed (rash). 01/09/17   [provider]  clotrimazole-betamethasone (LOTRISONE) cream Apply 1 application topically in the morning and at bedtime. 03/13/20   [provider]  ELIQUIS 5 MG TABS tablet TAKE 1 TABLET BY MOUTH TWICE A DAY Patient taking differently: Take 5 mg by mouth 2 (two) times daily. 04/26/20   [name], [name], MD  fluticasone (FLONASE) 50 MCG/ACT nasal spray PLACE 2 SPRAYS INTO BOTH NOSTRILS AT BEDTIME AS NEEDED FOR ALLERGIES OR RHINITIS. Patient taking differently: Place 2 sprays into both nostrils 2 (two) times daily. 07/31/17   Coralyn HellingSood, Vineet, MD  fluticasone (FLOVENT HFA) 110 MCG/ACT inhaler Inhale 2 puffs into the lungs 2 (two) times daily as needed (Cough, wheeze, chest  congestion, or shortness of breath). 11/20/17   Coralyn HellingSood, Vineet, MD  hydrochlorothiazide (MICROZIDE) 12.5 MG capsule Take 12.5 mg by mouth daily as needed for edema or fluid. 03/12/17   [provider]  HYDROcodone-acetaminophen (NORCO/VICODIN) 5-325 MG tablet Take 1 tablet by mouth every 8 (eight) hours as needed for pain. 03/12/17   [provider]  Lactobacillus (PROBIOTIC ACIDOPHILUS PO) Take 1 capsule by mouth daily. Unknown strenght    [provider]  levothyroxine (SYNTHROID, LEVOTHROID) 50 MCG tablet Take 50 mcg by mouth daily.  10/06/16   [provider]  loratadine (CLARITIN) 10 MG tablet Take 1 tablet (10 mg total) by mouth daily. Patient taking differently: Take 10 mg by mouth 2 (two) times daily. 06/10/17   Bevelyn NgoGroce, Sarah F, NP  meclizine (ANTIVERT) 25 MG tablet Take 25 mg by mouth 3 (three) times daily as needed for dizziness. 10/05/19   [provider]  methocarbamol (ROBAXIN) 750 MG tablet Take 750 mg by mouth 3 (three) times daily as needed for muscle spasms.  06/27/16   [provider]  Metoprolol Succinate 50 MG CS24 Take 1 tablet by mouth daily.    [provider]  montelukast (SINGULAIR) 10 MG tablet Take 1 tablet (10 mg total) by mouth at bedtime. Patient taking differently: Take 10 mg by mouth as needed (Allergies). 01/30/20   Coralyn Helling, MD  omeprazole (PRILOSEC) 20 MG capsule Take 20 mg by mouth daily as needed (for reflux).  02/06/17   [provider]  sulfamethoxazole-trimethoprim (BACTRIM DS) 800-160 MG tablet Take 1 tablet by mouth as needed (UTI). 07/18/19   [provider]  Vitamin D, Ergocalciferol, (DRISDOL) 50000 units CAPS capsule Take 50,000 Units by mouth once a week. 06/29/17   [provider]    Allergies    Nsaids, Other, Prednisone, Risedronate, Adhesive [tape], Codeine, and Penicillins  Review of Systems   Review of Systems  Constitutional: Negative for chills and fever.  HENT:  Negative for congestion and rhinorrhea.   Respiratory: Negative for cough and shortness of breath.   Cardiovascular: Negative for chest pain and palpitations.  Gastrointestinal: Negative for diarrhea, nausea and vomiting.  Genitourinary: Negative for difficulty urinating and dysuria.  Musculoskeletal: Negative for arthralgias and back pain.  Skin: Negative for rash and wound.  Neurological: Positive for light-headedness. Negative for headaches.    Physical Exam Updated Vital Signs BP 117/70   Pulse 88   Temp 97.9 F (36.6 C) (Oral)   Resp 17   Ht 5\' 6"  (1.676 m)   Wt 113.4 kg   SpO2 97%   BMI 40.35 kg/m   Physical Exam Vitals and nursing note reviewed. Exam conducted with a chaperone present.  Constitutional:      General: She is not in acute distress.    Appearance: Normal appearance.  HENT:     Head: Normocephalic and atraumatic.     Nose: No rhinorrhea.  Eyes:     General:        Right eye: No discharge.        Left eye: No discharge.     Conjunctiva/sclera: Conjunctivae normal.  Cardiovascular:     Rate and Rhythm: Normal rate and regular rhythm.     Heart sounds:   No systolic murmur is present.  No diastolic murmur is present. No friction rub.  Pulmonary:     Effort: Pulmonary effort is normal. No respiratory distress.     Breath sounds: No stridor. No decreased breath sounds or wheezing.  Abdominal:     General: Abdomen is flat. There is no distension.     Palpations: Abdomen is soft.  Musculoskeletal:        General: No tenderness or signs of injury.     Right lower leg: No edema.     Left lower leg: No edema.  Skin:    General: Skin is warm and dry.  Neurological:     General: No focal deficit present.     Mental Status: She is alert. Mental status is at baseline.     Motor: No weakness.  Psychiatric:        Mood and Affect: Mood normal.        Behavior: Behavior normal.     ED Results / Procedures / Treatments   Labs (all labs ordered are  listed, but only abnormal results are displayed) Labs Reviewed  BASIC METABOLIC PANEL - Abnormal;  Notable for the following components:      Result Value   Glucose, Bld 179 (*)    Calcium 8.8 (*)    All other components within normal limits  D-DIMER, QUANTITATIVE - Abnormal; Notable for the following components:   D-Dimer, Quant 0.87 (*)    All other components within normal limits  CBC  PROTIME-INR  TROPONIN I (HIGH SENSITIVITY)  TROPONIN I (HIGH SENSITIVITY)    EKG EKG Interpretation  Date/Time:  Saturday June 30 2020 14:32:20 EDT Ventricular Rate:  108 PR Interval:  138 QRS Duration: 78 QT Interval:  336 QTC Calculation: 450 R Axis:   -43 Text Interpretation: Sinus tachycardia Left axis deviation Minimal voltage criteria for LVH, may be normal variant ( R in aVL ) Abnormal ECG Confirmed by Cherlynn Perches ([id number]) on 06/30/2020 3:28:32 PM   Radiology CT Angio Chest PE W and/or Wo Contrast  Result Date: 06/30/2020 CLINICAL DATA:  Chest pain EXAM: CT ANGIOGRAPHY CHEST WITH CONTRAST TECHNIQUE: Multidetector CT imaging of the chest was performed using the standard protocol during bolus administration of intravenous contrast. Multiplanar CT image reconstructions and MIPs were obtained to evaluate the vascular anatomy. CONTRAST:  27mL OMNIPAQUE IOHEXOL 350 MG/ML SOLN COMPARISON:  None. FINDINGS: Cardiovascular: Satisfactory opacification of the pulmonary arteries to the proximal segmental level. No evidence of pulmonary embolism. Normal heart size. No pericardial effusion. Thoracic aorta is normal in caliber with minor calcified plaque Mediastinum/Nodes: No enlarged lymph nodes. Thyroid and esophagus are unremarkable. Lungs/Pleura: No pleural effusion or pneumothorax. Minor patchy subsegmental atelectasis. Upper Abdomen: No acute abnormality. Musculoskeletal: No acute osseous abnormality. Chronic compression fractures with cement augmentation at T11 and T12. Review of the MIP images confirms  the above findings. IMPRESSION: No acute pulmonary embolism or other acute abnormality. Electronically Signed   By: Guadlupe Spanish M.D.   On: 06/30/2020 18:41   DG Chest Port 1 View  Result Date: 06/30/2020 CLINICAL DATA:  Near syncope, nausea EXAM: PORTABLE CHEST 1 VIEW COMPARISON:  06/07/2020 FINDINGS: The heart size and mediastinal contours are within normal limits. Both lungs are clear. The visualized skeletal structures are unremarkable. IMPRESSION: No active disease. Electronically Signed   By: Sharlet Salina M.D.   On: 06/30/2020 16:43    Procedures Procedures   Medications Ordered in ED Medications  iohexol (OMNIPAQUE) 350 MG/ML injection 100 mL (80 mLs Intravenous Contrast Given 06/30/20 1819)    ED Course  I have reviewed the triage vital signs and the nursing notes.  Pertinent labs & imaging results that were available during my care of the patient were reviewed by me and considered in my medical decision making (see chart for details).    MDM Rules/Calculators/A&P                          Intermittent lightheadedness and near syncope.  York Spaniel this worked up in the past.  Of note no significant findings.  Recently completed heart monitor but had no episodes during that.  Is following up with cardiology in the coming weeks.  Here is chest pain-free is unremarkable EKG and laboratory studies except for elevated D-dimer.  D-dimer is followed up by CT PE study is negative after my and radiology review.  She is safe for discharge home with return precautions given the chronicity of her symptoms and lack of significant progression.  Return precautions discussed  Final Clinical Impression(s) / ED Diagnoses Final diagnoses:  Lightheaded    Rx / DC Orders  ED Discharge Orders    None       Sabino Donovan, MD 07/02/20 0020

## 2020-07-04 DIAGNOSIS — R55 Syncope and collapse: Secondary | ICD-10-CM | POA: Diagnosis not present

## 2020-07-04 DIAGNOSIS — R002 Palpitations: Secondary | ICD-10-CM | POA: Diagnosis not present

## 2020-07-12 ENCOUNTER — Telehealth: Payer: Self-pay | Admitting: Emergency Medicine

## 2020-07-12 NOTE — Telephone Encounter (Signed)
Called patient informed of Dr. Vanetta Shawl recommendation. Confirmed next appointment with [place]. No further questions.

## 2020-07-12 NOTE — Telephone Encounter (Signed)
Called patient to inform of monitor  results. She says the weekend after she took monitor off she was really light headed and felt like she was going to pass out at a event had to go to the hospital. Head was pounding and burning sensation in chest. Nothing was found wrong at the hospital. She feels fine now. Will inform Dr. Bing Matter of this.

## 2020-07-26 ENCOUNTER — Encounter: Payer: Self-pay | Admitting: Cardiology

## 2020-07-26 ENCOUNTER — Other Ambulatory Visit: Payer: Self-pay

## 2020-07-26 ENCOUNTER — Ambulatory Visit: Payer: PPO | Admitting: Cardiology

## 2020-07-26 ENCOUNTER — Ambulatory Visit (INDEPENDENT_AMBULATORY_CARE_PROVIDER_SITE_OTHER): Payer: PPO

## 2020-07-26 VITALS — BP 136/80 | HR 86 | Ht 66.0 in | Wt 258.0 lb

## 2020-07-26 DIAGNOSIS — R072 Precordial pain: Secondary | ICD-10-CM | POA: Diagnosis not present

## 2020-07-26 DIAGNOSIS — R002 Palpitations: Secondary | ICD-10-CM

## 2020-07-26 DIAGNOSIS — I2782 Chronic pulmonary embolism: Secondary | ICD-10-CM

## 2020-07-26 DIAGNOSIS — R55 Syncope and collapse: Secondary | ICD-10-CM

## 2020-07-26 DIAGNOSIS — R0609 Other forms of dyspnea: Secondary | ICD-10-CM

## 2020-07-26 DIAGNOSIS — R0600 Dyspnea, unspecified: Secondary | ICD-10-CM | POA: Diagnosis not present

## 2020-07-26 NOTE — Progress Notes (Signed)
Cardiology Office Note:    Date:  07/26/2020   ID:  Gail Olson, DOB Oct 26, 1950, MRN [id number]  PCP:  Shellia Cleverly, PA  Cardiologist:  Gypsy Balsam, MD    Referring MD: Shellia Cleverly, [place]   Chief Complaint  Patient presents with  . Dizziness  . Fatigue  . Headache    History of Present Illness:    Gail Olson is a 70 y.o. female with past medical history significant for DVT, PE she is anticoagulated, essential hypertension, obstructive sleep apnea, sinus tachycardia she has been struggling for the last few months with episode of dizziness those episodes can last for up sometimes even hours it is associated many times with headache.  She did have some episode of near syncope.  She ended up having few visits in the emergency room every single time that visit is unrevealing.  Recently even CT of her chest was done to look for new pulmonary emboli that was negative.  I did put Zio patch on her which was unrevealing.  However, today she tells me when she went that patch she did not have any episode of syncope or near syncope.  She is still very frustrated by that.  Still this problem is unresolved.  Past Medical History:  Diagnosis Date  . Age-related osteoporosis without current pathological fracture 12/16/2013   Formatting of this note might be different from the original. Overview:  Due to history of fracture with the background low bone mass (osteopenia)  . Arthritis   . Atypical chest pain 11/01/2015  . Chest pain 03/18/2017  . Chronic deep vein thrombosis (DVT) of proximal vein of left lower extremity (HCC) 03/16/2019  . DVT (deep venous thrombosis) (HCC)   . Dyspnea on exertion 08/01/2015  . Elevated blood-pressure reading without diagnosis of hypertension 05/24/2014   Overview:  IMOUPDATE  . Gastroesophageal reflux disease without esophagitis 05/24/2014   Overview:  Stable on Prilosec. Overview:  Overview:  Stable on Prilosec.  Formatting of this note might be  different from the original. Stable on Prilosec. Formatting of this note might be different from the original. Overview:  Stable on Prilosec.  Overview:  Overview:  Stable on Prilosec. Overview:  Overview:  Stable on Prilosec.  Marland Kitchen History of pulmonary embolism 07/30/2018  . Hypothyroidism 02/26/2017  . Lichen sclerosus et atrophicus 12/16/2013  . Nodule of right lung 10/25/2015   Overview:  Per cxr done at [facility] on Aug 24/2017  RUL  Overview:  Overview:  Per cxr done at [facility] on Aug 24/2017  RUL   Formatting of this note might be different from the original. Per cxr done at [facility] on Aug 24/2017  RUL Formatting of this note might be different from the original. Overview:  Per cxr done at [facility] on Aug 24/2017  RUL   Overview:  Overview:  Per cxr done at [facility] on Aug 24/2017  RUL  Overview:  . Obesity (BMI 30-39.9) 04/20/2018  . OSA (obstructive sleep apnea) 05/06/2017   ONO with RA 04/27/17 >> test time 8 hrs 21 min.  Basal SpO2 92%, low SpO2 83%.  Spent 18 min with SpO2 < 88%. ONO shows intermittent oxygen desaturation, and pattern is concerning for sleep apnea.   . Palpitations 11/01/2015  . Precordial chest pain 10/07/2017  . Primary hypercoagulable state (HCC) 03/16/2019  . Pulmonary artery hypertension (HCC) 05/21/2017  . Pulmonary embolism (HCC)   . Simple chronic bronchitis (HCC) 05/06/2017  . Sinus tachycardia 08/01/2015  . Thyroid  disease   . Vitamin D deficiency 12/16/2013    Past Surgical History:  Procedure Laterality Date  . APPENDECTOMY    . BACK SURGERY    . CHOLECYSTECTOMY    . OVARY SURGERY      Current Medications: Current Meds  Medication Sig  . acetaminophen (TYLENOL) 650 MG CR tablet Take 650 mg by mouth every 8 (eight) hours as needed for pain.  Marland Kitchen albuterol (PROAIR HFA) 108 (90 Base) MCG/ACT inhaler Inhale 2 puffs into the lungs every 6 (six) hours as needed for wheezing or shortness of breath.  Marland Kitchen ascorbic acid (VITAMIN C) 500 MG tablet Take 500 mg by mouth daily.  .  clotrimazole-betamethasone (LOTRISONE) cream Apply 1 application topically in the morning and at bedtime. Unknown strength  . ELIQUIS 5 MG TABS tablet TAKE 1 TABLET BY MOUTH TWICE A DAY (Patient taking differently: Take 5 mg by mouth 2 (two) times daily.)  . fluticasone (FLONASE) 50 MCG/ACT nasal spray PLACE 2 SPRAYS INTO BOTH NOSTRILS AT BEDTIME AS NEEDED FOR ALLERGIES OR RHINITIS. (Patient taking differently: Place 2 sprays into both nostrils 2 (two) times daily.)  . fluticasone (FLOVENT HFA) 110 MCG/ACT inhaler Inhale 2 puffs into the lungs 2 (two) times daily as needed (Cough, wheeze, chest congestion, or shortness of breath).  . hydrochlorothiazide (MICROZIDE) 12.5 MG capsule Take 12.5 mg by mouth daily as needed for edema or fluid.  Marland Kitchen HYDROcodone-acetaminophen (NORCO/VICODIN) 5-325 MG tablet Take 1 tablet by mouth every 8 (eight) hours as needed for pain.  . Lactobacillus (PROBIOTIC ACIDOPHILUS PO) Take 1 capsule by mouth daily. Unknown strenght  . levothyroxine (SYNTHROID, LEVOTHROID) 50 MCG tablet Take 50 mcg by mouth daily.   Marland Kitchen loratadine (CLARITIN) 10 MG tablet Take 1 tablet (10 mg total) by mouth daily. (Patient taking differently: Take 10 mg by mouth 2 (two) times daily.)  . meclizine (ANTIVERT) 25 MG tablet Take 25 mg by mouth 3 (three) times daily as needed for dizziness.  . methocarbamol (ROBAXIN) 750 MG tablet Take 750 mg by mouth 3 (three) times daily as needed for muscle spasms.   . Metoprolol Succinate 50 MG CS24 Take 1 tablet by mouth daily.  . montelukast (SINGULAIR) 10 MG tablet Take 1 tablet (10 mg total) by mouth at bedtime. (Patient taking differently: Take 10 mg by mouth as needed (Allergies).)  . omeprazole (PRILOSEC) 20 MG capsule Take 20 mg by mouth daily as needed (for reflux).   Marland Kitchen sulfamethoxazole-trimethoprim (BACTRIM DS) 800-160 MG tablet Take 1 tablet by mouth as needed (UTI).  . Vitamin D, Ergocalciferol, (DRISDOL) 50000 units CAPS capsule Take 50,000 Units by mouth  once a week.     Allergies:   Nsaids, Other, Prednisone, Risedronate, Adhesive [tape], Codeine, and Penicillins   Social History   Socioeconomic History  . Marital status: Married    Spouse name: Not on file  . Number of children: Not on file  . Years of education: Not on file  . Highest education level: Not on file  Occupational History  . Not on file  Tobacco Use  . Smoking status: Never Smoker  . Smokeless tobacco: Never Used  Vaping Use  . Vaping Use: Never used  Substance and Sexual Activity  . Alcohol use: No  . Drug use: No  . Sexual activity: Not on file  Other Topics Concern  . Not on file  Social History Narrative  . Not on file   Social Determinants of Health   Financial Resource Strain: Not on  file  Food Insecurity: Not on file  Transportation Needs: Not on file  Physical Activity: Not on file  Stress: Not on file  Social Connections: Not on file     Family History: The patient's family history includes Cancer in her mother; Colon cancer in her brother; Hypertension in her brother, mother, and sister. There is no history of Pulmonary disease. ROS:   Please see the history of present illness.    All 14 point review of systems negative except as described per history of present illness  EKGs/Labs/Other Studies Reviewed:      Recent Labs: 06/07/2020: ALT 20 06/30/2020: BUN 14; Creatinine, Ser 0.74; Hemoglobin 13.6; Platelets 217; Potassium 3.6; Sodium 137  Recent Lipid Panel No results found for: CHOL, TRIG, HDL, CHOLHDL, VLDL, LDLCALC, LDLDIRECT  Physical Exam:    VS:  BP 136/80 (BP Location: Right Arm, Patient Position: Sitting)   Pulse 86   Ht 5\' 6"  (1.676 m)   Wt 258 lb (117 kg)   SpO2 96%   BMI 41.64 kg/m     Wt Readings from Last 3 Encounters:  07/26/20 258 lb (117 kg)  06/30/20 250 lb (113.4 kg)  06/20/20 255 lb (115.7 kg)     GEN:  Well nourished, well developed in no acute distress HEENT: Normal NECK: No JVD; No carotid  bruits LYMPHATICS: No lymphadenopathy CARDIAC: RRR, no murmurs, no rubs, no gallops RESPIRATORY:  Clear to auscultation without rales, wheezing or rhonchi  ABDOMEN: Soft, non-tender, non-distended MUSCULOSKELETAL:  No edema; No deformity  SKIN: Warm and dry LOWER EXTREMITIES: no swelling NEUROLOGIC:  Alert and oriented x 3 PSYCHIATRIC:  Normal affect   ASSESSMENT:    1. Other chronic pulmonary embolism without acute cor pulmonale (HCC)   2. Palpitations   3. Precordial chest pain   4. Syncope and collapse   5. Dyspnea on exertion    PLAN:    In order of problems listed above:  1. History of pulmonary emboli she is anticoagulated which I will continue. 2. Syncope with near syncope.  I will ask her to wear another Zio patch for a week hopefully at this time she will have symptoms when she went and will be able to determine what the problem is.  I will also ask her to get blood pressure monitor and check her blood pressure and the situation she develops episodes of weakness and fatigue that can last for relatively long time.  I did review record from the emergency room when she was there last time which again was unrevealing. 3. Dyspnea exertion stable and doing quite reasonably from that point review. 4. Precordial chest pain, denies having any   Medication Adjustments/Labs and Tests Ordered: Current medicines are reviewed at length with the patient today.  Concerns regarding medicines are outlined above.  No orders of the defined types were placed in this encounter.  Medication changes: No orders of the defined types were placed in this encounter.   Signed, 06/22/20, MD, [facility] 07/26/2020 4:34 PM    [facility]

## 2020-07-26 NOTE — Patient Instructions (Signed)
Medication Instructions:  Your physician recommends that you continue on your current medications as directed. Please refer to the Current Medication list given to you today.  *If you need a refill on your cardiac medications before your next appointment, please call your pharmacy*   Lab Work: None. If you have labs (blood work) drawn today and your tests are completely normal, you will receive your results only by: . MyChart Message (if you have MyChart) OR . A paper copy in the mail If you have any lab test that is abnormal or we need to change your treatment, we will call you to review the results.   Testing/Procedures: A zio monitor was ordered today. It will remain on for 7 days. You will then return monitor and event diary in provided box. It takes 1-2 weeks for report to be downloaded and returned to us. We will call you with the results. If monitor falls off or has orange flashing light, please call Zio for further instructions.      Follow-Up: At [facility], you and your health needs are our priority.  As part of our continuing mission to provide you with exceptional heart care, we have created designated Provider Care Teams.  These Care Teams include your primary Cardiologist (physician) and Advanced Practice Providers (APPs -  Physician Assistants and Nurse Practitioners) who all work together to provide you with the care you need, when you need it.  We recommend signing up for the patient portal called "MyChart".  Sign up information is provided on this After Visit Summary.  MyChart is used to connect with patients for Virtual Visits (Telemedicine).  Patients are able to view lab/test results, encounter notes, upcoming appointments, etc.  Non-urgent messages can be sent to your provider as well.   To learn more about what you can do with MyChart, go to [email or web address].    Your next appointment:   2 month(s)  The format for your next appointment:   In  Person  Provider:   Robert Krasowski, MD   Other Instructions   

## 2020-08-03 ENCOUNTER — Telehealth: Payer: Self-pay | Admitting: Cardiology

## 2020-08-03 NOTE — Telephone Encounter (Signed)
Patient is calling regarding her heart monitor. She states she was initially advised she would only have to wear the monitor for 1 week, beginning on 07/26/20. Soon after, she was told and confirmed with MD that she was to wear the monitor for 14 days. However, she states yesterday she was contacted by the company that distributes the monitors. They were inquiring whether or not she mailed the monitor back yet and they informed her that the monitoring cycle is complete. She would like a call back to confirm if this is true as yesterday would've only been 7 days. She states she will not remove her monitor until she hears back from our office.

## 2020-08-06 NOTE — Telephone Encounter (Signed)
Patient reports she was told in the office to wear for 14 days. She wore one for 7 days prior and took off then had a episode 2 days later. So for this one she would really like to wear for 14 days as Dr. Bing Matter discussed in the office with her. I advised her to just wear for 14 days then I will let company know.

## 2020-08-15 DIAGNOSIS — R002 Palpitations: Secondary | ICD-10-CM | POA: Diagnosis not present

## 2020-08-15 DIAGNOSIS — R55 Syncope and collapse: Secondary | ICD-10-CM | POA: Diagnosis not present

## 2020-08-22 ENCOUNTER — Telehealth: Payer: Self-pay

## 2020-08-22 DIAGNOSIS — R002 Palpitations: Secondary | ICD-10-CM

## 2020-08-22 DIAGNOSIS — I472 Ventricular tachycardia, unspecified: Secondary | ICD-10-CM

## 2020-08-22 NOTE — Telephone Encounter (Signed)
-----   Message from Georgeanna Lea, MD sent at 08/17/2020 12:57 PM EDT ----- Monitor showed ventricular tachycardia.  Please refer patient to EP for evaluation

## 2020-08-22 NOTE — Telephone Encounter (Signed)
Patient notified of results and recommendations and agrees with plan. Referral sent to [facility] team. Patient aware

## 2020-08-28 ENCOUNTER — Other Ambulatory Visit: Payer: Self-pay

## 2020-08-28 ENCOUNTER — Encounter: Payer: Self-pay | Admitting: Gastroenterology

## 2020-08-28 ENCOUNTER — Ambulatory Visit (INDEPENDENT_AMBULATORY_CARE_PROVIDER_SITE_OTHER): Payer: PPO | Admitting: Gastroenterology

## 2020-08-28 VITALS — BP 130/70 | HR 90 | Ht 66.0 in

## 2020-08-28 DIAGNOSIS — R69 Illness, unspecified: Secondary | ICD-10-CM

## 2020-08-28 DIAGNOSIS — K449 Diaphragmatic hernia without obstruction or gangrene: Secondary | ICD-10-CM

## 2020-08-28 DIAGNOSIS — K219 Gastro-esophageal reflux disease without esophagitis: Secondary | ICD-10-CM | POA: Diagnosis not present

## 2020-08-28 MED ORDER — PANTOPRAZOLE SODIUM 40 MG PO TBEC: 40.0000 mg | DELAYED_RELEASE_TABLET | Freq: Every day | ORAL | 3 refills | Status: DC

## 2020-08-28 NOTE — Progress Notes (Signed)
Chief Complaint: To get established  Referring Provider:  Shellia Cleverly, PA      ASSESSMENT AND PLAN;   #1. GERD with small HH, despite omeprazole  #2. H/O polyps (tubular adenoma) 01/2006  #3. FH colon ca (brother at age 72)  #4. Multiple co-morbid conditions DVT, PE on Eliquis, HTN, OSA on CPAP and now with episodic ventricular tachycardia.  Plan: -Change omeprazole to Protonix 40mg  po QD #30, 11 refills -Pt recently heart mointor showing V tachycardia.  She is currently undergoing cardiac evaluation. -FU in 3 months. At FU, needs EGD/colon after cardio clearence and off eliquis x 1 day.  I have discussed extensively with the pt who agrees with the plan.  Of note patient's daughter is RN with hospice who is also aware of the above plan.   HPI:    Gail Olson is a 70 y.o. female  With multiple medical problems including DVT, PE on Eliquis, HTN, OSA on CPAP With syncopal episodes since June 2021 (2 distinct episodes) Found to have ventricular tachycardia on heart monitor and is being referred to EP for further evaluation  Here to get established.  C/O longstanding gastroesophageal reflux.  EGD by Dr. July 2021 in 2007 showed small hiatal hernia.  She has been on omeprazole ever since.  Most recently pharmacy changed it " to some other form of omeprazole" and it stopped working.  No odynophagia or dysphagia.  She has been advised EGD.  C/O generalized abdominal pain with longstanding history of constipation.  Diagnosed with IBS-C.  Currently having bowel movements 1 and 2 to 3 days with pellet-like stools.  No melena or hematochezia.  Underwent colonoscopy by Dr. 2008 in 2007 which showed a small tubular adenoma.  She was advised to get repeat colonoscopy in 5 years.  She went to see Dr. 2008 in 2011 and was advised to get colonoscopy performed.  However, due to her other medical problems at that time it was postponed.  C/O chronic gen abdo pain with abdominal bloating  for many years.  Her brother has recently been diagnosed as having colon cancer at age 34.  She already had this appointment prior to heart monitor testing.  Past GI procedures: EGD 02/27/2006 Dr. 03/01/2006: Normal except for small hiatal hernia. Colonoscopy February 27, 2006 by Dr. March 01, 2006: 3 mm polyp s/p polypectomy at hepatic flexure.  Multiple sigmoid diverticulosis.  Biopsies tubular adenoma.  Repeat in 5 years.   Past Medical History:  Diagnosis Date   Age-related osteoporosis without current pathological fracture 12/16/2013   Formatting of this note might be different from the original. Overview:  Due to history of fracture with the background low bone mass (osteopenia)   Arthritis    Atypical chest pain 11/01/2015   Chest pain 03/18/2017   Chronic deep vein thrombosis (DVT) of proximal vein of left lower extremity (HCC) 03/16/2019   DVT (deep venous thrombosis) (HCC)    Dyspnea on exertion 08/01/2015   Elevated blood-pressure reading without diagnosis of hypertension 05/24/2014   Overview:  IMOUPDATE   Gastroesophageal reflux disease without esophagitis 05/24/2014   Overview:  Stable on Prilosec. Overview:  Overview:  Stable on Prilosec.  Formatting of this note might be different from the original. Stable on Prilosec. Formatting of this note might be different from the original. Overview:  Stable on Prilosec.  Overview:  Overview:  Stable on Prilosec. Overview:  Overview:  Stable on Prilosec.   History of pulmonary embolism 07/30/2018   Hypothyroidism 02/26/2017  Lichen sclerosus et atrophicus 12/16/2013   Nodule of right lung 10/25/2015   Overview:  Per cxr done at [facility] on Aug 24/2017  RUL  Overview:  Overview:  Per cxr done at [facility] on Aug 24/2017  RUL   Formatting of this note might be different from the original. Per cxr done at [facility] on Aug 24/2017  RUL Formatting of this note might be different from the original. Overview:  Per cxr done at [facility] on Aug 24/2017  RUL   Overview:  Overview:  Per  cxr done at [facility] on Aug 24/2017  RUL  Overview:   Obesity (BMI 30-39.9) 04/20/2018   OSA (obstructive sleep apnea) 05/06/2017   ONO with RA 04/27/17 >> test time 8 hrs 21 min.  Basal SpO2 92%, low SpO2 83%.  Spent 18 min with SpO2 < 88%.  ONO shows intermittent oxygen desaturation, and pattern is concerning for sleep apnea.    Palpitations 11/01/2015   Precordial chest pain 10/07/2017   Primary hypercoagulable state (HCC) 03/16/2019   Pulmonary artery hypertension (HCC) 05/21/2017   Pulmonary embolism (HCC)    Simple chronic bronchitis (HCC) 05/06/2017   Sinus tachycardia 08/01/2015   Thyroid disease    Vitamin D deficiency 12/16/2013    Past Surgical History:  Procedure Laterality Date   APPENDECTOMY     BACK SURGERY     CHOLECYSTECTOMY     COLONOSCOPY  02/27/2006   Dr Rayfield Citizen  Polyp in the hepatic flexure (polypectomy) Diverticulosis of the sigmoid colon.   ESOPHAGOGASTRODUODENOSCOPY  02/27/2006   Dr Rayfield Citizen  Normal EGD to descending duodenum.    OVARY SURGERY      Family History  Problem Relation Age of Onset   Hypertension Mother    Cancer Mother    Hypertension Sister    Hypertension Brother    Colon cancer Brother    Pulmonary disease Neg Hx    Pancreatic cancer Neg Hx    Stomach cancer Neg Hx    Esophageal cancer Neg Hx    Liver disease Neg Hx     Social History   Tobacco Use   Smoking status: Never   Smokeless tobacco: Never  Vaping Use   Vaping Use: Never used  Substance Use Topics   Alcohol use: No   Drug use: No    Current Outpatient Medications  Medication Sig Dispense Refill   acetaminophen (TYLENOL) 650 MG CR tablet Take 650 mg by mouth every 8 (eight) hours as needed for pain.     albuterol (PROAIR HFA) 108 (90 Base) MCG/ACT inhaler Inhale 2 puffs into the lungs every 6 (six) hours as needed for wheezing or shortness of breath. 1 each 5   ascorbic acid (VITAMIN C) 500 MG tablet Take 500 mg by mouth daily.     betamethasone dipropionate (DIPROLENE) 0.05 %  ointment Apply 1 application topically 2 (two) times daily as needed (rash).  3   clotrimazole-betamethasone (LOTRISONE) cream Apply 1 application topically in the morning and at bedtime. Unknown strength     ELIQUIS 5 MG TABS tablet TAKE 1 TABLET BY MOUTH TWICE A DAY (Patient taking differently: Take 5 mg by mouth 2 (two) times daily.) 180 tablet 1   fluticasone (FLONASE) 50 MCG/ACT nasal spray PLACE 2 SPRAYS INTO BOTH NOSTRILS AT BEDTIME AS NEEDED FOR ALLERGIES OR RHINITIS. (Patient taking differently: Place 2 sprays into both nostrils 2 (two) times daily.) 16 g 2   fluticasone (FLOVENT HFA) 110 MCG/ACT inhaler Inhale 2 puffs into the lungs  2 (two) times daily as needed (Cough, wheeze, chest congestion, or shortness of breath). 1 Inhaler 12   hydrochlorothiazide (MICROZIDE) 12.5 MG capsule Take 12.5 mg by mouth daily as needed for edema or fluid.  2   HYDROcodone-acetaminophen (NORCO/VICODIN) 5-325 MG tablet Take 1 tablet by mouth every 8 (eight) hours as needed for pain.  0   Lactobacillus (PROBIOTIC ACIDOPHILUS PO) Take 1 capsule by mouth daily. Unknown strenght     levothyroxine (SYNTHROID, LEVOTHROID) 50 MCG tablet Take 50 mcg by mouth daily.      loratadine (CLARITIN) 10 MG tablet Take 1 tablet (10 mg total) by mouth daily. (Patient taking differently: Take 10 mg by mouth 2 (two) times daily.) 30 tablet 11   meclizine (ANTIVERT) 25 MG tablet Take 25 mg by mouth 3 (three) times daily as needed for dizziness.     methocarbamol (ROBAXIN) 750 MG tablet Take 750 mg by mouth 3 (three) times daily as needed for muscle spasms.      Metoprolol Succinate 50 MG CS24 Take 1 tablet by mouth daily.     montelukast (SINGULAIR) 10 MG tablet Take 1 tablet (10 mg total) by mouth at bedtime. (Patient taking differently: Take 10 mg by mouth as needed (Allergies).) 30 tablet 5   Omega-3 Fatty Acids (FISH OIL) 500 MG CAPS Take 1 capsule by mouth daily. Mega Red     omeprazole (PRILOSEC) 20 MG capsule Take 20 mg by  mouth daily as needed (for reflux).   0   Vitamin D, Ergocalciferol, (DRISDOL) 50000 units CAPS capsule Take 50,000 Units by mouth once a week.     No current facility-administered medications for this visit.    Allergies  Allergen Reactions   Nsaids Other (See Comments)    Not suppose to take with her blood thinner   Other Other (See Comments)    Extreme stomach pain if any of these are eaten: spicy foods, onions, fried foods, cauliflower, and broccoli   Prednisone Other (See Comments)    Not suppose to take due to advanced osteoporosis   Risedronate Other (See Comments)    (Actonel) Caused EXTREME JAW PAIN and muscles and joints ached    Adhesive [Tape] Rash and Other (See Comments)    EKG pads left welts on the skin   Codeine Palpitations and Other (See Comments)    Tachycardia    Penicillins Rash    Has patient had a PCN reaction causing immediate rash, facial/tongue/throat swelling, SOB or lightheadedness with hypotension: Yes Has patient had a PCN reaction causing severe rash involving mucus membranes or skin necrosis: Unk Has patient had a PCN reaction that required hospitalization: Unk Has patient had a PCN reaction occurring within the last 10 years: Unk If all of the above answers are "NO", then may proceed with Cephalosporin use.     Review of Systems:  Constitutional: Denies fever, chills, diaphoresis, appetite change and has fatigue.  HEENT: Denies photophobia, eye pain, redness, hearing loss, ear pain, congestion, sore throat, rhinorrhea, sneezing, mouth sores, neck pain, neck stiffness and tinnitus.   Respiratory: Denies SOB, DOE, cough, chest tightness,  and wheezing.   Cardiovascular: Denies chest pain, has palpitations and leg swelling.  Genitourinary: Denies dysuria, urgency, frequency, hematuria, flank pain and difficulty urinating.  Musculoskeletal: Has myalgias, back pain, joint swelling, arthralgias and gait problem.  Skin: No rash.  Neurological: Denies  dizziness, seizures, syncope, weakness, light-headedness, numbness and headaches.  Hematological: Denies adenopathy. Easy bruising, personal or family bleeding history  Psychiatric/Behavioral:  Has anxiety or depression     Physical Exam:    BP 130/70   Pulse 90   Ht 5\' 6"  (1.676 m)   SpO2 96%   BMI 41.64 kg/m  Wt Readings from Last 3 Encounters:  07/26/20 258 lb (117 kg)  06/30/20 250 lb (113.4 kg)  06/20/20 255 lb (115.7 kg)   Constitutional:  Well-developed, in no acute distress. Psychiatric: Normal mood and affect. Behavior is normal. HEENT: Pupils normal.  Conjunctivae are normal. No scleral icterus. Neck supple.  Cardiovascular: Normal rate, regular rhythm. No edema Pulmonary/chest: Effort normal and breath sounds normal. No wheezing, rales or rhonchi. Abdominal: Soft, nondistended. Nontender. Bowel sounds active throughout. There are no masses palpable. No hepatomegaly. Rectal: Deferred Neurological: Alert and oriented to person place and time. Skin: Skin is warm and dry. No rashes noted.  Data Reviewed: I have personally reviewed following labs and imaging studies  CBC: CBC Latest Ref Rng & Units 06/30/2020 06/07/2020 08/25/2019  WBC 4.0 - 10.5 K/uL 7.6 7.5 8.5  Hemoglobin 12.0 - 15.0 g/dL 08/27/2019 96.7 59.1  Hematocrit 36.0 - 46.0 % 41.5 41.4 41.4  Platelets 150 - 400 K/uL 217 238 232    CMP: CMP Latest Ref Rng & Units 06/30/2020 06/07/2020 08/25/2019  Glucose 70 - 99 mg/dL 08/27/2019) 466(Z) 993(T)  BUN 8 - 23 mg/dL 14 15 12   Creatinine 0.44 - 1.00 mg/dL 701(X 7.93  Sodium 135 - 145 mmol/L 137 137 140  Potassium 3.5 - 5.1 mmol/L 3.6 3.9 4.1  Chloride 98 - 111 mmol/L 107 105 106  CO2 22 - 32 mmol/L 22 25 25   Calcium 8.9 - 10.3 mg/dL 9.03) 0.09) )  Total Protein 6.5 - 8.1 g/dL - 7.0 -  Total Bilirubin 0.3 - 1.2 mg/dL - 2.3(R) -  Alkaline Phos 38 - 126 U/L - 53 -  AST 15 - 41 U/L - 18 -  ALT 0 - 44 U/L - 20 -     Radiology Studies: LONG TERM MONITOR (3-14  DAYS)  Result Date: 08/17/2020 Patch Wear Time:  13 days and 17 hours (2022-05-26T16:40:36-0400 to 2022-06-09T10:06:12-398) Patient had a min HR of 56 bpm, max HR of 231 bpm, and avg HR of 80 bpm. Predominant underlying rhythm was Sinus Rhythm. 1 run of Ventricular Tachycardia occurred lasting 8 beats with a max rate of 231 bpm (avg 178 bpm). 8 Supraventricular Tachycardia runs occurred, the run with the fastest interval lasting 28.7 secs with a max rate of 187 bpm, the longest lasting 41.2 secs with an avg rate of 123 bpm. Isolated SVEs were rare (<1.0%), SVE Couplets were rare (<1.0%), and SVE Triplets were rare (<1.0%).  Isolated VEs were rare (<1.0%), VE Couplets were rare (<1.0%), and no VE Triplets were present. Summary conclusions: 1 episode of ventricular tachycardia total of 8 beats at a maximal rate of 231. 8 episode of supraventricular tachycardia Multiple triggered events showing sinus rhythm     08/19/2020, MD 08/28/2020, 11:17 AM  Cc: 2020-08-11, [address]

## 2020-08-28 NOTE — Patient Instructions (Addendum)
If you are age 72 or older, your body mass index should be between 23-30. Your Body mass index is 41.64 kg/m. If this is out of the aforementioned range listed, please consider follow up with your Primary Care Provider.  If you are age 56 or younger, your body mass index should be between 19-25. Your Body mass index is 41.64 kg/m. If this is out of the aformentioned range listed, please consider follow up with your Primary Care Provider.   __________________________________________________________  The [facility] providers would like to encourage you to use [facility] to communicate with providers for non-urgent requests or questions.  Due to long hold times on the telephone, sending your provider a message by [facility] may be a faster and more efficient way to get a response.  Please allow 48 business hours for a response.  Please remember that this is for non-urgent requests.   We have sent the following medications to your pharmacy for you to pick up at your convenience: Protonix   Follow up in 3 months. Please call to schedule an appointment as time gets closer  Thank you,  Dr. Lynann Bologna

## 2020-09-06 ENCOUNTER — Ambulatory Visit: Payer: PPO | Admitting: Cardiology

## 2020-09-10 ENCOUNTER — Other Ambulatory Visit: Payer: Self-pay

## 2020-09-10 ENCOUNTER — Encounter (HOSPITAL_BASED_OUTPATIENT_CLINIC_OR_DEPARTMENT_OTHER): Payer: Self-pay | Admitting: Emergency Medicine

## 2020-09-10 ENCOUNTER — Emergency Department (HOSPITAL_BASED_OUTPATIENT_CLINIC_OR_DEPARTMENT_OTHER): Payer: PPO

## 2020-09-10 ENCOUNTER — Emergency Department (HOSPITAL_BASED_OUTPATIENT_CLINIC_OR_DEPARTMENT_OTHER)
Admission: EM | Admit: 2020-09-10 | Discharge: 2020-09-10 | Disposition: A | Discharge status: Home / Self Care | Payer: PPO | Attending: Emergency Medicine | Admitting: Emergency Medicine

## 2020-09-10 ENCOUNTER — Telehealth: Payer: Self-pay | Admitting: Cardiology

## 2020-09-10 DIAGNOSIS — R0602 Shortness of breath: Secondary | ICD-10-CM | POA: Insufficient documentation

## 2020-09-10 DIAGNOSIS — Z7901 Long term (current) use of anticoagulants: Secondary | ICD-10-CM | POA: Diagnosis not present

## 2020-09-10 DIAGNOSIS — R531 Weakness: Secondary | ICD-10-CM | POA: Diagnosis not present

## 2020-09-10 DIAGNOSIS — R079 Chest pain, unspecified: Secondary | ICD-10-CM

## 2020-09-10 DIAGNOSIS — R072 Precordial pain: Secondary | ICD-10-CM | POA: Insufficient documentation

## 2020-09-10 DIAGNOSIS — R002 Palpitations: Secondary | ICD-10-CM | POA: Diagnosis not present

## 2020-09-10 DIAGNOSIS — R42 Dizziness and giddiness: Secondary | ICD-10-CM | POA: Insufficient documentation

## 2020-09-10 DIAGNOSIS — Z79899 Other long term (current) drug therapy: Secondary | ICD-10-CM | POA: Diagnosis not present

## 2020-09-10 DIAGNOSIS — E039 Hypothyroidism, unspecified: Secondary | ICD-10-CM | POA: Diagnosis not present

## 2020-09-10 DIAGNOSIS — I10 Essential (primary) hypertension: Secondary | ICD-10-CM | POA: Diagnosis not present

## 2020-09-10 DIAGNOSIS — R55 Syncope and collapse: Secondary | ICD-10-CM | POA: Diagnosis not present

## 2020-09-10 DIAGNOSIS — R0789 Other chest pain: Secondary | ICD-10-CM | POA: Diagnosis not present

## 2020-09-10 LAB — CBC WITH DIFFERENTIAL/PLATELET
Abs Immature Granulocytes: 0.01 10*3/uL (ref 0.00–0.07)
Basophils Absolute: 0.1 10*3/uL (ref 0.0–0.1)
Basophils Relative: 1 %
Eosinophils Absolute: 0.1 10*3/uL (ref 0.0–0.5)
Eosinophils Relative: 2 %
HCT: 42.5 % (ref 36.0–46.0)
Hemoglobin: 14.1 g/dL (ref 12.0–15.0)
Immature Granulocytes: 0 %
Lymphocytes Relative: 27 %
Lymphs Abs: 1.5 10*3/uL (ref 0.7–4.0)
MCH: 29.7 pg (ref 26.0–34.0)
MCHC: 33.2 g/dL (ref 30.0–36.0)
MCV: 89.7 fL (ref 80.0–100.0)
Monocytes Absolute: 0.9 10*3/uL (ref 0.1–1.0)
Monocytes Relative: 15 %
Neutro Abs: 3 10*3/uL (ref 1.7–7.7)
Neutrophils Relative %: 55 %
Platelets: 227 10*3/uL (ref 150–400)
RBC: 4.74 MIL/uL (ref 3.87–5.11)
RDW: 13.1 % (ref 11.5–15.5)
WBC: 5.5 10*3/uL (ref 4.0–10.5)
nRBC: 0 % (ref 0.0–0.2)

## 2020-09-10 LAB — COMPREHENSIVE METABOLIC PANEL
ALT: 23 U/L (ref 0–44)
AST: 19 U/L (ref 15–41)
Albumin: 4 g/dL (ref 3.5–5.0)
Alkaline Phosphatase: 58 U/L (ref 38–126)
Anion gap: 6 (ref 5–15)
BUN: 14 mg/dL (ref 8–23)
CO2: 27 mmol/L (ref 22–32)
Calcium: 9 mg/dL (ref 8.9–10.3)
Chloride: 105 mmol/L (ref 98–111)
Creatinine, Ser: 0.81 mg/dL (ref 0.44–1.00)
GFR, Estimated: >60 mL/min (ref >60)
Glucose, Bld: 109 mg/dL — ABNORMAL HIGH (ref 70–99)
Potassium: 4.1 mmol/L (ref 3.5–5.1)
Sodium: 138 mmol/L (ref 135–145)
Total Bilirubin: 0.6 mg/dL (ref 0.3–1.2)
Total Protein: 7.3 g/dL (ref 6.5–8.1)

## 2020-09-10 LAB — URINALYSIS, ROUTINE W REFLEX MICROSCOPIC
Bilirubin Urine: NEGATIVE
Glucose, UA: NEGATIVE mg/dL
Hgb urine dipstick: NEGATIVE
Ketones, ur: NEGATIVE mg/dL
Leukocytes,Ua: NEGATIVE
Nitrite: NEGATIVE
Protein, ur: NEGATIVE mg/dL
Specific Gravity, Urine: <1.005 — ABNORMAL LOW (ref 1.005–1.030)
pH: 5.5 (ref 5.0–8.0)

## 2020-09-10 LAB — TROPONIN I (HIGH SENSITIVITY)
Troponin I (High Sensitivity): 4 ng/L (ref <18)
Troponin I (High Sensitivity): 5 ng/L (ref <18)

## 2020-09-10 NOTE — Telephone Encounter (Signed)
Pt c/o Shortness Of Breath: STAT if SOB developed within the last 24 hours or pt is noticeably SOB on the phone  1. Are you currently SOB (can you hear that pt is SOB on the phone)? Yes I can hear the patient   2. How long have you been experiencing SOB? Last few days  3. Are you SOB when sitting or when up moving around? Moving around  4. Are you currently experiencing any other symptoms? No patient had to go to the ER this morning.

## 2020-09-10 NOTE — ED Provider Notes (Signed)
[facility] EMERGENCY DEPARTMENT Provider Note   CSN: [id number] Arrival date & time: 09/10/20  1141     History No chief complaint on file.   Gail Olson is a 70 y.o. female with Pmhx DVT/PE on Eliquis, HTN, Hypothyroidism, palpitations who presents to the ED today with complaint of gradual onset, constant, burning, sensation to her substernal area that began around 11:00 AM this morning. Pt also complains of palpitations and shortness of breath with near syncope. She reports hx of same intermittently in the past; has been evaluated by cardiology with zio monitor which showed episodes of vtach and SVT. She has an appointment with electrophysiologist Dr. Elberta Fortisamnitz on August 1 for further evaluation. Pt reports that she had another episode last night that lasted several hours before dissipating. She woke up this morning feeling improved however while at the insurance office began having similar symptoms. Her husband brought her to the ED due to complaint of near syncope and generalized weakness. Pt denies any fevers, chills, nausea, vomiting, abdominal pain, or any other associated symptoms. She has been compliant with her Eliquis and denies missing any doses. She is a non smoker.   Per chart review: Pt seen in the ED on 04/07 for similar complaints with a reassuring work up. Trop negative x 2 and CT Head clear.  Additional ED visit on 04/30 with CTA without new PE.   Zio monitor results: Summary conclusions: 1 episode of ventricular tachycardia total of 8 beats at a maximal rate of 231. 8 episode of supraventricular tachycardia Multiple triggered events showing sinus rhythm  The history is provided by the patient and medical records.      Past Medical History:  Diagnosis Date   Age-related osteoporosis without current pathological fracture 12/16/2013   Formatting of this note might be different from the original. Overview:  Due to history of fracture with the background  low bone mass (osteopenia)   Arthritis    Atypical chest pain 11/01/2015   Chest pain 03/18/2017   Chronic deep vein thrombosis (DVT) of proximal vein of left lower extremity (HCC) 03/16/2019   DVT (deep venous thrombosis) (HCC)    Dyspnea on exertion 08/01/2015   Elevated blood-pressure reading without diagnosis of hypertension 05/24/2014   Overview:  IMOUPDATE   Gastroesophageal reflux disease without esophagitis 05/24/2014   Overview:  Stable on Prilosec. Overview:  Overview:  Stable on Prilosec.  Formatting of this note might be different from the original. Stable on Prilosec. Formatting of this note might be different from the original. Overview:  Stable on Prilosec.  Overview:  Overview:  Stable on Prilosec. Overview:  Overview:  Stable on Prilosec.   History of pulmonary embolism 07/30/2018   Hypothyroidism 02/26/2017   Lichen sclerosus et atrophicus 12/16/2013   Nodule of right lung 10/25/2015   Overview:  [facility]  RUL  Overview:  Overview:  [facility]  RUL   Formatting of this note might be different from the original. [facility]  RUL Formatting of this note might be different from the original. Overview:  [facility]  [facility]   Overview:  Overview[facility]  [facility]  [facility]  Overview:   Obesity (BMI 30-39.9) 04/20/2018   OSA (obstructive sleep apnea) 05/06/2017   ONO with RA 04/27/17 >> test time 8 hrs 21 min.  Basal  SpO2 92%, low SpO2 83%.  Spent 18 min with SpO2 < 88%.  ONO shows intermittent oxygen desaturation, and pattern is concerning for sleep apnea.    Palpitations 11/01/2015   Precordial chest pain 10/07/2017   Primary hypercoagulable state (HCC) 03/16/2019   Pulmonary artery hypertension (HCC) 05/21/2017   Pulmonary embolism (HCC)    Simple chronic bronchitis (HCC) 05/06/2017   Sinus tachycardia 08/01/2015   Thyroid disease    Vitamin D deficiency 12/16/2013    Patient Active Problem  List   Diagnosis Date Noted   Syncope and collapse 06/20/2020   Thyroid disease    DVT (deep venous thrombosis) (HCC)    Arthritis    Primary hypercoagulable state (HCC) 03/16/2019   Chronic deep vein thrombosis (DVT) of proximal vein of left lower extremity (HCC) 03/16/2019   History of pulmonary embolism 07/30/2018   Obesity (BMI 30-39.9) 04/20/2018   Precordial chest pain 10/07/2017   Pulmonary artery hypertension (HCC) 05/21/2017   Simple chronic bronchitis (HCC) 05/06/2017   OSA (obstructive sleep apnea) 05/06/2017   Chest pain 03/18/2017   Pulmonary embolism (HCC) 02/26/2017   Hypothyroidism 02/26/2017   Atypical chest pain 11/01/2015   Palpitations 11/01/2015   Nodule of right lung 10/25/2015   Dyspnea on exertion 08/01/2015   Sinus tachycardia 08/01/2015   Gastroesophageal reflux disease without esophagitis 05/24/2014   Elevated blood-pressure reading without diagnosis of hypertension 05/24/2014   Lichen sclerosus et atrophicus 12/16/2013   Vitamin D deficiency 12/16/2013   Age-related osteoporosis without current pathological fracture 12/16/2013    Past Surgical History:  Procedure Laterality Date   APPENDECTOMY     BACK SURGERY     CHOLECYSTECTOMY     COLONOSCOPY  02/27/2006   Dr Rayfield Citizen  Polyp in the hepatic flexure (polypectomy) Diverticulosis of the sigmoid colon.   ESOPHAGOGASTRODUODENOSCOPY  02/27/2006   Dr Rayfield Citizen  Normal EGD to descending duodenum.    OVARY SURGERY       OB History   No obstetric history on file.     Family History  Problem Relation Age of Onset   Hypertension Mother    Cancer Mother    Hypertension Sister    Hypertension Brother    Colon cancer Brother    Pulmonary disease Neg Hx    Pancreatic cancer Neg Hx    Stomach cancer Neg Hx    Esophageal cancer Neg Hx    Liver disease Neg Hx     Social History   Tobacco Use   Smoking status: Never   Smokeless tobacco: Never  Vaping Use   Vaping Use: Never used  Substance Use  Topics   Alcohol use: No   Drug use: No    Home Medications Prior to Admission medications   Medication Sig Start Date End Date Taking? Authorizing Provider  acetaminophen (TYLENOL) 650 MG CR tablet Take 650 mg by mouth every 8 (eight) hours as needed for pain.    [provider]  albuterol (PROAIR HFA) 108 (90 Base) MCG/ACT inhaler Inhale 2 puffs into the lungs every 6 (six) hours as needed for wheezing or shortness of breath. 01/30/20   Coralyn Helling, MD  ascorbic acid (VITAMIN C) 500 MG tablet Take 500 mg by mouth daily.    [provider]  betamethasone dipropionate (DIPROLENE) 0.05 % ointment Apply 1 application topically 2 (two) times daily as needed (rash). 01/09/17   [provider]  clotrimazole-betamethasone (LOTRISONE) cream Apply 1 application topically in the morning and at bedtime. Unknown strength 03/13/20  [provider]  ELIQUIS 5 MG TABS tablet TAKE 1 TABLET BY MOUTH TWICE A DAY Patient taking differently: Take 5 mg by mouth 2 (two) times daily. 04/26/20   Georgeanna Lea, MD  fluticasone (FLONASE) 50 MCG/ACT nasal spray PLACE 2 SPRAYS INTO BOTH NOSTRILS AT BEDTIME AS NEEDED FOR ALLERGIES OR RHINITIS. Patient taking differently: Place 2 sprays into both nostrils 2 (two) times daily. 07/31/17   Coralyn Helling, MD  fluticasone (FLOVENT HFA) 110 MCG/ACT inhaler Inhale 2 puffs into the lungs 2 (two) times daily as needed (Cough, wheeze, chest congestion, or shortness of breath). 11/20/17   Coralyn Helling, MD  hydrochlorothiazide (MICROZIDE) 12.5 MG capsule Take 12.5 mg by mouth daily as needed for edema or fluid. 03/12/17   [provider]  HYDROcodone-acetaminophen (NORCO/VICODIN) 5-325 MG tablet Take 1 tablet by mouth every 8 (eight) hours as needed for pain. 03/12/17   [provider]  Lactobacillus (PROBIOTIC ACIDOPHILUS PO) Take 1 capsule by mouth daily. Unknown strenght    [provider]  levothyroxine (SYNTHROID,  LEVOTHROID) 50 MCG tablet Take 50 mcg by mouth daily.  10/06/16   [provider]  loratadine (CLARITIN) 10 MG tablet Take 1 tablet (10 mg total) by mouth daily. Patient taking differently: Take 10 mg by mouth 2 (two) times daily. 06/10/17   Bevelyn Ngo, NP  meclizine (ANTIVERT) 25 MG tablet Take 25 mg by mouth 3 (three) times daily as needed for dizziness. 10/05/19   [provider]  methocarbamol (ROBAXIN) 750 MG tablet Take 750 mg by mouth 3 (three) times daily as needed for muscle spasms.  06/27/16   [provider]  Metoprolol Succinate 50 MG CS24 Take 1 tablet by mouth daily.    [provider]  montelukast (SINGULAIR) 10 MG tablet Take 1 tablet (10 mg total) by mouth at bedtime. Patient taking differently: Take 10 mg by mouth as needed (Allergies). 01/30/20   Coralyn Helling, MD  Omega-3 Fatty Acids (FISH OIL) 500 MG CAPS Take 1 capsule by mouth daily. Mega Red    [provider]  omeprazole (PRILOSEC) 20 MG capsule Take 20 mg by mouth daily as needed (for reflux).  02/06/17   [provider]  pantoprazole (PROTONIX) 40 MG tablet Take 1 tablet (40 mg total) by mouth daily. 08/28/20   Lynann Bologna, MD  Vitamin D, Ergocalciferol, (DRISDOL) 50000 units CAPS capsule Take 50,000 Units by mouth once a week. 06/29/17   [provider]    Allergies    Nsaids, Other, Prednisone, Risedronate, Adhesive [tape], Codeine, and Penicillins  Review of Systems   Review of Systems  Constitutional:  Positive for fatigue. Negative for chills and fever.  Respiratory:  Positive for shortness of breath.   Cardiovascular:  Positive for chest pain and palpitations.  Gastrointestinal:  Negative for nausea and vomiting.  Neurological:  Positive for weakness (generalized) and light-headedness. Negative for syncope.  All other systems reviewed and are negative.  Physical Exam Updated Vital Signs BP (!) 142/84 (BP Location: Right Arm)   Pulse 98   Temp  98.3 F (36.8 C) (Oral)   Resp 18   Ht 5\' 6"  (1.676 m)   Wt 116.6 kg   SpO2 100%   BMI 41.48 kg/m   Physical Exam Vitals and nursing note reviewed.  Constitutional:      Appearance: She is not ill-appearing or diaphoretic.     Comments: Tearful on exam  HENT:     Head: Normocephalic and atraumatic.  Eyes:     Conjunctiva/sclera: Conjunctivae normal.  Cardiovascular:     Rate and Rhythm: Normal rate and regular rhythm.     Pulses: Normal pulses.  Pulmonary:     Effort: Pulmonary effort is normal.     Breath sounds: Normal breath sounds. No wheezing, rhonchi or rales.  Abdominal:     Palpations: Abdomen is soft.     Tenderness: There is no abdominal tenderness. There is no guarding or rebound.  Musculoskeletal:     Cervical back: Neck supple.  Skin:    General: Skin is warm and dry.  Neurological:     Mental Status: She is alert.    ED Results / Procedures / Treatments   Labs (all labs ordered are listed, but only abnormal results are displayed) Labs Reviewed  COMPREHENSIVE METABOLIC PANEL - Abnormal; Notable for the following components:      Result Value   Glucose, Bld 109 (*)    All other components within normal limits  URINALYSIS, ROUTINE W REFLEX MICROSCOPIC - Abnormal; Notable for the following components:   Specific Gravity, Urine <1.005 (*)    All other components within normal limits  CBC WITH DIFFERENTIAL/PLATELET  TROPONIN I (HIGH SENSITIVITY)  TROPONIN I (HIGH SENSITIVITY)    EKG EKG Interpretation  Date/Time:  Monday September 10 2020 11:52:59 EDT Ventricular Rate:  99 PR Interval:  142 QRS Duration: 82 QT Interval:  354 QTC Calculation: 454 R Axis:   -42 Text Interpretation: Normal sinus rhythm Left axis deviation Abnormal ECG No significant change since prior 4/22 Confirmed by Meridee Score [phone]) on 09/10/2020 11:57:02 AM  Radiology DG Chest 2 View  Result Date: 09/10/2020 CLINICAL DATA:  Chest pain EXAM: CHEST - 2 VIEW COMPARISON:   06/30/2020 FINDINGS: The heart size and mediastinal contours are within normal limits. Both lungs are clear. Osteopenia. Lower thoracic wedge deformity status post kyphoplasty. IMPRESSION: No acute abnormality of the lungs. Electronically Signed   By: Lauralyn Primes M.D.   On: 09/10/2020 13:12    Procedures Procedures   Medications Ordered in ED Medications - No data to display  ED Course  I have reviewed the triage vital signs and the nursing notes.  Pertinent labs & imaging results that were available during my care of the patient were reviewed by me and considered in my medical decision making (see chart for details).  Clinical Course as of 09/10/20 1556  Mon Sep 10, 2020  [id number] 70 year old female with on and off dizziness lightheadedness.  Has been seeing cardiology and had a recent heart monitor.  Another episode again today.  Currently here her vitals are stable and work-up so far has been unrevealing.  Likely outpatient follow-up with her treating providers if no acute findings. [MB]    Clinical Course User Index [MB] Terrilee Files, MD   MDM Rules/Calculators/A&P                          70 year old female who presents to the ED today with complaint of recurrent episode of chest pain, palpitations, shortness of breath that began around 11 AM this morning.  History of same, has been seeing cardiology and recently wore a ZIO monitor which did show V. tach as well as multiple episodes of SVT.  Is planning to see electrophysiologist in August for same.  On arrival to the ED today patient is afebrile, nontachycardic and nontachypneic and appears to be in no acute distress however is noted to  be tearful on exam.  She does seem like she is tired of her symptoms recurring and wants answers.  Her physical exam is reassuring at this time.  We will plan to work-up for chest pain and palpitations.  Per chart review patient has been seen in the ED for similar episodes in the past, recently on April  30 with repeat CTA which did not show any new PE.  History of same and compliant on Eliquis.  EKG without acute ischemic changes compared to previous on 04/22.   CBC without leukocytosis. Hgb stable at 14.1 CMP with glucose 109. No other electrolyte abnormalities Troponin of 4. Will plan for repeat given symptoms started again this morning.  CXR clear Orthostatics negative  Troponin of 5 U/A without signs of infection  Workup overall reassuring at this time. Will plan to discharge pt home with cardiology follow up. Attending physician Dr. Charm Barges has evaluated patient as well and agrees with plan. Stable for discharge.   This note was prepared using Dragon voice recognition software and may include unintentional dictation errors due to the inherent limitations of voice recognition software.   Final Clinical Impression(s) / ED Diagnoses Final diagnoses:  Chest pain, unspecified type  Palpitations  Near syncope    Rx / DC Orders ED Discharge Orders     None        Discharge Instructions      Your workup was overall reassuring in the ED today. Please follow up with your cardiologist regarding ED visit and for further evaluation.   Return to the ED for any near/worsening symptoms       Tanda Rockers, Cordelia Poche 09/10/20 1556    Terrilee Files, MD 09/10/20 1811

## 2020-09-10 NOTE — ED Triage Notes (Signed)
Pt arrives pov with central CP that started last night. Pt endorses shob, denies radiation of pain. Pt s, tearful in triage, endorses referral by cardiologist to a specialist. Just completed cardiac Holter monitoring

## 2020-09-10 NOTE — Discharge Instructions (Addendum)
Your workup was overall reassuring in the ED today. Please follow up with your cardiologist regarding ED visit and for further evaluation.   Return to the ED for any near/worsening symptoms

## 2020-09-10 NOTE — Telephone Encounter (Signed)
Patient states she was with her husband became lightheaded, short of breath, headache, her heart was pounding, weakness. Patient was referred to Dr. Elberta Fortis and is to see Dr. Ledora Bottcher on the 21st. Appointment canceled, made new appointment for 09/17/2020. She tanked me for helping her out.

## 2020-09-17 ENCOUNTER — Other Ambulatory Visit: Payer: Self-pay | Admitting: Cardiology

## 2020-09-17 ENCOUNTER — Other Ambulatory Visit: Payer: Self-pay

## 2020-09-17 ENCOUNTER — Encounter: Payer: Self-pay | Admitting: Cardiology

## 2020-09-17 ENCOUNTER — Ambulatory Visit: Payer: PPO | Admitting: Cardiology

## 2020-09-17 VITALS — BP 136/88 | HR 71 | Ht 66.0 in | Wt 256.0 lb

## 2020-09-17 DIAGNOSIS — I2721 Secondary pulmonary arterial hypertension: Secondary | ICD-10-CM | POA: Diagnosis not present

## 2020-09-17 DIAGNOSIS — R55 Syncope and collapse: Secondary | ICD-10-CM | POA: Diagnosis not present

## 2020-09-17 DIAGNOSIS — R002 Palpitations: Secondary | ICD-10-CM | POA: Diagnosis not present

## 2020-09-17 DIAGNOSIS — I472 Ventricular tachycardia: Secondary | ICD-10-CM | POA: Insufficient documentation

## 2020-09-17 DIAGNOSIS — Z86711 Personal history of pulmonary embolism: Secondary | ICD-10-CM | POA: Diagnosis not present

## 2020-09-17 DIAGNOSIS — I4729 Other ventricular tachycardia: Secondary | ICD-10-CM | POA: Insufficient documentation

## 2020-09-17 MED ORDER — METOPROLOL SUCCINATE 50 MG PO CS24: 75.0000 mg | EXTENDED_RELEASE_CAPSULE | Freq: Every day | ORAL | 3 refills | Status: DC

## 2020-09-17 NOTE — Progress Notes (Signed)
Cardiology Office Note:    Date:  09/17/2020   ID:  BELLAH Gail, DOB 01/07/1951, MRN [id number]  PCP:  Shellia Cleverly, PA  Cardiologist:  Gypsy Balsam, MD    Referring MD: Shellia Cleverly, [place]   Chief Complaint  Patient presents with   monitor results    History of Present Illness:    Gail Olson is a 70 y.o. female with past medical history significant for DVT, pulmonary emboli, she is anticoagulated, essential hypertension, obstructive sleep apnea, sinus tachycardia.  She has been experiencing strength episodes of dizziness.  We did put monitor on her which show surprisingly no sustained ventricular tachycardia.  Interestingly when she wear monitor she pressed button many times about 2 every single time she pressed the button was sinus rhythm.  May be some anxiety component of this entire scenario.  She described also to have some shortness of breath and fatigue while walking.  Past Medical History:  Diagnosis Date   Age-related osteoporosis without current pathological fracture 12/16/2013   Formatting of this note might be different from the original. Overview:  Due to history of fracture with the background low bone mass (osteopenia)   Arthritis    Atypical chest pain 11/01/2015   Chest pain 03/18/2017   Chronic deep vein thrombosis (DVT) of proximal vein of left lower extremity (HCC) 03/16/2019   DVT (deep venous thrombosis) (HCC)    Dyspnea on exertion 08/01/2015   Elevated blood-pressure reading without diagnosis of hypertension 05/24/2014   Overview:  IMOUPDATE   Gastroesophageal reflux disease without esophagitis 05/24/2014   Overview:  Stable on Prilosec. Overview:  Overview:  Stable on Prilosec.  Formatting of this note might be different from the original. Stable on Prilosec. Formatting of this note might be different from the original. Overview:  Stable on Prilosec.  Overview:  Overview:  Stable on Prilosec. Overview:  Overview:  Stable on Prilosec.   History of  pulmonary embolism 07/30/2018   Hypothyroidism 02/26/2017   Lichen sclerosus et atrophicus 12/16/2013   Nodule of right lung 10/25/2015   Overview:  Per cxr done at [facility] on Aug 24/2017  RUL  Overview:  Overview:  Per cxr done at [facility] on Aug 24/2017  RUL   Formatting of this note might be different from the original. Per cxr done at [facility] on Aug 24/2017  RUL Formatting of this note might be different from the original. Overview:  Per cxr done at [facility] on Aug 24/2017  RUL   Overview:  Overview:  Per cxr done at [facility] on Aug 24/2017  RUL  Overview:   Obesity (BMI 30-39.9) 04/20/2018   OSA (obstructive sleep apnea) 05/06/2017   ONO with RA 04/27/17 >> test time 8 hrs 21 min.  Basal SpO2 92%, low SpO2 83%.  Spent 18 min with SpO2 < 88%.  ONO shows intermittent oxygen desaturation, and pattern is concerning for sleep apnea.    Palpitations 11/01/2015   Precordial chest pain 10/07/2017   Primary hypercoagulable state (HCC) 03/16/2019   Pulmonary artery hypertension (HCC) 05/21/2017   Pulmonary embolism (HCC)    Simple chronic bronchitis (HCC) 05/06/2017   Sinus tachycardia 08/01/2015   Thyroid disease    Vitamin D deficiency 12/16/2013    Past Surgical History:  Procedure Laterality Date   APPENDECTOMY     BACK SURGERY     CHOLECYSTECTOMY     COLONOSCOPY  02/27/2006   Dr Rayfield Citizen  Polyp in the hepatic flexure (polypectomy) Diverticulosis of the sigmoid  colon.   ESOPHAGOGASTRODUODENOSCOPY  02/27/2006   Dr Rayfield Citizen  Normal EGD to descending duodenum.    OVARY SURGERY      Current Medications: Current Meds  Medication Sig   acetaminophen (TYLENOL) 650 MG CR tablet Take 650 mg by mouth every 8 (eight) hours as needed for pain.   albuterol (PROAIR HFA) 108 (90 Base) MCG/ACT inhaler Inhale 2 puffs into the lungs every 6 (six) hours as needed for wheezing or shortness of breath.   ascorbic acid (VITAMIN C) 500 MG tablet Take 500 mg by mouth daily.   betamethasone dipropionate (DIPROLENE) 0.05 % ointment Apply 1 application  topically 2 (two) times daily as needed (rash).   clotrimazole-betamethasone (LOTRISONE) cream Apply 1 application topically in the morning and at bedtime. Unknown strength   ELIQUIS 5 MG TABS tablet TAKE 1 TABLET BY MOUTH TWICE A DAY (Patient taking differently: Take 5 mg by mouth 2 (two) times daily.)   fluticasone (FLONASE) 50 MCG/ACT nasal spray PLACE 2 SPRAYS INTO BOTH NOSTRILS AT BEDTIME AS NEEDED FOR ALLERGIES OR RHINITIS. (Patient taking differently: Place 2 sprays into both nostrils 2 (two) times daily.)   fluticasone (FLOVENT HFA) 110 MCG/ACT inhaler Inhale 2 puffs into the lungs 2 (two) times daily as needed (Cough, wheeze, chest congestion, or shortness of breath).   hydrochlorothiazide (MICROZIDE) 12.5 MG capsule Take 12.5 mg by mouth daily as needed for edema or fluid.   HYDROcodone-acetaminophen (NORCO/VICODIN) 5-325 MG tablet Take 1 tablet by mouth every 8 (eight) hours as needed for pain.   Lactobacillus (PROBIOTIC ACIDOPHILUS PO) Take 1 capsule by mouth daily. Unknown strenght   levothyroxine (SYNTHROID, LEVOTHROID) 50 MCG tablet Take 50 mcg by mouth daily.    loratadine (CLARITIN) 10 MG tablet Take 1 tablet (10 mg total) by mouth daily. (Patient taking differently: Take 10 mg by mouth 2 (two) times daily.)   meclizine (ANTIVERT) 25 MG tablet Take 25 mg by mouth 3 (three) times daily as needed for dizziness.   methocarbamol (ROBAXIN) 750 MG tablet Take 750 mg by mouth 3 (three) times daily as needed for muscle spasms.    Metoprolol Succinate 50 MG CS24 Take 1 tablet by mouth daily.   montelukast (SINGULAIR) 10 MG tablet Take 1 tablet (10 mg total) by mouth at bedtime. (Patient taking differently: Take 10 mg by mouth as needed (Allergies).)   Omega-3 Fatty Acids (FISH OIL) 500 MG CAPS Take 1 capsule by mouth daily. Mega Red   omeprazole (PRILOSEC) 20 MG capsule Take 20 mg by mouth daily as needed (for reflux).    pantoprazole (PROTONIX) 40 MG tablet Take 1 tablet (40 mg total) by  mouth daily.   Vitamin D, Ergocalciferol, (DRISDOL) 50000 units CAPS capsule Take 50,000 Units by mouth once a week.     Allergies:   Nsaids, Other, Prednisone, Risedronate, Adhesive [tape], Codeine, and Penicillins   Social History   Socioeconomic History   Marital status: Married    Spouse name: Not on file   Number of children: 3   Years of education: Not on file   Highest education level: Not on file  Occupational History   Not on file  Tobacco Use   Smoking status: Never   Smokeless tobacco: Never  Vaping Use   Vaping Use: Never used  Substance and Sexual Activity   Alcohol use: No   Drug use: No   Sexual activity: Not on file  Other Topics Concern   Not on file  Social History Narrative  Not on file   Social Determinants of Health   Financial Resource Strain: Not on file  Food Insecurity: Not on file  Transportation Needs: Not on file  Physical Activity: Not on file  Stress: Not on file  Social Connections: Not on file     Family History: The patient's family history includes Cancer in her mother; Colon cancer in her brother; Hypertension in her brother, mother, and sister. There is no history of Pulmonary disease, Pancreatic cancer, Stomach cancer, Esophageal cancer, or Liver disease. ROS:   Please see the history of present illness.    All 14 point review of systems negative except as described per history of present illness  EKGs/Labs/Other Studies Reviewed:      Recent Labs: 09/10/2020: ALT 23; BUN 14; Creatinine, Ser 0.81; Hemoglobin 14.1; Platelets 227; Potassium 4.1; Sodium 138  Recent Lipid Panel No results found for: CHOL, TRIG, HDL, CHOLHDL, VLDL, LDLCALC, LDLDIRECT  Physical Exam:    VS:  BP 136/88 (BP Location: Right Arm, Patient Position: Sitting)   Pulse 71   Ht 5\' 6"  (1.676 m)   Wt 256 lb (116.1 kg)   SpO2 96%   BMI 41.32 kg/m     Wt Readings from Last 3 Encounters:  09/17/20 256 lb (116.1 kg)  09/10/20 257 lb (116.6 kg)   07/26/20 258 lb (117 kg)     GEN:  Well nourished, well developed in no acute distress HEENT: Normal NECK: No JVD; No carotid bruits LYMPHATICS: No lymphadenopathy CARDIAC: RRR, no murmurs, no rubs, no gallops RESPIRATORY:  Clear to auscultation without rales, wheezing or rhonchi  ABDOMEN: Soft, non-tender, non-distended MUSCULOSKELETAL:  No edema; No deformity  SKIN: Warm and dry LOWER EXTREMITIES: no swelling NEUROLOGIC:  Alert and oriented x 3 PSYCHIATRIC:  Normal affect   ASSESSMENT:    1. Syncope and collapse   2. Nonsustained ventricular tachycardia (HCC)   3. Pulmonary artery hypertension (HCC)   4. History of pulmonary embolism   5. Palpitations    PLAN:    In order of problems listed above:  Syncope and collapse.  Denies having any recent.  She did have some episode of near syncope is what brought her to the emergency room work-up was negative.  She is scheduled to see EP within the next few weeks.  She did wear a monitor which showed nonsustained ventricular tachycardia I will increase dose of beta-blocker today from metoprolol succinate 50 to metoprolol succinate 75. Pulmonary hypertension last echocardiogram did not show that. History of pulmonary emboli we will continue anticoagulation Palpitations: Beta-blocker will be increased.   Medication Adjustments/Labs and Tests Ordered: Current medicines are reviewed at length with the patient today.  Concerns regarding medicines are outlined above.  No orders of the defined types were placed in this encounter.  Medication changes: No orders of the defined types were placed in this encounter.   Signed, 07/28/20, MD, [facility] 09/17/2020 3:54 PM    [facility]

## 2020-09-17 NOTE — Patient Instructions (Signed)
Medication Instructions:  Your physician has recommended you make the following change in your medication:  INCREASE: Metoprolol succinate 75 mg once daily.  *If you need a refill on your cardiac medications before your next appointment, please call your pharmacy*   Lab Work: None If you have labs (blood work) drawn today and your tests are completely normal, you will receive your results only by: MyChart Message (if you have MyChart) OR A paper copy in the mail If you have any lab test that is abnormal or we need to change your treatment, we will call you to review the results.   Testing/Procedures: None   Follow-Up: At [facility], you and your health needs are our priority.  As part of our continuing mission to provide you with exceptional heart care, we have created designated Provider Care Teams.  These Care Teams include your primary Cardiologist (physician) and Advanced Practice Providers (APPs -  Physician Assistants and Nurse Practitioners) who all work together to provide you with the care you need, when you need it.  We recommend signing up for the patient portal called "MyChart".  Sign up information is provided on this After Visit Summary.  MyChart is used to connect with patients for Virtual Visits (Telemedicine).  Patients are able to view lab/test results, encounter notes, upcoming appointments, etc.  Non-urgent messages can be sent to your provider as well.   To learn more about what you can do with MyChart, go to [email or web address].    Your next appointment:   2 month(s)  The format for your next appointment:   In Person  Provider:   Gypsy Balsam, MD   Other Instructions

## 2020-09-20 ENCOUNTER — Ambulatory Visit: Payer: PPO | Admitting: Cardiology

## 2020-09-20 DIAGNOSIS — L900 Lichen sclerosus et atrophicus: Secondary | ICD-10-CM | POA: Diagnosis not present

## 2020-09-20 DIAGNOSIS — N952 Postmenopausal atrophic vaginitis: Secondary | ICD-10-CM | POA: Diagnosis not present

## 2020-09-20 DIAGNOSIS — R300 Dysuria: Secondary | ICD-10-CM | POA: Diagnosis not present

## 2020-09-24 ENCOUNTER — Telehealth: Payer: Self-pay | Admitting: Cardiology

## 2020-09-24 MED ORDER — METOPROLOL SUCCINATE ER 50 MG PO TB24: 75.0000 mg | ORAL_TABLET | Freq: Every day | ORAL | 3 refills | Status: DC

## 2020-09-24 NOTE — Telephone Encounter (Signed)
Pt c/o medication issue:  1. Name of Medication: Metoprolol Succinate 50 MG CS24  2. How are you currently taking this medication (dosage and times per day)?   3. Are you having a reaction (difficulty breathing--STAT)?   4. What is your medication issue? Pt is stating the pharmacy needs the script to be written as a tablet

## 2020-09-24 NOTE — Telephone Encounter (Signed)
Refill sent in per request.  

## 2020-10-01 ENCOUNTER — Ambulatory Visit: Payer: PPO | Admitting: Cardiology

## 2020-10-01 ENCOUNTER — Encounter: Payer: Self-pay | Admitting: Cardiology

## 2020-10-01 ENCOUNTER — Other Ambulatory Visit: Payer: Self-pay

## 2020-10-01 VITALS — BP 130/64 | HR 86 | Ht 66.0 in | Wt 260.0 lb

## 2020-10-01 DIAGNOSIS — R55 Syncope and collapse: Secondary | ICD-10-CM | POA: Diagnosis not present

## 2020-10-01 NOTE — Progress Notes (Signed)
Electrophysiology Office Note   Date:  10/01/2020   ID:  Gail Olson, DOB 08-29-1950, MRN [id number]  PCP:  Shellia Cleverly, PA  Cardiologist:  Bing Matter Primary Electrophysiologist:  Regan Lemming, MD    Chief Complaint: VT   History of Present Illness: Gail Olson is a 70 y.o. female who is being seen today for the evaluation of VT at the request of Georgeanna Lea, MD. Presenting today for electrophysiology evaluation.  She has a history significant for DVT/PE, hypertension, OSA, sinus tachycardia.  She had been experiencing dizziness.  She wore a cardiac monitor that showed nonsustained ventricular tachycardia of 8 beats.  When she has her episodes, she has significant headache, and dizziness.  This is happened multiple times.  She wore an initial cardiac monitor that showed an episode of SVT, but was otherwise with sinus rhythm and sinus tachycardia.  She is on metoprolol, and her dose was increased from 50 to 75.  This improved her symptoms.  She has not had any further episodes since her metoprolol dose was increased.  Today, she denies symptoms of palpitations, chest pain, shortness of breath, orthopnea, PND, lower extremity edema, claudication, dizziness, presyncope, syncope, bleeding, or neurologic sequela. The patient is tolerating medications without difficulties.    Past Medical History:  Diagnosis Date   Age-related osteoporosis without current pathological fracture 12/16/2013   Formatting of this note might be different from the original. Overview:  Due to history of fracture with the background low bone mass (osteopenia)   Arthritis    Atypical chest pain 11/01/2015   Chest pain 03/18/2017   Chronic deep vein thrombosis (DVT) of proximal vein of left lower extremity (HCC) 03/16/2019   DVT (deep venous thrombosis) (HCC)    Dyspnea on exertion 08/01/2015   Elevated blood-pressure reading without diagnosis of hypertension 05/24/2014   Overview:  IMOUPDATE    Gastroesophageal reflux disease without esophagitis 05/24/2014   Overview:  Stable on Prilosec. Overview:  Overview:  Stable on Prilosec.  Formatting of this note might be different from the original. Stable on Prilosec. Formatting of this note might be different from the original. Overview:  Stable on Prilosec.  Overview:  Overview:  Stable on Prilosec. Overview:  Overview:  Stable on Prilosec.   History of pulmonary embolism 07/30/2018   Hypothyroidism 02/26/2017   Lichen sclerosus et atrophicus 12/16/2013   Nodule of right lung 10/25/2015   Overview:  Per cxr done at [facility] on Aug 24/2017  RUL  Overview:  Overview:  Per cxr done at [facility] on Aug 24/2017  RUL   Formatting of this note might be different from the original. Per cxr done at [facility] on Aug 24/2017  RUL Formatting of this note might be different from the original. Overview:  Per cxr done at [facility] on Aug 24/2017  RUL   Overview:  Overview:  Per cxr done at [facility] on Aug 24/2017  RUL  Overview:   Obesity (BMI 30-39.9) 04/20/2018   OSA (obstructive sleep apnea) 05/06/2017   ONO with RA 04/27/17 >> test time 8 hrs 21 min.  Basal SpO2 92%, low SpO2 83%.  Spent 18 min with SpO2 < 88%.  ONO shows intermittent oxygen desaturation, and pattern is concerning for sleep apnea.    Palpitations 11/01/2015   Precordial chest pain 10/07/2017   Primary hypercoagulable state (HCC) 03/16/2019   Pulmonary artery hypertension (HCC) 05/21/2017   Pulmonary embolism (HCC)    Simple chronic bronchitis (HCC) 05/06/2017   Sinus  tachycardia 08/01/2015   Thyroid disease    Vitamin D deficiency 12/16/2013   Past Surgical History:  Procedure Laterality Date   APPENDECTOMY     BACK SURGERY     CHOLECYSTECTOMY     COLONOSCOPY  02/27/2006   Dr Rayfield Citizen  Polyp in the hepatic flexure (polypectomy) Diverticulosis of the sigmoid colon.   ESOPHAGOGASTRODUODENOSCOPY  02/27/2006   Dr Rayfield Citizen  Normal EGD to descending duodenum.    OVARY SURGERY       Current Outpatient Medications  Medication  Sig Dispense Refill   acetaminophen (TYLENOL) 650 MG CR tablet Take 650 mg by mouth every 8 (eight) hours as needed for pain.     albuterol (PROAIR HFA) 108 (90 Base) MCG/ACT inhaler Inhale 2 puffs into the lungs every 6 (six) hours as needed for wheezing or shortness of breath. 1 each 5   ascorbic acid (VITAMIN C) 500 MG tablet Take 500 mg by mouth daily.     betamethasone dipropionate (DIPROLENE) 0.05 % ointment Apply 1 application topically 2 (two) times daily as needed (rash).  3   clotrimazole-betamethasone (LOTRISONE) cream Apply 1 application topically in the morning and at bedtime. Unknown strength     ELIQUIS 5 MG TABS tablet TAKE 1 TABLET BY MOUTH TWICE A DAY (Patient taking differently: Take 5 mg by mouth 2 (two) times daily.) 180 tablet 1   fluticasone (FLONASE) 50 MCG/ACT nasal spray PLACE 2 SPRAYS INTO BOTH NOSTRILS AT BEDTIME AS NEEDED FOR ALLERGIES OR RHINITIS. (Patient taking differently: Place 2 sprays into both nostrils 2 (two) times daily.) 16 g 2   fluticasone (FLOVENT HFA) 110 MCG/ACT inhaler Inhale 2 puffs into the lungs 2 (two) times daily as needed (Cough, wheeze, chest congestion, or shortness of breath). 1 Inhaler 12   hydrochlorothiazide (MICROZIDE) 12.5 MG capsule Take 12.5 mg by mouth daily as needed for edema or fluid.  2   HYDROcodone-acetaminophen (NORCO/VICODIN) 5-325 MG tablet Take 1 tablet by mouth every 8 (eight) hours as needed for pain.  0   Lactobacillus (PROBIOTIC ACIDOPHILUS PO) Take 1 capsule by mouth daily. Unknown strenght     levothyroxine (SYNTHROID, LEVOTHROID) 50 MCG tablet Take 50 mcg by mouth daily.      loratadine (CLARITIN) 10 MG tablet Take 1 tablet (10 mg total) by mouth daily. (Patient taking differently: Take 10 mg by mouth 2 (two) times daily.) 30 tablet 11   meclizine (ANTIVERT) 25 MG tablet Take 25 mg by mouth 3 (three) times daily as needed for dizziness.     methocarbamol (ROBAXIN) 750 MG tablet Take 750 mg by mouth 3 (three) times daily as  needed for muscle spasms.      metoprolol succinate (TOPROL-XL) 50 MG 24 hr tablet Take 1.5 tablets (75 mg total) by mouth daily. Take with or immediately following a meal. 135 tablet 3   montelukast (SINGULAIR) 10 MG tablet Take 1 tablet (10 mg total) by mouth at bedtime. (Patient taking differently: Take 10 mg by mouth as needed (Allergies).) 30 tablet 5   Omega-3 Fatty Acids (FISH OIL) 500 MG CAPS Take 1 capsule by mouth daily. Mega Red     omeprazole (PRILOSEC) 20 MG capsule Take 20 mg by mouth daily as needed (for reflux).   0   pantoprazole (PROTONIX) 40 MG tablet Take 1 tablet (40 mg total) by mouth daily. 90 tablet 3   Vitamin D, Ergocalciferol, (DRISDOL) 50000 units CAPS capsule Take 50,000 Units by mouth once a week.     No  current facility-administered medications for this visit.    Allergies:   Nsaids, Other, Prednisone, Risedronate, Adhesive [tape], Codeine, and Penicillins   Social History:  The patient  reports that she has never smoked. She has never used smokeless tobacco. She reports that she does not drink alcohol and does not use drugs.   Family History:  The patient's family history includes Cancer in her mother; Colon cancer in her brother; Hypertension in her brother, mother, and sister.    ROS:  Please see the history of present illness.   Otherwise, review of systems is positive for none.   All other systems are reviewed and negative.    PHYSICAL EXAM: VS:  BP 130/64   Pulse 86   Ht 5\' 6"  (1.676 m)   Wt 260 lb (117.9 kg)   SpO2 96%   BMI 41.97 kg/m  , BMI Body mass index is 41.97 kg/m. GEN: Well nourished, well developed, in no acute distress  HEENT: normal  Neck: no JVD, carotid bruits, or masses Cardiac: RRR; no murmurs, rubs, or gallops,no edema  Respiratory:  clear to auscultation bilaterally, normal work of breathing GI: soft, nontender, nondistended, + BS MS: no deformity or atrophy  Skin: warm and dry Neuro:  Strength and sensation are  intact Psych: euthymic mood, full affect  EKG:  EKG is not ordered today. Personal review of the ekg ordered 09/17/20 shows sinus rhythm, rate 84  Recent Labs: 09/10/2020: ALT 23; BUN 14; Creatinine, Ser 0.81; Hemoglobin 14.1; Platelets 227; Potassium 4.1; Sodium 138    Lipid Panel  No results found for: CHOL, TRIG, HDL, CHOLHDL, VLDL, LDLCALC, LDLDIRECT   Wt Readings from Last 3 Encounters:  10/01/20 260 lb (117.9 kg)  09/17/20 256 lb (116.1 kg)  09/10/20 257 lb (116.6 kg)      Other studies Reviewed: Additional studies/ records that were reviewed today include: Cardiac monitor 08/18/2019 personally reviewed Review of the above records today demonstrates:  1 episode of ventricular tachycardia total of 8 beats at a maximal rate of 231. 8 episode of supraventricular tachycardia Multiple triggered events showing sinus rhythm  TTE 03/13/20  1. Left ventricular ejection fraction, by estimation, is 60 to 65%. The  left ventricle has normal function. The left ventricle has no regional  wall motion abnormalities. Left ventricular diastolic parameters were  normal.   2. Right ventricular systolic function is normal. The right ventricular  size is normal. There is normal pulmonary artery systolic pressure.   3. The mitral valve is normal in structure. No evidence of mitral valve  regurgitation. No evidence of mitral stenosis.   4. The aortic valve is normal in structure. Aortic valve regurgitation is  not visualized. No aortic stenosis is present.   5. The inferior vena cava is normal in size with greater than 50%  respiratory variability, suggesting right atrial pressure of 3 mmHg.   ASSESSMENT AND PLAN:  1.  Syncope: Has had nonsustained VT on her cardiac monitor.  Despite this, she was asymptomatic when she had her episode of VT. fortunately, since increasing her metoprolol dose, she has had no further episodes.  I told her that if she continues to have episodes, or if they restart,  that we can plan for a Linq monitor implant.  For now, both her and her husband are happy with the way that she has been feeling.  We Gail Olson continue with current management.  Case discussed with primary cardiology    Current medicines are reviewed at length with  the patient today.   The patient does not have concerns regarding her medicines.  The following changes were made today:  none  Labs/ tests ordered today include:  No orders of the defined types were placed in this encounter.    Disposition:   FU with Gail Olson as needed  Signed, Marquavion Venhuizen Jorja LoaMartin Deshay Blumenfeld, MD  10/01/2020 10:38 AM     [facility] [address] [place] [id number] [phone] (office) [phone] (fax)

## 2020-10-08 DIAGNOSIS — Z1231 Encounter for screening mammogram for malignant neoplasm of breast: Secondary | ICD-10-CM | POA: Diagnosis not present

## 2020-10-26 ENCOUNTER — Other Ambulatory Visit: Payer: Self-pay | Admitting: Cardiology

## 2020-10-26 NOTE — Telephone Encounter (Signed)
Prescription refill request for Eliquis received. Indication:Afib Last office visit:10/01/20 ([place])  Scr: 0.81 (09/10/20) Age: 70 Weight: 117.9kg  Appropriate dose and refill sent ot requested pharmacy.

## 2020-11-16 ENCOUNTER — Ambulatory Visit: Payer: PPO | Admitting: Cardiology

## 2020-11-16 ENCOUNTER — Encounter: Payer: Self-pay | Admitting: Cardiology

## 2020-11-16 ENCOUNTER — Other Ambulatory Visit: Payer: Self-pay

## 2020-11-16 VITALS — BP 128/74 | HR 78 | Ht 66.0 in | Wt 259.0 lb

## 2020-11-16 DIAGNOSIS — I825Y2 Chronic embolism and thrombosis of unspecified deep veins of left proximal lower extremity: Secondary | ICD-10-CM

## 2020-11-16 DIAGNOSIS — Z86711 Personal history of pulmonary embolism: Secondary | ICD-10-CM | POA: Diagnosis not present

## 2020-11-16 DIAGNOSIS — R55 Syncope and collapse: Secondary | ICD-10-CM | POA: Diagnosis not present

## 2020-11-16 DIAGNOSIS — I472 Ventricular tachycardia: Secondary | ICD-10-CM | POA: Diagnosis not present

## 2020-11-16 DIAGNOSIS — I4729 Other ventricular tachycardia: Secondary | ICD-10-CM

## 2020-11-16 NOTE — Patient Instructions (Signed)

## 2020-11-16 NOTE — Progress Notes (Signed)
Cardiology Office Note:    Date:  11/16/2020   ID:  TERRYANN VERBEEK, DOB May 06, 1950, MRN [id number]  PCP:  Shellia Cleverly, PA  Cardiologist:  Gypsy Balsam, MD    Referring MD: Shellia Cleverly, [place]   Chief Complaint  Patient presents with   Follow-up    Want to discuss cataract surgery   I am doing fine with past medical history significant for pulm emboli, atypical chest pain, dyslipidemia, obstructive sleep apnea, syncope and collapse, no sustained ventricular tachycardia  History of Present Illness:    REEGAN Olson is a 70 y.o. female  with past medical history significant for pulm emboli, atypical chest pain, dyslipidemia, obstructive sleep apnea, syncope and collapse, no sustained ventricular tachycardia she comes today to my office for follow-up since we increased dose of metoprolol she feels dramatically better.  There is no chest pain tightness squeezing pressure burning chest.  She did see EP team with recommendation to continue monitoring with instruction to proceed with implantable loop recorder if symptoms recur  Past Medical History:  Diagnosis Date   Age-related osteoporosis without current pathological fracture 12/16/2013   Formatting of this note might be different from the original. Overview:  Due to history of fracture with the background low bone mass (osteopenia)   Arthritis    Atypical chest pain 11/01/2015   Chest pain 03/18/2017   Chronic deep vein thrombosis (DVT) of proximal vein of left lower extremity (HCC) 03/16/2019   DVT (deep venous thrombosis) (HCC)    Dyspnea on exertion 08/01/2015   Elevated blood-pressure reading without diagnosis of hypertension 05/24/2014   Overview:  IMOUPDATE   Gastroesophageal reflux disease without esophagitis 05/24/2014   Overview:  Stable on Prilosec. Overview:  Overview:  Stable on Prilosec.  Formatting of this note might be different from the original. Stable on Prilosec. Formatting of this note might be different from the  original. Overview:  Stable on Prilosec.  Overview:  Overview:  Stable on Prilosec. Overview:  Overview:  Stable on Prilosec.   History of pulmonary embolism 07/30/2018   Hypothyroidism 02/26/2017   Lichen sclerosus et atrophicus 12/16/2013   Nodule of right lung 10/25/2015   Overview:  Per cxr done at [facility] on Aug 24/2017  RUL  Overview:  Overview:  Per cxr done at [facility] on Aug 24/2017  RUL   Formatting of this note might be different from the original. Per cxr done at [facility] on Aug 24/2017  RUL Formatting of this note might be different from the original. Overview:  Per cxr done at [facility] on Aug 24/2017  RUL   Overview:  Overview:  Per cxr done at [facility] on Aug 24/2017  RUL  Overview:   Obesity (BMI 30-39.9) 04/20/2018   OSA (obstructive sleep apnea) 05/06/2017   ONO with RA 04/27/17 >> test time 8 hrs 21 min.  Basal SpO2 92%, low SpO2 83%.  Spent 18 min with SpO2 < 88%.  ONO shows intermittent oxygen desaturation, and pattern is concerning for sleep apnea.    Palpitations 11/01/2015   Precordial chest pain 10/07/2017   Primary hypercoagulable state (HCC) 03/16/2019   Pulmonary artery hypertension (HCC) 05/21/2017   Pulmonary embolism (HCC)    Simple chronic bronchitis (HCC) 05/06/2017   Sinus tachycardia 08/01/2015   Thyroid disease    Vitamin D deficiency 12/16/2013    Past Surgical History:  Procedure Laterality Date   APPENDECTOMY     BACK SURGERY     CHOLECYSTECTOMY  COLONOSCOPY  02/27/2006   Dr Rayfield Citizen  Polyp in the hepatic flexure (polypectomy) Diverticulosis of the sigmoid colon.   ESOPHAGOGASTRODUODENOSCOPY  02/27/2006   Dr Rayfield Citizen  Normal EGD to descending duodenum.    OVARY SURGERY      Current Medications: Current Meds  Medication Sig   acetaminophen (TYLENOL) 650 MG CR tablet Take 650 mg by mouth every 8 (eight) hours as needed for pain.   albuterol (PROAIR HFA) 108 (90 Base) MCG/ACT inhaler Inhale 2 puffs into the lungs every 6 (six) hours as needed for wheezing or shortness of breath.    ascorbic acid (VITAMIN C) 500 MG tablet Take 500 mg by mouth daily.   betamethasone dipropionate (DIPROLENE) 0.05 % ointment Apply 1 application topically 2 (two) times daily as needed (rash).   clotrimazole-betamethasone (LOTRISONE) cream Apply 1 application topically in the morning and at bedtime. Unknown strength   ELIQUIS 5 MG TABS tablet TAKE 1 TABLET BY MOUTH TWICE A DAY (Patient taking differently: Take 5 mg by mouth 2 (two) times daily.)   fluticasone (FLONASE) 50 MCG/ACT nasal spray PLACE 2 SPRAYS INTO BOTH NOSTRILS AT BEDTIME AS NEEDED FOR ALLERGIES OR RHINITIS. (Patient taking differently: Place 2 sprays into both nostrils 2 (two) times daily.)   fluticasone (FLOVENT HFA) 110 MCG/ACT inhaler Inhale 2 puffs into the lungs 2 (two) times daily as needed (Cough, wheeze, chest congestion, or shortness of breath).   hydrochlorothiazide (MICROZIDE) 12.5 MG capsule Take 12.5 mg by mouth daily as needed for edema or fluid.   HYDROcodone-acetaminophen (NORCO/VICODIN) 5-325 MG tablet Take 1 tablet by mouth every 8 (eight) hours as needed for pain.   Lactobacillus (PROBIOTIC ACIDOPHILUS PO) Take 1 capsule by mouth daily. Unknown strenght   levothyroxine (SYNTHROID, LEVOTHROID) 50 MCG tablet Take 50 mcg by mouth daily.    loratadine (CLARITIN) 10 MG tablet Take 1 tablet (10 mg total) by mouth daily. (Patient taking differently: Take 10 mg by mouth 2 (two) times daily.)   meclizine (ANTIVERT) 25 MG tablet Take 25 mg by mouth 3 (three) times daily as needed for dizziness.   methocarbamol (ROBAXIN) 750 MG tablet Take 750 mg by mouth 3 (three) times daily as needed for muscle spasms.    metoprolol succinate (TOPROL-XL) 50 MG 24 hr tablet Take 1.5 tablets (75 mg total) by mouth daily. Take with or immediately following a meal.   montelukast (SINGULAIR) 10 MG tablet Take 1 tablet (10 mg total) by mouth at bedtime. (Patient taking differently: Take 10 mg by mouth as needed (Allergies).)   Omega-3 Fatty Acids  (FISH OIL) 500 MG CAPS Take 1 capsule by mouth daily. Mega Red   omeprazole (PRILOSEC) 20 MG capsule Take 20 mg by mouth daily as needed (for reflux).    pantoprazole (PROTONIX) 40 MG tablet Take 1 tablet (40 mg total) by mouth daily.   Vitamin D, Ergocalciferol, (DRISDOL) 50000 units CAPS capsule Take 50,000 Units by mouth once a week.     Allergies:   Nsaids, Other, Prednisone, Risedronate, Adhesive [tape], Codeine, and Penicillins   Social History   Socioeconomic History   Marital status: Married    Spouse name: Not on file   Number of children: 3   Years of education: Not on file   Highest education level: Not on file  Occupational History   Not on file  Tobacco Use   Smoking status: Never   Smokeless tobacco: Never  Vaping Use   Vaping Use: Never used  Substance and Sexual Activity  Alcohol use: No   Drug use: No   Sexual activity: Not on file  Other Topics Concern   Not on file  Social History Narrative   Not on file   Social Determinants of Health   Financial Resource Strain: Not on file  Food Insecurity: Not on file  Transportation Needs: Not on file  Physical Activity: Not on file  Stress: Not on file  Social Connections: Not on file     Family History: The patient's family history includes Cancer in her mother; Colon cancer in her brother; Hypertension in her brother, mother, and sister. There is no history of Pulmonary disease, Pancreatic cancer, Stomach cancer, Esophageal cancer, or Liver disease. ROS:   Please see the history of present illness.    All 14 point review of systems negative except as described per history of present illness  EKGs/Labs/Other Studies Reviewed:      Recent Labs: 09/10/2020: ALT 23; BUN 14; Creatinine, Ser 0.81; Hemoglobin 14.1; Platelets 227; Potassium 4.1; Sodium 138  Recent Lipid Panel No results found for: CHOL, TRIG, HDL, CHOLHDL, VLDL, LDLCALC, LDLDIRECT  Physical Exam:    VS:  BP 128/74 (BP Location: Right Arm,  Patient Position: Sitting)   Pulse 78   Ht 5\' 6"  (1.676 m)   Wt 259 lb (117.5 kg)   SpO2 98%   BMI 41.80 kg/m     Wt Readings from Last 3 Encounters:  11/16/20 259 lb (117.5 kg)  10/01/20 260 lb (117.9 kg)  09/17/20 256 lb (116.1 kg)     GEN:  Well nourished, well developed in no acute distress HEENT: Normal NECK: No JVD; No carotid bruits LYMPHATICS: No lymphadenopathy CARDIAC: RRR, no murmurs, no rubs, no gallops RESPIRATORY:  Clear to auscultation without rales, wheezing or rhonchi  ABDOMEN: Soft, non-tender, non-distended MUSCULOSKELETAL:  No edema; No deformity  SKIN: Warm and dry LOWER EXTREMITIES: no swelling NEUROLOGIC:  Alert and oriented x 3 PSYCHIATRIC:  Normal affect   ASSESSMENT:    1. Nonsustained ventricular tachycardia (HCC)   2. Syncope and collapse   3. Chronic deep vein thrombosis (DVT) of proximal vein of left lower extremity (HCC)   4. History of pulmonary embolism    PLAN:    In order of problems listed above:  History of DVT/PE.  She is anticoagulated which I will continue.  Doing well from that point review on Eliquis 5 mg twice daily Syncope and collapse denies having any since she was put on higher dose of beta-blocker Nonsustained ventricular tachycardia denies having dizziness or palpitations. I did review note by EP for this visit   Medication Adjustments/Labs and Tests Ordered: Current medicines are reviewed at length with the patient today.  Concerns regarding medicines are outlined above.  No orders of the defined types were placed in this encounter.  Medication changes: No orders of the defined types were placed in this encounter.   Signed, 09/19/20, MD, [facility] 11/16/2020 2:15 PM    [facility]

## 2020-12-07 DIAGNOSIS — J324 Chronic pansinusitis: Secondary | ICD-10-CM | POA: Diagnosis not present

## 2020-12-26 DIAGNOSIS — R7301 Impaired fasting glucose: Secondary | ICD-10-CM | POA: Diagnosis not present

## 2020-12-26 DIAGNOSIS — I10 Essential (primary) hypertension: Secondary | ICD-10-CM | POA: Diagnosis not present

## 2020-12-26 DIAGNOSIS — Z6841 Body Mass Index (BMI) 40.0 and over, adult: Secondary | ICD-10-CM | POA: Diagnosis not present

## 2020-12-26 DIAGNOSIS — E039 Hypothyroidism, unspecified: Secondary | ICD-10-CM | POA: Diagnosis not present

## 2020-12-26 DIAGNOSIS — R5383 Other fatigue: Secondary | ICD-10-CM | POA: Diagnosis not present

## 2020-12-26 DIAGNOSIS — E538 Deficiency of other specified B group vitamins: Secondary | ICD-10-CM | POA: Diagnosis not present

## 2020-12-26 DIAGNOSIS — E559 Vitamin D deficiency, unspecified: Secondary | ICD-10-CM | POA: Diagnosis not present

## 2020-12-26 DIAGNOSIS — I825Y2 Chronic embolism and thrombosis of unspecified deep veins of left proximal lower extremity: Secondary | ICD-10-CM | POA: Diagnosis not present

## 2020-12-26 DIAGNOSIS — F411 Generalized anxiety disorder: Secondary | ICD-10-CM | POA: Diagnosis not present

## 2021-02-07 DIAGNOSIS — J019 Acute sinusitis, unspecified: Secondary | ICD-10-CM | POA: Diagnosis not present

## 2021-02-07 DIAGNOSIS — R0981 Nasal congestion: Secondary | ICD-10-CM | POA: Diagnosis not present

## 2021-02-07 DIAGNOSIS — R059 Cough, unspecified: Secondary | ICD-10-CM | POA: Diagnosis not present

## 2021-04-30 ENCOUNTER — Other Ambulatory Visit: Payer: Self-pay | Admitting: Cardiology

## 2021-04-30 NOTE — Telephone Encounter (Signed)
Pt last saw Dr Bing Matter 11/16/20, last labs 12/26/20 Creat 0.71, age 71, weight 117.5kg, based on specified criteria pt is on appropriate dosage of Eliquis 5mg  BID for DVT/PE.  Will refill rx.

## 2021-05-23 ENCOUNTER — Encounter: Payer: Self-pay | Admitting: Cardiology

## 2021-05-23 ENCOUNTER — Other Ambulatory Visit: Payer: Self-pay

## 2021-05-23 ENCOUNTER — Ambulatory Visit (INDEPENDENT_AMBULATORY_CARE_PROVIDER_SITE_OTHER): Payer: PPO | Admitting: Cardiology

## 2021-05-23 VITALS — BP 134/74 | HR 68 | Ht 66.0 in | Wt 252.0 lb

## 2021-05-23 DIAGNOSIS — Z86711 Personal history of pulmonary embolism: Secondary | ICD-10-CM | POA: Diagnosis not present

## 2021-05-23 DIAGNOSIS — R55 Syncope and collapse: Secondary | ICD-10-CM

## 2021-05-23 DIAGNOSIS — R072 Precordial pain: Secondary | ICD-10-CM | POA: Diagnosis not present

## 2021-05-23 DIAGNOSIS — R002 Palpitations: Secondary | ICD-10-CM | POA: Diagnosis not present

## 2021-05-23 DIAGNOSIS — I2721 Secondary pulmonary arterial hypertension: Secondary | ICD-10-CM

## 2021-05-23 DIAGNOSIS — R0609 Other forms of dyspnea: Secondary | ICD-10-CM

## 2021-05-23 NOTE — Patient Instructions (Signed)

## 2021-05-23 NOTE — Progress Notes (Signed)
?Cardiology Office Note:   ? ?Date:  05/23/2021  ? ?ID:  Gail Olson, DOB 10-13-1950, MRN [id number] ? ?PCP:  Shellia CleverlyBeane, Lori M, PA  ?Cardiologist:  Gypsy Balsamobert Karthik Whittinghill, MD   ? ?Referring MD: Shellia CleverlyBeane, Lori M, PA  ? ?No chief complaint on file. ?Doing fine ? ?History of Present Illness:   ? ?Gail DullBrenda H Olson is a 71 y.o. female with past medical history significant for pulmonary emboli, still anticoagulated, atypical chest pain, dyslipidemia, obstructive sleep apnea, history of syncope and collapse, no sustained ventricular tachycardia.  That being evaluated by EP team after modification of his medications he seems to be doing well. ?She complain of having some allergies.  She tried to have some Claritin however there is some issue with insurance.  She is also scheduled to have cataract surgery there is a long discussion about that.  She is worried about her heart speeding up when she will have her surgery. ? ?Past Medical History:  ?Diagnosis Date  ? Age-related osteoporosis without current pathological fracture 12/16/2013  ? Formatting of this note might be different from the original. Overview:  Due to history of fracture with the background low bone mass (osteopenia)  ? Arthritis   ? Atypical chest pain 11/01/2015  ? Chest pain 03/18/2017  ? Chronic deep vein thrombosis (DVT) of proximal vein of left lower extremity (HCC) 03/16/2019  ? DVT (deep venous thrombosis) (HCC)   ? Dyspnea on exertion 08/01/2015  ? Elevated blood-pressure reading without diagnosis of hypertension 05/24/2014  ? Overview:  IMOUPDATE  ? Gastroesophageal reflux disease without esophagitis 05/24/2014  ? Overview:  Stable on Prilosec. Overview:  Overview:  Stable on Prilosec.  Formatting of this note might be different from the original. Stable on Prilosec. Formatting of this note might be different from the original. Overview:  Stable on Prilosec.  Overview:  Overview:  Stable on Prilosec. Overview:  Overview:  Stable on Prilosec.  ? History of  pulmonary embolism 07/30/2018  ? Hypothyroidism 02/26/2017  ? Lichen sclerosus et atrophicus 12/16/2013  ? Nodule of right lung 10/25/2015  ? Overview[facility]  [facility]  RUL  Overview:  Overview[facility]  [facility]  [facility]   Formatting of this note might be different from the original. [facility]  RUL Formatting of this note might be different from the original. Overview:  [facility]  [facility]   Overview:  Overview[facility]  [facility]  [facility]  Overview:  ? Obesity (BMI 30-39.9) 04/20/2018  ? OSA (obstructive sleep apnea) 05/06/2017  ? ONO with RA 04/27/17 >> test time 8 hrs 21 min.  Basal SpO2 92%, low SpO2 83%.  Spent 18 min with SpO2 < 88%.  ONO shows intermittent oxygen desaturation, and pattern is concerning for sleep apnea.   ? Palpitations 11/01/2015  ? Precordial chest pain 10/07/2017  ? Primary hypercoagulable state (HCC) 03/16/2019  ? Pulmonary artery hypertension (HCC) 05/21/2017  ? Pulmonary embolism (HCC)   ? Simple chronic bronchitis (HCC) 05/06/2017  ? Sinus tachycardia 08/01/2015  ? Thyroid disease   ? Vitamin D deficiency 12/16/2013  ? ? ?Past Surgical History:  ?Procedure Laterality Date  ? APPENDECTOMY    ? BACK SURGERY    ? CHOLECYSTECTOMY    ? COLONOSCOPY  02/27/2006  ? Dr Rayfield CitizenBulter  Polyp in the hepatic flexure (polypectomy) Diverticulosis of the  sigmoid colon.  ? ESOPHAGOGASTRODUODENOSCOPY  02/27/2006  ? Dr Rayfield Citizen  Normal EGD to descending duodenum.   ? OVARY SURGERY    ? ? ?Current Medications: ?Current Meds  ?Medication Sig  ? acetaminophen (TYLENOL) 650 MG CR tablet Take 650 mg by mouth every 8 (eight) hours as needed for pain.  ? albuterol (PROAIR HFA) 108 (90 Base) MCG/ACT inhaler Inhale 2 puffs into the lungs every 6 (six) hours as needed for wheezing or shortness of breath.  ? ascorbic acid (VITAMIN C) 500 MG tablet Take 500 mg by mouth daily.  ? betamethasone dipropionate (DIPROLENE) 0.05 % ointment Apply 1 application  topically 2 (two) times daily as needed (rash).  ? clotrimazole-betamethasone (LOTRISONE) cream Apply 1 application topically in the morning and at bedtime. Unknown strength  ? ELIQUIS 5 MG TABS tablet TAKE 1 TABLET BY MOUTH TWICE A DAY (Patient taking differently: Take by mouth 2 (two) times daily.)  ? fluticasone (FLONASE) 50 MCG/ACT nasal spray PLACE 2 SPRAYS INTO BOTH NOSTRILS AT BEDTIME AS NEEDED FOR ALLERGIES OR RHINITIS. (Patient taking differently: Place 2 sprays into both nostrils 2 (two) times daily.)  ? fluticasone (FLOVENT HFA) 110 MCG/ACT inhaler Inhale 2 puffs into the lungs 2 (two) times daily as needed (Cough, wheeze, chest congestion, or shortness of breath).  ? hydrochlorothiazide (MICROZIDE) 12.5 MG capsule Take 12.5 mg by mouth daily as needed for edema or fluid.  ? HYDROcodone-acetaminophen (NORCO/VICODIN) 5-325 MG tablet Take 1 tablet by mouth every 8 (eight) hours as needed for pain.  ? Lactobacillus (PROBIOTIC ACIDOPHILUS PO) Take 1 capsule by mouth daily. Unknown strenght  ? levothyroxine (SYNTHROID, LEVOTHROID) 50 MCG tablet Take 50 mcg by mouth daily.   ? loratadine (CLARITIN) 10 MG tablet Take 1 tablet (10 mg total) by mouth daily. (Patient taking differently: Take 10 mg by mouth 2 (two) times daily.)  ? meclizine (ANTIVERT) 25 MG tablet Take 25 mg by mouth 3 (three) times daily as needed for dizziness.  ? methocarbamol (ROBAXIN) 750 MG tablet Take 750 mg by mouth 3 (three) times daily as needed for muscle spasms.   ? metoprolol succinate (TOPROL-XL) 50 MG 24 hr tablet Take 1.5 tablets (75 mg total) by mouth daily. Take with or immediately following a meal.  ? montelukast (SINGULAIR) 10 MG tablet Take 1 tablet (10 mg total) by mouth at bedtime. (Patient taking differently: Take 10 mg by mouth as needed (Allergies).)  ? Omega-3 Fatty Acids (FISH OIL) 500 MG CAPS Take 1 capsule by mouth daily. Mega Red  ? omeprazole (PRILOSEC) 20 MG capsule Take 20 mg by mouth daily as needed (for reflux).    ? pantoprazole (PROTONIX) 40 MG tablet Take 1 tablet (40 mg total) by mouth daily.  ? Vitamin D, Ergocalciferol, (DRISDOL) 50000 units CAPS capsule Take 50,000 Units by mouth once a week.  ?  ? ?Allergies:   Nsaids, Other, Prednisone, Risedronate, Adhesive [tape], Codeine, and Penicillins  ? ?Social History  ? ?Socioeconomic History  ? Marital status: Married  ?  Spouse name: Not on file  ? Number of children: 3  ? Years of education: Not on file  ? Highest education level: Not on file  ?Occupational History  ? Not on file  ?Tobacco Use  ? Smoking status: Never  ? Smokeless tobacco: Never  ?Vaping Use  ? Vaping Use: Never used  ?Substance and Sexual Activity  ? Alcohol use: No  ? Drug use: No  ? Sexual activity: Not on file  ?Other Topics  Concern  ? Not on file  ?Social History Narrative  ? Not on file  ? ?Social Determinants of Health  ? ?Financial Resource Strain: Not on file  ?Food Insecurity: Not on file  ?Transportation Needs: Not on file  ?Physical Activity: Not on file  ?Stress: Not on file  ?Social Connections: Not on file  ?  ? ?Family History: ?The patient's family history includes Cancer in her mother; Colon cancer in her brother; Hypertension in her brother, mother, and sister. There is no history of Pulmonary disease, Pancreatic cancer, Stomach cancer, Esophageal cancer, or Liver disease. ?ROS:   ?Please see the history of present illness.    ?All 14 point review of systems negative except as described per history of present illness ? ?EKGs/Labs/Other Studies Reviewed:   ? ? ? ?Recent Labs: ?09/10/2020: ALT 23; BUN 14; Creatinine, Ser 0.81; Hemoglobin 14.1; Platelets 227; Potassium 4.1; Sodium 138  ?Recent Lipid Panel ?No results found for: CHOL, TRIG, HDL, CHOLHDL, VLDL, LDLCALC, LDLDIRECT ? ?Physical Exam:   ? ?VS:  There were no vitals taken for this visit.   ? ?Wt Readings from Last 3 Encounters:  ?11/16/20 259 lb (117.5 kg)  ?10/01/20 260 lb (117.9 kg)  ?09/17/20 256 lb (116.1 kg)  ?  ? ?GEN:  Well  nourished, well developed in no acute distress ?HEENT: Normal ?NECK: No JVD; No carotid bruits ?LYMPHATICS: No lymphadenopathy ?CARDIAC: RRR, no murmurs, no rubs, no gallops ?RESPIRATORY:  Clear to auscult

## 2021-08-27 ENCOUNTER — Other Ambulatory Visit: Payer: Self-pay | Admitting: Gastroenterology

## 2021-11-01 ENCOUNTER — Telehealth: Payer: Self-pay | Admitting: Cardiology

## 2021-11-01 ENCOUNTER — Other Ambulatory Visit: Payer: Self-pay | Admitting: Cardiology

## 2021-11-01 DIAGNOSIS — Z86711 Personal history of pulmonary embolism: Secondary | ICD-10-CM

## 2021-11-01 MED ORDER — APIXABAN 5 MG PO TABS: 5.0000 mg | ORAL_TABLET | Freq: Two times a day (BID) | ORAL | 1 refills | Status: DC

## 2021-11-01 NOTE — Telephone Encounter (Signed)
Prescription refill request for Eliquis received. Indication: PE Last office visit: 05/23/21 Bing Matter)  Scr: 0.72 (06/10/21)  Age: 71 Weight: 114.3kg  Appropriate dose and refill sent to requested pharmacy. Marland Kitchen

## 2021-11-01 NOTE — Telephone Encounter (Signed)
*  STAT* If patient is at the pharmacy, call can be transferred to refill team.   1. Which medications need to be refilled? (please list name of each medication and dose if known) ELIQUIS 5 MG TABS tablet  2. Which pharmacy/location (including street and city if local pharmacy) is medication to be sent to? [id number] - [place], [place] - [address]  3. Do they need a 30 day or 90 day supply? 90

## 2021-11-01 NOTE — Telephone Encounter (Signed)
Pt wanted to let Dr. Kirtland Bouchard know that she has covid

## 2021-11-23 ENCOUNTER — Other Ambulatory Visit: Payer: Self-pay | Admitting: Gastroenterology

## 2021-11-23 ENCOUNTER — Other Ambulatory Visit: Payer: Self-pay | Admitting: Cardiology

## 2021-12-04 ENCOUNTER — Ambulatory Visit: Payer: PPO | Attending: Cardiology | Admitting: Cardiology

## 2021-12-04 VITALS — BP 120/78 | HR 100 | Ht 66.0 in | Wt 256.0 lb

## 2021-12-04 DIAGNOSIS — R0609 Other forms of dyspnea: Secondary | ICD-10-CM | POA: Diagnosis not present

## 2021-12-04 DIAGNOSIS — G4733 Obstructive sleep apnea (adult) (pediatric): Secondary | ICD-10-CM | POA: Diagnosis not present

## 2021-12-04 DIAGNOSIS — I2721 Secondary pulmonary arterial hypertension: Secondary | ICD-10-CM

## 2021-12-04 DIAGNOSIS — E669 Obesity, unspecified: Secondary | ICD-10-CM

## 2021-12-04 DIAGNOSIS — R072 Precordial pain: Secondary | ICD-10-CM

## 2021-12-04 NOTE — Patient Instructions (Addendum)

## 2021-12-04 NOTE — Addendum Note (Signed)
Addended by: Darius Bump B on: 12/04/2021 04:50 PM   Modules accepted: Orders

## 2021-12-04 NOTE — Progress Notes (Signed)
Cardiology Office Note:    Date:  12/04/2021   ID:  Gail Olson, DOB 13-Oct-1950, MRN [id number]  PCP:  Shellia Cleverly, PA  Cardiologist:  Gypsy Balsam, MD    Referring MD: Shellia Cleverly, [place]   Chief Complaint  Patient presents with   Follow-up  I am tired exhausted short of breath  History of Present Illness:    Gail Olson is a 71 y.o. female with past medical history significant for pulmonary hypertension, dyspnea on exertion, atypical chest pain, history of DVT.  She comes today to my office for follow-up few months ago she suffered from COVID-19 infection.  Since that time she is being tired exhausted short of breath.  Denies have any chest pain tightness squeezing pressure burning chest  Past Medical History:  Diagnosis Date   Age-related osteoporosis without current pathological fracture 12/16/2013   Formatting of this note might be different from the original. Overview:  Due to history of fracture with the background low bone mass (osteopenia)   Arthritis    Atypical chest pain 11/01/2015   Chest pain 03/18/2017   Chronic deep vein thrombosis (DVT) of proximal vein of left lower extremity (HCC) 03/16/2019   DVT (deep venous thrombosis) (HCC)    Dyspnea on exertion 08/01/2015   Elevated blood-pressure reading without diagnosis of hypertension 05/24/2014   Overview:  IMOUPDATE   Gastroesophageal reflux disease without esophagitis 05/24/2014   Overview:  Stable on Prilosec. Overview:  Overview:  Stable on Prilosec.  Formatting of this note might be different from the original. Stable on Prilosec. Formatting of this note might be different from the original. Overview:  Stable on Prilosec.  Overview:  Overview:  Stable on Prilosec. Overview:  Overview:  Stable on Prilosec.   History of pulmonary embolism 07/30/2018   Hypothyroidism 02/26/2017   Lichen sclerosus et atrophicus 12/16/2013   Nodule of right lung 10/25/2015   Overview:  Per cxr done at [facility] on Aug 24/2017  RUL   Overview:  Overview:  Per cxr done at [facility] on Aug 24/2017  RUL   Formatting of this note might be different from the original. Per cxr done at [facility] on Aug 24/2017  RUL Formatting of this note might be different from the original. Overview:  Per cxr done at [facility] on Aug 24/2017  RUL   Overview:  Overview:  Per cxr done at [facility] on Aug 24/2017  RUL  Overview:   Obesity (BMI 30-39.9) 04/20/2018   OSA (obstructive sleep apnea) 05/06/2017   ONO with RA 04/27/17 >> test time 8 hrs 21 min.  Basal SpO2 92%, low SpO2 83%.  Spent 18 min with SpO2 < 88%.  ONO shows intermittent oxygen desaturation, and pattern is concerning for sleep apnea.    Palpitations 11/01/2015   Precordial chest pain 10/07/2017   Primary hypercoagulable state (HCC) 03/16/2019   Pulmonary artery hypertension (HCC) 05/21/2017   Pulmonary embolism (HCC)    Simple chronic bronchitis (HCC) 05/06/2017   Sinus tachycardia 08/01/2015   Thyroid disease    Vitamin D deficiency 12/16/2013    Past Surgical History:  Procedure Laterality Date   APPENDECTOMY     BACK SURGERY     CHOLECYSTECTOMY     COLONOSCOPY  02/27/2006   Dr Rayfield Citizen  Polyp in the hepatic flexure (polypectomy) Diverticulosis of the sigmoid colon.   ESOPHAGOGASTRODUODENOSCOPY  02/27/2006   Dr Rayfield Citizen  Normal EGD to descending duodenum.    OVARY SURGERY      Current  Medications: Current Meds  Medication Sig   acetaminophen (TYLENOL) 650 MG CR tablet Take 650 mg by mouth every 8 (eight) hours as needed for pain.   albuterol (PROAIR HFA) 108 (90 Base) MCG/ACT inhaler Inhale 2 puffs into the lungs every 6 (six) hours as needed for wheezing or shortness of breath.   apixaban (ELIQUIS) 5 MG TABS tablet Take 1 tablet (5 mg total) by mouth 2 (two) times daily.   ascorbic acid (VITAMIN C) 500 MG tablet Take 500 mg by mouth daily.   betamethasone dipropionate (DIPROLENE) 0.05 % ointment Apply 1 application topically 2 (two) times daily as needed (rash).   clotrimazole-betamethasone (LOTRISONE) cream  Apply 1 application topically in the morning and at bedtime. Unknown strength   fluticasone (FLONASE) 50 MCG/ACT nasal spray PLACE 2 SPRAYS INTO BOTH NOSTRILS AT BEDTIME AS NEEDED FOR ALLERGIES OR RHINITIS. (Patient taking differently: Place 2 sprays into both nostrils 2 (two) times daily.)   fluticasone (FLOVENT HFA) 110 MCG/ACT inhaler Inhale 2 puffs into the lungs 2 (two) times daily as needed (Cough, wheeze, chest congestion, or shortness of breath).   hydrochlorothiazide (MICROZIDE) 12.5 MG capsule Take 12.5 mg by mouth daily as needed for edema or fluid.   HYDROcodone-acetaminophen (NORCO/VICODIN) 5-325 MG tablet Take 1 tablet by mouth every 8 (eight) hours as needed for pain.   Lactobacillus (PROBIOTIC ACIDOPHILUS PO) Take 1 capsule by mouth daily. Unknown strenght   levothyroxine (SYNTHROID, LEVOTHROID) 50 MCG tablet Take 50 mcg by mouth daily.    loratadine (CLARITIN) 10 MG tablet Take 1 tablet (10 mg total) by mouth daily. (Patient taking differently: Take 10 mg by mouth 2 (two) times daily.)   meclizine (ANTIVERT) 25 MG tablet Take 25 mg by mouth 3 (three) times daily as needed for dizziness.   methocarbamol (ROBAXIN) 750 MG tablet Take 750 mg by mouth 3 (three) times daily as needed for muscle spasms.    metoprolol succinate (TOPROL-XL) 50 MG 24 hr tablet Take 1.5 tablets (75 mg total) by mouth daily. Take with or immediately following a meal.   montelukast (SINGULAIR) 10 MG tablet Take 1 tablet (10 mg total) by mouth at bedtime. (Patient taking differently: Take 10 mg by mouth as needed (Allergies).)   Omega-3 Fatty Acids (FISH OIL) 500 MG CAPS Take 1 capsule by mouth daily. Mega Red   pantoprazole (PROTONIX) 40 MG tablet TAKE 1 TABLET BY MOUTH EVERY DAY   Vitamin D, Ergocalciferol, (DRISDOL) 50000 units CAPS capsule Take 50,000 Units by mouth once a week.     Allergies:   Nsaids, Other, Prednisone, Risedronate, Adhesive [tape], Codeine, and Penicillins   Social History    Socioeconomic History   Marital status: Married    Spouse name: Not on file   Number of children: 3   Years of education: Not on file   Highest education level: Not on file  Occupational History   Not on file  Tobacco Use   Smoking status: Never   Smokeless tobacco: Never  Vaping Use   Vaping Use: Never used  Substance and Sexual Activity   Alcohol use: No   Drug use: No   Sexual activity: Not on file  Other Topics Concern   Not on file  Social History Narrative   Not on file   Social Determinants of Health   Financial Resource Strain: Not on file  Food Insecurity: Not on file  Transportation Needs: Not on file  Physical Activity: Not on file  Stress: Not on file  Social Connections: Not on file     Family History: The patient's family history includes Cancer in her mother; Colon cancer in her brother; Hypertension in her brother, mother, and sister. There is no history of Pulmonary disease, Pancreatic cancer, Stomach cancer, Esophageal cancer, or Liver disease. ROS:   Please see the history of present illness.    All 14 point review of systems negative except as described per history of present illness  EKGs/Labs/Other Studies Reviewed:      Recent Labs: No results found for requested labs within last 365 days.  Recent Lipid Panel No results found for: "CHOL", "TRIG", "HDL", "CHOLHDL", "VLDL", "LDLCALC", "LDLDIRECT"  Physical Exam:    VS:  BP 120/78 (BP Location: Right Arm, Patient Position: Sitting)   Pulse 100   Ht 5\' 6"  (1.676 m)   Wt 256 lb (116.1 kg)   SpO2 96%   BMI 41.32 kg/m     Wt Readings from Last 3 Encounters:  12/04/21 256 lb (116.1 kg)  05/23/21 252 lb (114.3 kg)  11/16/20 259 lb (117.5 kg)     GEN:  Well nourished, well developed in no acute distress HEENT: Normal NECK: No JVD; No carotid bruits LYMPHATICS: No lymphadenopathy CARDIAC: RRR, no murmurs, no rubs, no gallops RESPIRATORY:  Clear to auscultation without rales, wheezing  or rhonchi  ABDOMEN: Soft, non-tender, non-distended MUSCULOSKELETAL:  No edema; No deformity  SKIN: Warm and dry LOWER EXTREMITIES: no swelling NEUROLOGIC:  Alert and oriented x 3 PSYCHIATRIC:  Normal affect   ASSESSMENT:    1. Pulmonary artery hypertension (HCC)   2. OSA (obstructive sleep apnea)   3. Dyspnea on exertion   4. Precordial chest pain   5. Obesity (BMI 30-39.9)    PLAN:    In order of problems listed above:  Dyspnea on exertion she does have history of pulmonary hypertension we will need to recheck that on top of that she did have COVID-19 infection.  I think we need to do echocardiogram to assess left ventricle ejection fraction which I will ask her to have done. Obstructive sleep apnea: That being followed by internal medicine team. Pulmonary essential hypertension plan as described above however last echocardiogram done in January 2022 showed normal pulm artery pressure. Precordial chest pain, denies having any   Medication Adjustments/Labs and Tests Ordered: Current medicines are reviewed at length with the patient today.  Concerns regarding medicines are outlined above.  No orders of the defined types were placed in this encounter.  Medication changes: No orders of the defined types were placed in this encounter.   Signed, February 2022, MD, [facility] 12/04/2021 2:58 PM    [facility]

## 2021-12-16 ENCOUNTER — Ambulatory Visit (HOSPITAL_BASED_OUTPATIENT_CLINIC_OR_DEPARTMENT_OTHER)
Admission: RE | Admit: 2021-12-16 | Discharge: 2021-12-16 | Disposition: A | Discharge status: Home / Self Care | Payer: PPO | Source: Ambulatory Visit | Attending: Cardiology | Admitting: Cardiology

## 2021-12-16 DIAGNOSIS — R0609 Other forms of dyspnea: Secondary | ICD-10-CM

## 2021-12-16 NOTE — Progress Notes (Signed)
  Echocardiogram 2D Echocardiogram has been performed.  Gail Olson F 12/16/2021, 3:18 PM

## 2021-12-17 LAB — ECHOCARDIOGRAM COMPLETE
AR max vel: 2.83 cm2
AV Area VTI: 2.87 cm2
AV Area mean vel: 2.87 cm2
AV Mean grad: 4 mmHg
AV Peak grad: 8.1 mmHg
Ao pk vel: 1.42 m/s
Area-P 1/2: 4.33 cm2
S' Lateral: 3.6 cm

## 2021-12-23 ENCOUNTER — Telehealth: Payer: Self-pay

## 2021-12-23 NOTE — Telephone Encounter (Signed)
Results reviewed with pt as per Dr. Krasowski's note.  Pt verbalized understanding and had no additional questions. Routed to PCP  

## 2022-02-06 ENCOUNTER — Other Ambulatory Visit (HOSPITAL_BASED_OUTPATIENT_CLINIC_OR_DEPARTMENT_OTHER): Payer: Self-pay | Admitting: General Practice

## 2022-02-06 ENCOUNTER — Ambulatory Visit (HOSPITAL_BASED_OUTPATIENT_CLINIC_OR_DEPARTMENT_OTHER): Payer: PPO

## 2022-02-06 DIAGNOSIS — J0101 Acute recurrent maxillary sinusitis: Secondary | ICD-10-CM

## 2022-02-06 DIAGNOSIS — K0889 Other specified disorders of teeth and supporting structures: Secondary | ICD-10-CM

## 2022-02-27 ENCOUNTER — Other Ambulatory Visit: Payer: Self-pay

## 2022-02-27 ENCOUNTER — Other Ambulatory Visit: Payer: Self-pay | Admitting: Gastroenterology

## 2022-02-27 MED ORDER — PANTOPRAZOLE SODIUM 40 MG PO TBEC: 40.0000 mg | DELAYED_RELEASE_TABLET | Freq: Every day | ORAL | 0 refills | Status: AC

## 2022-04-01 ENCOUNTER — Other Ambulatory Visit: Payer: Self-pay | Admitting: Cardiology

## 2022-04-01 DIAGNOSIS — Z86711 Personal history of pulmonary embolism: Secondary | ICD-10-CM

## 2022-04-01 NOTE — Telephone Encounter (Signed)
Prescription refill request for Eliquis received. Indication:PE Last office visit: 12/04/21 Agustin Cree)  Scr: 0.90 (12/10/21)  Age: 72 Weight: 116.1kg  Appropriate dose. Refill sent.

## 2022-05-30 ENCOUNTER — Other Ambulatory Visit: Payer: Self-pay | Admitting: Cardiology

## 2022-05-30 ENCOUNTER — Other Ambulatory Visit: Payer: Self-pay | Admitting: Gastroenterology

## 2022-10-01 ENCOUNTER — Other Ambulatory Visit: Payer: Self-pay | Admitting: Cardiology

## 2022-10-01 DIAGNOSIS — Z86711 Personal history of pulmonary embolism: Secondary | ICD-10-CM

## 2022-10-01 NOTE — Telephone Encounter (Signed)
Prescription refill request for Eliquis received. Indication: DVT Last office visit: 12/04/21 Bing Matter)  Scr: 0.76 (05/01/22)  Age: 72 Weight: 116.1kg  Appropriate dose. Refill sent.

## 2022-11-27 ENCOUNTER — Other Ambulatory Visit: Payer: Self-pay | Admitting: Cardiology

## 2023-04-01 ENCOUNTER — Other Ambulatory Visit: Payer: Self-pay | Admitting: Cardiology

## 2023-04-01 DIAGNOSIS — Z86711 Personal history of pulmonary embolism: Secondary | ICD-10-CM

## 2023-04-01 NOTE — Telephone Encounter (Signed)
Eliquis 5mg  refill request received. Patient is 72 years old, weight-116.1kg, Crea-0.89 on 01/01/23 via [facility] Every[facility], Diagnosis-DVT, and last seen by Dr. Bing Matter on 12/04/21-pt needs an appointment. Dose is appropriate based on dosing criteria.   Pt needs an appt with Cardiologist. Will send a message to Scheduler.

## 2023-04-03 ENCOUNTER — Encounter: Payer: Self-pay | Admitting: Cardiology

## 2023-04-03 ENCOUNTER — Ambulatory Visit: Payer: PPO | Attending: Cardiology | Admitting: Cardiology

## 2023-04-03 VITALS — BP 106/64 | HR 89 | Ht 66.0 in | Wt 237.0 lb

## 2023-04-03 DIAGNOSIS — R072 Precordial pain: Secondary | ICD-10-CM

## 2023-04-03 DIAGNOSIS — R002 Palpitations: Secondary | ICD-10-CM | POA: Diagnosis not present

## 2023-04-03 DIAGNOSIS — R0609 Other forms of dyspnea: Secondary | ICD-10-CM | POA: Diagnosis not present

## 2023-04-03 DIAGNOSIS — Z86711 Personal history of pulmonary embolism: Secondary | ICD-10-CM

## 2023-04-03 DIAGNOSIS — I825Y2 Chronic embolism and thrombosis of unspecified deep veins of left proximal lower extremity: Secondary | ICD-10-CM

## 2023-04-03 NOTE — Progress Notes (Signed)
Cardiology Office Note:    Date:  04/03/2023   ID:  Gail Olson, DOB 03/05/1950, MRN [id number]  PCP:  Shellia Cleverly, PA  Cardiologist:  Gypsy Balsam, MD    Referring MD: Shellia Cleverly, [place]   Chief Complaint  Patient presents with   Follow-up    History of Present Illness:    Gail Olson is a 73 y.o. female with past medical history significant for pulmonary hypertension, dyspnea on exertion, atypical chest pain, history of DVT.  Comes today to months for follow-up she has been struggling with cold-like symptoms since December.  Went through multiple courses of antibiotic complaining of being weak tired exhausted but otherwise no palpitations dizziness swelling of lower extremities  Past Medical History:  Diagnosis Date   Age-related osteoporosis without current pathological fracture 12/16/2013   Formatting of this note might be different from the original. Overview:  Due to history of fracture with the background low bone mass (osteopenia)   Arthritis    Atypical chest pain 11/01/2015   Chest pain 03/18/2017   Chronic deep vein thrombosis (DVT) of proximal vein of left lower extremity (HCC) 03/16/2019   DVT (deep venous thrombosis) (HCC)    Dyspnea on exertion 08/01/2015   Elevated blood-pressure reading without diagnosis of hypertension 05/24/2014   Overview:  IMOUPDATE   Gastroesophageal reflux disease without esophagitis 05/24/2014   Overview:  Stable on Prilosec. Overview:  Overview:  Stable on Prilosec.  Formatting of this note might be different from the original. Stable on Prilosec. Formatting of this note might be different from the original. Overview:  Stable on Prilosec.  Overview:  Overview:  Stable on Prilosec. Overview:  Overview:  Stable on Prilosec.   History of pulmonary embolism 07/30/2018   Hypothyroidism 02/26/2017   Lichen sclerosus et atrophicus 12/16/2013   Nodule of right lung 10/25/2015   Overview:  Per cxr done at [facility] on Aug 24/2017  RUL  Overview:   Overview:  Per cxr done at [facility] on Aug 24/2017  RUL   Formatting of this note might be different from the original. Per cxr done at [facility] on Aug 24/2017  RUL Formatting of this note might be different from the original. Overview:  Per cxr done at [facility] on Aug 24/2017  RUL   Overview:  Overview:  Per cxr done at [facility] on Aug 24/2017  RUL  Overview:   Obesity (BMI 30-39.9) 04/20/2018   OSA (obstructive sleep apnea) 05/06/2017   ONO with RA 04/27/17 >> test time 8 hrs 21 min.  Basal SpO2 92%, low SpO2 83%.  Spent 18 min with SpO2 < 88%.  ONO shows intermittent oxygen desaturation, and pattern is concerning for sleep apnea.    Palpitations 11/01/2015   Precordial chest pain 10/07/2017   Primary hypercoagulable state (HCC) 03/16/2019   Pulmonary artery hypertension (HCC) 05/21/2017   Pulmonary embolism (HCC)    Simple chronic bronchitis (HCC) 05/06/2017   Sinus tachycardia 08/01/2015   Thyroid disease    Vitamin D deficiency 12/16/2013    Past Surgical History:  Procedure Laterality Date   APPENDECTOMY     BACK SURGERY     CHOLECYSTECTOMY     COLONOSCOPY  02/27/2006   Dr Rayfield Citizen  Polyp in the hepatic flexure (polypectomy) Diverticulosis of the sigmoid colon.   ESOPHAGOGASTRODUODENOSCOPY  02/27/2006   Dr Rayfield Citizen  Normal EGD to descending duodenum.    OVARY SURGERY      Current Medications: Current Meds  Medication Sig  acetaminophen (TYLENOL) 650 MG CR tablet Take 650 mg by mouth every 8 (eight) hours as needed for pain.   albuterol (PROAIR HFA) 108 (90 Base) MCG/ACT inhaler Inhale 2 puffs into the lungs every 6 (six) hours as needed for wheezing or shortness of breath.   ascorbic acid (VITAMIN C) 500 MG tablet Take 500 mg by mouth daily.   betamethasone dipropionate (DIPROLENE) 0.05 % ointment Apply 1 application topically 2 (two) times daily as needed (rash).   clotrimazole-betamethasone (LOTRISONE) cream Apply 1 application topically in the morning and at bedtime. Unknown strength   ELIQUIS 5 MG TABS tablet  TAKE 1 TABLET BY MOUTH 2 TIMES A DAY   fluticasone (FLONASE) 50 MCG/ACT nasal spray PLACE 2 SPRAYS INTO BOTH NOSTRILS AT BEDTIME AS NEEDED FOR ALLERGIES OR RHINITIS. (Patient taking differently: Place 2 sprays into both nostrils 2 (two) times daily.)   fluticasone (FLOVENT HFA) 110 MCG/ACT inhaler Inhale 2 puffs into the lungs 2 (two) times daily as needed (Cough, wheeze, chest congestion, or shortness of breath).   hydrochlorothiazide (MICROZIDE) 12.5 MG capsule Take 12.5 mg by mouth daily as needed for edema or fluid.   HYDROcodone-acetaminophen (NORCO/VICODIN) 5-325 MG tablet Take 1 tablet by mouth every 8 (eight) hours as needed for pain.   Lactobacillus (PROBIOTIC ACIDOPHILUS PO) Take 1 capsule by mouth daily. Unknown strenght   levothyroxine (SYNTHROID, LEVOTHROID) 50 MCG tablet Take 50 mcg by mouth daily.    loratadine (CLARITIN) 10 MG tablet Take 1 tablet (10 mg total) by mouth daily. (Patient taking differently: Take 10 mg by mouth 2 (two) times daily.)   meclizine (ANTIVERT) 25 MG tablet Take 25 mg by mouth 3 (three) times daily as needed for dizziness.   methocarbamol (ROBAXIN) 750 MG tablet Take 750 mg by mouth 3 (three) times daily as needed for muscle spasms.    metoprolol succinate (TOPROL-XL) 50 MG 24 hr tablet TAKE 1 AND 1/2 TABLETS BY MOUTH DAILY   montelukast (SINGULAIR) 10 MG tablet Take 1 tablet (10 mg total) by mouth at bedtime. (Patient taking differently: Take 10 mg by mouth as needed (Allergies).)   Omega-3 Fatty Acids (FISH OIL) 500 MG CAPS Take 1 capsule by mouth daily. Mega Red   pantoprazole (PROTONIX) 40 MG tablet Take 1 tablet (40 mg total) by mouth daily. Please call [phone] to schedule an office visit more refills   Vitamin D, Ergocalciferol, (DRISDOL) 50000 units CAPS capsule Take 50,000 Units by mouth once a week.     Allergies:   Nsaids, Other, Prednisone, Risedronate, Adhesive [tape], Codeine, and Penicillins   Social History   Socioeconomic History    Marital status: Married    Spouse name: Not on file   Number of children: 3   Years of education: Not on file   Highest education level: Not on file  Occupational History   Not on file  Tobacco Use   Smoking status: Never   Smokeless tobacco: Never  Vaping Use   Vaping status: Never Used  Substance and Sexual Activity   Alcohol use: No   Drug use: No   Sexual activity: Not on file  Other Topics Concern   Not on file  Social History Narrative   Not on file   Social Drivers of Health   Financial Resource Strain: Unknown (05/10/2020)   [facility] from [facility] prior to 05/03/2022., [facility] visits prior to 05/03/2022.   Overall Financial Resource Strain (CARDIA)    Difficulty  of Paying Living Expenses: Patient refused  Food Insecurity: Low Risk  (05/01/2022)   [facility]   Hunger Vital Sign    Worried About Running Out of Food in the Last Year: Never true    Within the past 12 months, the food you bought just didn't last and you didn't have money to get more: Not on file  Transportation Needs: No Transportation Needs (05/01/2022)   Received from [facility] visits prior to 05/03/2022., [facility] visits prior to 05/03/2022.   Transportation    In the past 12 months, has lack of reliable transportation kept you from medical appointments, meetings, work or from getting things needed for daily living?: No  Physical Activity: Not on file  Stress: Not on file  Social Connections: Unknown (12/09/2021)   [facility]   Social Connection and Isolation Panel [NHANES]    Frequency of Communication with Friends and Family: Patient declined    Frequency of Social Gatherings with Friends and Family: Patient declined    Attends Religious Services: Patient declined    Database administrator or Organizations: Not on file    Attends Tax inspector Meetings: Patient declined    Marital Status: Patient declined     Family History: The patient's family history includes Cancer in her mother; Colon cancer in her brother; Hypertension in her brother, mother, and sister. There is no history of Pulmonary disease, Pancreatic cancer, Stomach cancer, Esophageal cancer, or Liver disease. ROS:   Please see the history of present illness.    All 14 point review of systems negative except as described per history of present illness  EKGs/Labs/Other Studies Reviewed:         Recent Labs: No results found for requested labs within last 365 days.  Recent Lipid Panel No results found for: "CHOL", "TRIG", "HDL", "CHOLHDL", "VLDL", "LDLCALC", "LDLDIRECT"  Physical Exam:    VS:  BP 106/64 (BP Location: Right Arm, Patient Position: Sitting)   Pulse 89   Ht 5\' 6"  (1.676 m)   Wt 237 lb (107.5 kg)   SpO2 94%   BMI 38.25 kg/m     Wt Readings from Last 3 Encounters:  04/03/23 237 lb (107.5 kg)  12/04/21 256 lb (116.1 kg)  05/23/21 252 lb (114.3 kg)     GEN:  Well nourished, well developed in no acute distress HEENT: Normal NECK: No JVD; No carotid bruits LYMPHATICS: No lymphadenopathy CARDIAC: RRR, no murmurs, no rubs, no gallops RESPIRATORY:  Clear to auscultation without rales, wheezing or rhonchi  ABDOMEN: Soft, non-tender, non-distended MUSCULOSKELETAL:  No edema; No deformity  SKIN: Warm and dry LOWER EXTREMITIES: no swelling NEUROLOGIC:  Alert and oriented x 3 PSYCHIATRIC:  Normal affect   ASSESSMENT:    1. Palpitations   2. History of pulmonary embolism   3. Dyspnea on exertion   4. Chronic deep vein thrombosis (DVT) of proximal vein of left lower extremity (HCC)    PLAN:    In order of problems listed above:  Palpitations denies having any overall seems stable from that point review. History of pulmonary emboli, she is anticoagulated which I will continue I will schedule her to have another echocardiogram  that is 43 that she is having a lot of shortness of breath right now I suspect this is related to pulmonary issue but I want a make sure were not missing any cardiac complications. Chronic DVT: Anticoagulation. Dyslipidemia  I did review K PN which show LDL at 112 HDL 56 this was done when she was sick I will wait about 3 months and repeat this test to decide about potential therapy. Coronary disease only minimal based on coronary CT angio   Medication Adjustments/Labs and Tests Ordered: Current medicines are reviewed at length with the patient today.  Concerns regarding medicines are outlined above.  Orders Placed This Encounter  Procedures   EKG 12-Lead   Medication changes: No orders of the defined types were placed in this encounter.   Signed, Georgeanna Lea, MD, [facility] 04/03/2023 3:10 PM    [facility]

## 2023-04-03 NOTE — Addendum Note (Signed)
Addended by: Baldo Ash D on: 04/03/2023 03:22 PM   Modules accepted: Orders

## 2023-04-03 NOTE — Patient Instructions (Addendum)
Medication Instructions:  Your physician recommends that you continue on your current medications as directed. Please refer to the Current Medication list given to you today.  *If you need a refill on your cardiac medications before your next appointment, please call your pharmacy*   Lab Work: 3rd Floor   [address]  Your physician recommends that you return for lab work in:   3 months You need to have labs done when you are fasting.  You can come Monday through Friday 8:00 am to 11:30AM and 1:00 to 4:00. You do not need to make an appointment as the order has already been placed. The labs you are going to have done are Lipid      Testing/Procedures: Your physician has requested that you have an echocardiogram. Echocardiography is a painless test that uses sound waves to create images of your heart. It provides your doctor with information about the size and shape of your heart and how well your heart's chambers and valves are working. This procedure takes approximately one hour. There are no restrictions for this procedure. Please do NOT wear cologne, perfume, aftershave, or lotions (deodorant is allowed). Please arrive 15 minutes prior to your appointment time.  Please note: We ask at that you not bring children with you during ultrasound (echo/ vascular) testing. Due to room size and safety concerns, children are not allowed in the ultrasound rooms during exams. Our front office staff cannot provide observation of children in our lobby area while testing is being conducted. An adult accompanying a patient to their appointment will only be allowed in the ultrasound room at the discretion of the ultrasound technician under special circumstances. We apologize for any inconvenience.    Follow-Up: At [facility], you and your health needs are our priority.  As part of our continuing mission to provide you with exceptional heart care, we have created designated Provider Care Teams.  These Care  Teams include your primary Cardiologist (physician) and Advanced Practice Providers (APPs -  Physician Assistants and Nurse Practitioners) who all work together to provide you with the care you need, when you need it.  We recommend signing up for the patient portal called "MyChart".  Sign up information is provided on this After Visit Summary.  MyChart is used to connect with patients for Virtual Visits (Telemedicine).  Patients are able to view lab/test results, encounter notes, upcoming appointments, etc.  Non-urgent messages can be sent to your provider as well.   To learn more about what you can do with MyChart, go to [email or web address].    Your next appointment:   6 month(s)  The format for your next appointment:   In Person  Provider:   Gypsy Balsam, MD    Other Instructions NA

## 2023-04-06 ENCOUNTER — Ambulatory Visit (HOSPITAL_BASED_OUTPATIENT_CLINIC_OR_DEPARTMENT_OTHER): Payer: PPO

## 2023-04-27 ENCOUNTER — Ambulatory Visit (HOSPITAL_BASED_OUTPATIENT_CLINIC_OR_DEPARTMENT_OTHER): Payer: PPO

## 2023-05-28 ENCOUNTER — Other Ambulatory Visit: Payer: Self-pay | Admitting: Cardiology

## 2023-06-01 ENCOUNTER — Ambulatory Visit (HOSPITAL_BASED_OUTPATIENT_CLINIC_OR_DEPARTMENT_OTHER)
Admission: RE | Admit: 2023-06-01 | Discharge: 2023-06-01 | Disposition: A | Discharge status: Home / Self Care | Payer: PPO | Source: Ambulatory Visit | Attending: Cardiology | Admitting: Cardiology

## 2023-06-01 DIAGNOSIS — R0609 Other forms of dyspnea: Secondary | ICD-10-CM

## 2023-06-01 LAB — ECHOCARDIOGRAM COMPLETE
AR max vel: 1.56 cm2
AV Area VTI: 1.62 cm2
AV Area mean vel: 1.66 cm2
AV Mean grad: 4 mmHg
AV Peak grad: 7.5 mmHg
Ao pk vel: 1.37 m/s
Area-P 1/2: 3.89 cm2
Calc EF: 62.8 %
MV M vel: 3.69 m/s
MV Peak grad: 54.5 mmHg
S' Lateral: 2.9 cm
Single Plane A2C EF: 60 %
Single Plane A4C EF: 64.5 %

## 2023-06-10 ENCOUNTER — Telehealth: Payer: Self-pay

## 2023-06-10 NOTE — Telephone Encounter (Signed)
 Left message on My Chart with Echo results per Dr. Vanetta Shawl note. Routed to PCP.

## 2023-09-30 ENCOUNTER — Other Ambulatory Visit: Payer: Self-pay | Admitting: Cardiology

## 2023-09-30 DIAGNOSIS — Z86711 Personal history of pulmonary embolism: Secondary | ICD-10-CM

## 2023-09-30 NOTE — Telephone Encounter (Signed)
 Prescription refill request for Eliquis  received. Indication: DVT Last office visit: 04/03/23 [place])  Scr: 0.89 (01/01/23)  Age: 73 Weight: 107.5kg  Appropriate dose. Refill sent.

## 2024-02-25 ENCOUNTER — Other Ambulatory Visit: Payer: Self-pay | Admitting: Cardiology

## 2024-03-21 ENCOUNTER — Other Ambulatory Visit: Payer: Self-pay | Admitting: Cardiology

## 2024-03-28 ENCOUNTER — Other Ambulatory Visit: Payer: Self-pay | Admitting: Cardiology

## 2024-03-28 DIAGNOSIS — Z86711 Personal history of pulmonary embolism: Secondary | ICD-10-CM

## 2024-05-27 ENCOUNTER — Ambulatory Visit: Admitting: Cardiology
